# Patient Record
Sex: Male | Born: 1965 | Hispanic: Yes | Marital: Married | Smoking: Never smoker
Health system: Southern US, Community
[De-identification: ages and names within clinical notes are randomized; demographics above are authoritative.]

## PROBLEM LIST (undated history)

## (undated) HISTORY — PX: APPENDECTOMY: SHX54

---

## 2021-01-20 ENCOUNTER — Telehealth: Payer: Self-pay | Admitting: *Deleted

## 2021-01-20 NOTE — Telephone Encounter (Signed)
Per referral 01/16/21 - gave patient upcoming appts. - mailed calendar and welcome packet  -due to patient having surgery soon, moved appointment out a couple weeks.

## 2021-01-21 ENCOUNTER — Telehealth: Payer: Self-pay | Admitting: *Deleted

## 2021-01-21 ENCOUNTER — Other Ambulatory Visit: Payer: Self-pay | Admitting: Family

## 2021-01-21 DIAGNOSIS — D649 Anemia, unspecified: Secondary | ICD-10-CM

## 2021-01-21 DIAGNOSIS — D696 Thrombocytopenia, unspecified: Secondary | ICD-10-CM

## 2021-01-21 NOTE — Telephone Encounter (Signed)
Patient called per Thomas Frazier of upcoming appt. Patient confirmed

## 2021-01-22 ENCOUNTER — Other Ambulatory Visit: Payer: Self-pay | Admitting: Family

## 2021-01-22 ENCOUNTER — Telehealth: Payer: Self-pay | Admitting: Family

## 2021-01-22 ENCOUNTER — Inpatient Hospital Stay: Payer: Self-pay

## 2021-01-22 ENCOUNTER — Inpatient Hospital Stay: Payer: Self-pay | Attending: Family | Admitting: Family

## 2021-01-22 ENCOUNTER — Encounter: Payer: Self-pay | Admitting: Family

## 2021-01-22 ENCOUNTER — Other Ambulatory Visit: Payer: Self-pay

## 2021-01-22 VITALS — BP 128/62 | HR 80 | Temp 98.7°F | Resp 18 | Ht 69.0 in | Wt 167.0 lb

## 2021-01-22 DIAGNOSIS — D649 Anemia, unspecified: Secondary | ICD-10-CM

## 2021-01-22 DIAGNOSIS — D696 Thrombocytopenia, unspecified: Secondary | ICD-10-CM

## 2021-01-22 LAB — CMP (CANCER CENTER ONLY)
ALT: 25 U/L (ref 0–44)
AST: 26 U/L (ref 15–41)
Albumin: 4.5 g/dL (ref 3.5–5.0)
Alkaline Phosphatase: 86 U/L (ref 38–126)
Anion gap: 6 (ref 5–15)
BUN: 22 mg/dL — ABNORMAL HIGH (ref 6–20)
CO2: 29 mmol/L (ref 22–32)
Calcium: 10.3 mg/dL (ref 8.9–10.3)
Chloride: 102 mmol/L (ref 98–111)
Creatinine: 0.9 mg/dL (ref 0.61–1.24)
GFR, Estimated: >60 mL/min (ref >60)
Glucose, Bld: 100 mg/dL — ABNORMAL HIGH (ref 70–99)
Potassium: 4 mmol/L (ref 3.5–5.1)
Sodium: 137 mmol/L (ref 135–145)
Total Bilirubin: 0.7 mg/dL (ref 0.3–1.2)
Total Protein: 7.9 g/dL (ref 6.5–8.1)

## 2021-01-22 LAB — CBC WITH DIFFERENTIAL (CANCER CENTER ONLY)
Abs Immature Granulocytes: 0.04 10*3/uL (ref 0.00–0.07)
Basophils Absolute: 0 10*3/uL (ref 0.0–0.1)
Basophils Relative: 1 %
Eosinophils Absolute: 0.1 10*3/uL (ref 0.0–0.5)
Eosinophils Relative: 1 %
HCT: 27.5 % — ABNORMAL LOW (ref 39.0–52.0)
Hemoglobin: 8.9 g/dL — ABNORMAL LOW (ref 13.0–17.0)
Immature Granulocytes: 1 %
Lymphocytes Relative: 57 %
Lymphs Abs: 3.3 10*3/uL (ref 0.7–4.0)
MCH: 27.7 pg (ref 26.0–34.0)
MCHC: 32.4 g/dL (ref 30.0–36.0)
MCV: 85.7 fL (ref 80.0–100.0)
Monocytes Absolute: 0.8 10*3/uL (ref 0.1–1.0)
Monocytes Relative: 13 %
Neutro Abs: 1.6 10*3/uL — ABNORMAL LOW (ref 1.7–7.7)
Neutrophils Relative %: 27 %
Platelet Count: 54 10*3/uL — ABNORMAL LOW (ref 150–400)
RBC: 3.21 MIL/uL — ABNORMAL LOW (ref 4.22–5.81)
RDW: 28.6 % — ABNORMAL HIGH (ref 11.5–15.5)
WBC Count: 5.8 10*3/uL (ref 4.0–10.5)
nRBC: 42.3 % — ABNORMAL HIGH (ref 0.0–0.2)

## 2021-01-22 LAB — RETICULOCYTES
Immature Retic Fract: 12.4 % (ref 2.3–15.9)
RBC.: 3.1 MIL/uL — ABNORMAL LOW (ref 4.22–5.81)
Retic Count, Absolute: 118 10*3/uL (ref 19.0–186.0)
Retic Ct Pct: 3.8 % — ABNORMAL HIGH (ref 0.4–3.1)

## 2021-01-22 LAB — IRON AND TIBC
Iron: 117 ug/dL (ref 42–163)
Saturation Ratios: 44 % (ref 20–55)
TIBC: 268 ug/dL (ref 202–409)
UIBC: 151 ug/dL (ref 117–376)

## 2021-01-22 LAB — SAVE SMEAR(SSMR), FOR PROVIDER SLIDE REVIEW

## 2021-01-22 LAB — VITAMIN B12: Vitamin B-12: 607 pg/mL (ref 180–914)

## 2021-01-22 LAB — FERRITIN: Ferritin: 445 ng/mL — ABNORMAL HIGH (ref 24–336)

## 2021-01-22 LAB — FOLATE: Folate: 32 ng/mL (ref >5.9)

## 2021-01-22 LAB — LACTATE DEHYDROGENASE: LDH: 430 U/L — ABNORMAL HIGH (ref 98–192)

## 2021-01-22 LAB — PLATELET BY CITRATE

## 2021-01-22 NOTE — Telephone Encounter (Signed)
Called and LMVM for patient regarding appointments scheduled for 3/16 per 2/23 los

## 2021-01-22 NOTE — Progress Notes (Signed)
Hematology/Oncology Consultation   Name: Thomas Frazier      MRN: [id number]    Location: Room/bed info not found  Date: 01/22/2021 Time:9:56 AM   REFERRING PHYSICIAN: Brantley Stage, PA-C  REASON FOR CONSULT: Anemia unspecified   DIAGNOSIS: Anemia and thrombocytopenia   HISTORY OF PRESENT ILLNESS: Thomas Frazier is a very pleasant 55 yo Hispanic gentleman with recent anemia and thrombocytopenia on routine blood work prior to planned inguinal hernia repair.  Platelets were 60, Hgb 10.4, MCV 89 and WBC count 7.3.  Hgb today is 8.9, MCV 85, platelets 54 and WBC count 5.8.  He denies fatigue.  He has not noted any blood loss. No bruising or petechiae.  No known family history of blood abnormalities or cancer.  No known liver or spleen issues.  He states that he does not sleep well through the night.  No fever, chills, n/v, cough, rash, dizziness, SOB, chest pain, palpitations, abdominal pain/bloating or changes in bowel or bladder habits.  No adenopathy noted on exam.  No hot flashes or night sweats.  He has occasional pain in the back and in his sides which he states is exacerbated by the cold weather. He has occasional positional numbness and tingling in his right fingers that resolves with movement.  No swelling in his extremities.  No falls or syncope to report.  He does not smoke or use recreational drugs.  He states that he rarely has a small glass of wine.  He states that his appetite comes and goes. He does not eat pork. He mostly eats chicken or fish with vegetables. He is doing his best to stay well hydrated with orange juice but admits that he needs more water throughout the day. His weight is stable.  He is prediabetic and is addressing with the above dietary changes and some exercise.  No history of thyroid disease.  He is originally from [place] and has been living the states since the 1990's.  He has 3 sons and live at home with his wife and youngest son.  He works as an  Clinical biochemist.    ROS: All other 10 point review of systems is negative.   PAST MEDICAL HISTORY:   No past medical history on file.  ALLERGIES: Not on File    MEDICATIONS:  No current outpatient medications on file prior to visit.   No current facility-administered medications on file prior to visit.     PAST SURGICAL HISTORY DIscussed with patient.  FAMILY HISTORY: No family history on file.  SOCIAL HISTORY:  has no history on file for tobacco use, alcohol use, and drug use.  PERFORMANCE STATUS: The patient's performance status is 1 - Symptomatic but completely ambulatory  PHYSICAL EXAM: Most Recent Vital Signs: There were no vitals taken for this visit. There were no vitals taken for this visit.  General Appearance:    Alert, cooperative, no distress, appears stated age  Head:    Normocephalic, without obvious abnormality, atraumatic  Eyes:    PERRL, conjunctiva/corneas clear, EOM's intact, fundi    benign, both eyes        Throat:   Lips, mucosa, and tongue normal; teeth and gums normal  Neck:   Supple, symmetrical, trachea midline, no adenopathy;    thyroid:  no enlargement/tenderness/nodules; no carotid   bruit or JVD  Back:     Symmetric, no curvature, ROM normal, no CVA tenderness  Lungs:     Clear to auscultation bilaterally, respirations unlabored  Chest Wall:  No tenderness or deformity   Heart:    Regular rate and rhythm, S1 and S2 normal, no murmur, rub   or gallop     Abdomen:     Soft, non-tender, bowel sounds active all four quadrants,    no masses, no organomegaly        Extremities:   Extremities normal, atraumatic, no cyanosis or edema  Pulses:   2+ and symmetric all extremities  Skin:   Skin color, texture, turgor normal, no rashes or lesions  Lymph nodes:   Cervical, supraclavicular, and axillary nodes normal  Neurologic:   CNII-XII intact, normal strength, sensation and reflexes    throughout    LABORATORY DATA:  No results found for  this or any previous visit (from the past 48 hour(s)).    RADIOGRAPHY: No results found.     PATHOLOGY: None   ASSESSMENT/PLAN: Thomas Frazier is a very pleasant 55 yo Hispanic gentleman with recent anemia and thrombocytopenia on routine blood work prior to planned inguinal hernia repair.  Blood smear was reviewed by Dr. Marin Olp and many nucleated red blood cells were noted. At this time we will get him set up for bone marrow biopsy within the week as well as abdominal US to assess the liver and spleen. Surgery is on hold for now.  Patient understands and is in agreement with the plan.  Once results are available, we will determine plan of care.   All questions were answered. He was encouraged to contact our office with any questions or concerns. We can certainly see him sooner if needed.   He was discussed with and also seen by Dr. Marin Olp and he is in agreement with the aforementioned.   Laverna Peace, NP

## 2021-01-23 ENCOUNTER — Ambulatory Visit (HOSPITAL_BASED_OUTPATIENT_CLINIC_OR_DEPARTMENT_OTHER)
Admission: RE | Admit: 2021-01-23 | Discharge: 2021-01-23 | Disposition: A | Discharge status: Home / Self Care | Payer: Self-pay | Source: Ambulatory Visit | Attending: Family | Admitting: Family

## 2021-01-23 DIAGNOSIS — D649 Anemia, unspecified: Secondary | ICD-10-CM

## 2021-01-23 DIAGNOSIS — D696 Thrombocytopenia, unspecified: Secondary | ICD-10-CM

## 2021-01-23 LAB — ERYTHROPOIETIN: Erythropoietin: 20.9 m[IU]/mL — ABNORMAL HIGH (ref 2.6–18.5)

## 2021-01-23 NOTE — Progress Notes (Signed)
Received message to add for bone marrow with Wilber Bihari, NP for 2/25. Donita Brooks called patient and instructed to arrive at [facility] at [facility] for 0730 appt. Flow Cytometry notified.

## 2021-01-24 ENCOUNTER — Other Ambulatory Visit: Payer: Self-pay

## 2021-01-24 ENCOUNTER — Inpatient Hospital Stay: Payer: Self-pay

## 2021-01-24 ENCOUNTER — Inpatient Hospital Stay (HOSPITAL_BASED_OUTPATIENT_CLINIC_OR_DEPARTMENT_OTHER): Payer: Self-pay | Admitting: Adult Health

## 2021-01-24 VITALS — BP 108/52 | HR 79 | Temp 97.9°F | Resp 18

## 2021-01-24 DIAGNOSIS — D696 Thrombocytopenia, unspecified: Secondary | ICD-10-CM

## 2021-01-24 LAB — CBC WITH DIFFERENTIAL (CANCER CENTER ONLY)
Abs Immature Granulocytes: 0.04 10*3/uL (ref 0.00–0.07)
Basophils Absolute: 0 10*3/uL (ref 0.0–0.1)
Basophils Relative: 1 %
Eosinophils Absolute: 0.1 10*3/uL (ref 0.0–0.5)
Eosinophils Relative: 2 %
HCT: 25.6 % — ABNORMAL LOW (ref 39.0–52.0)
Hemoglobin: 8.2 g/dL — ABNORMAL LOW (ref 13.0–17.0)
Immature Granulocytes: 1 %
Lymphocytes Relative: 52 %
Lymphs Abs: 2.5 10*3/uL (ref 0.7–4.0)
MCH: 27.8 pg (ref 26.0–34.0)
MCHC: 32 g/dL (ref 30.0–36.0)
MCV: 86.8 fL (ref 80.0–100.0)
Monocytes Absolute: 0.7 10*3/uL (ref 0.1–1.0)
Monocytes Relative: 15 %
Neutro Abs: 1.3 10*3/uL — ABNORMAL LOW (ref 1.7–7.7)
Neutrophils Relative %: 29 %
Platelet Count: 47 10*3/uL — ABNORMAL LOW (ref 150–400)
RBC: 2.95 MIL/uL — ABNORMAL LOW (ref 4.22–5.81)
RDW: 29.6 % — ABNORMAL HIGH (ref 11.5–15.5)
WBC Count: 4.6 10*3/uL (ref 4.0–10.5)
nRBC: 39 % — ABNORMAL HIGH (ref 0.0–0.2)

## 2021-01-24 LAB — HGB FRACTIONATION CASCADE
Hgb A2: 2.3 % (ref 1.8–3.2)
Hgb A: 97.1 % (ref 96.4–98.8)
Hgb F: 0.6 % (ref 0.0–2.0)
Hgb S: 0 %

## 2021-01-24 MED ORDER — LIDOCAINE HCL 2 % IJ SOLN
INTRAMUSCULAR | Status: AC
  Filled 2021-01-24: qty 20

## 2021-01-24 NOTE — Progress Notes (Signed)
Upon departure of infusion clinic, vitals were stable and the dressing site was clean, dry and intact. Patient was ambulatory and in no distress.

## 2021-01-24 NOTE — Patient Instructions (Signed)
Aspiracin y biopsia de mdula sea en adultos, cuidados posteriores Bone Marrow Aspiration and Bone Marrow Biopsy, Adult, Care After Esta hoja le brinda informacin sobre cmo cuidarse despus del procedimiento. Su mdico tambin podr darle instrucciones ms especficas. Comunquese con el mdico si tiene problemas o preguntas. Qu puedo esperar despus del procedimiento? Despus del procedimiento, es comn [employer] siguientes sntomas:  Dolor leve y sensibilidad.  Hinchazn.  Moretones. Siga estas instrucciones en su casa: Cuidado del lugar de la puncin  Siga las instrucciones del mdico acerca del cuidado del lugar de la puncin. Asegrese de hacer lo siguiente: ? Lvese las manos con agua y [place] antes y despus de cambiar la venda (vendaje). Use desinfectante para manos si no dispone de [place] y [place]. ? Cambie el vendaje como se lo haya indicado el mdico.  Psychiatric nurse de la puncin todos los das para descartar signos de infeccin. Est atento a los siguientes signos: ? Aumento del enrojecimiento, la hinchazn o Conservation officer, historic buildings. ? Lquido o sangre. ? Calor. ? Pus o mal olor.   Actividad  Reanude sus actividades normales segn lo indicado por el mdico. Pregntele al mdico qu actividades son seguras para usted.  No levante ningn objeto que pese ms de 10libras (4,5kg) o que supere el lmite de peso que le hayan indicado, hasta que el mdico le diga que puede [place].  No conduzca durante 24horas si le administraron un sedante durante el procedimiento. Instrucciones generales  [employer] de venta libre y los recetados solamente como se lo haya indicado el mdico.  No tome baos de inmersin, no nade ni use el jacuzzi hasta que el mdico lo autorice. Pregntele al mdico si puede ducharse. Thurston Pounds solo le permitan darse baos de [place].  Si se lo indican, aplique hielo sobre la zona afectada. Para hacer esto: ? Ponga el hielo en una bolsa  plstica. ? Coloque una [employer] piel y Therapist, nutritional. ? Coloque el hielo durante 54mnutos, 2 a 3veces por da.  Concurra a todas las visitas de seguimiento como se lo haya indicado el mdico. Esto es importante.   Comunquese con un mdico si:  El dolor no se alivia con los m[employer]ynegy  Tiene fiebre.  Aumentan el enrojecimiento, la hinchazn o eConservation officer, historic buildingsalrededor del lEnvironmental consultantde la puncin.  Observa lquido o sangre que salen del lugar de la puncin.  El lugar de la puncin se siente caliente al tacto.  Tiene pus o percibe que sale mal olor del lugar de la puncin. Resumen  Despus del procedimiento, es normal tener dolor leve, sensibilidad, hinchazn y moretones.  Siga las instrucciones del mdico acerca de cmo cuidar el lugar de la puncin y qSander Nephewactividades son seguras para usted.  Tome los medicamentos de venta libre y los recetados solamente como se lo haya indicado el mdico.  Comunquese con un mdico si tiene algn signo de infeccin, como lquido o sangre que salen del lugar de la puncin. Esta informacin no tiene cMarine scientistel consejo del mdico. Asegrese de hacerle al mdico cualquier pregunta que tenga. Document Revised: 05/24/2019 Document Reviewed: 05/24/2019 Elsevier Patient Education  2[place]

## 2021-01-24 NOTE — Progress Notes (Signed)
INDICATION: thrombocytopenia  Brief examination was performed. ENT: adequate airway clearance Heart: regular rate and rhythm.No Murmurs Lungs: clear to auscultation, no wheezes, normal respiratory effort  Bone Marrow Biopsy and Aspiration Procedure Note   Informed consent was obtained and potential risks including bleeding, infection and pain were reviewed with the patient.  The patient's name, date of birth, identification, consent and allergies were verified prior to the start of procedure and time out was performed.  The left posterior iliac crest was chosen as the site of biopsy.  The skin was prepped with ChloraPrep.   8 cc of 2% lidocaine was used to provide local anaesthesia.   10 cc of bone marrow aspirate was obtained followed by 1cm biopsy.  Pressure was applied to the biopsy site and bandage was placed over the biopsy site. Patient was made to lie on the back for 30 mins prior to discharge.  The procedure was tolerated well. COMPLICATIONS: None BLOOD LOSS: none The patient was discharged home in stable condition with a 1 week follow up to review results.  Patient was provided with post bone marrow biopsy instructions and instructed to call if there was any bleeding or worsening pain.  Specimens sent for flow cytometry, cytogenetics and additional studies.  Signed Abbott Jasinski C Anna Beaird, NP  

## 2021-01-28 ENCOUNTER — Ambulatory Visit (HOSPITAL_COMMUNITY): Payer: Self-pay

## 2021-01-28 ENCOUNTER — Encounter: Payer: Self-pay | Admitting: *Deleted

## 2021-01-29 LAB — SURGICAL PATHOLOGY

## 2021-01-31 ENCOUNTER — Telehealth: Payer: Self-pay | Admitting: Family

## 2021-01-31 NOTE — Telephone Encounter (Signed)
I was able to speak with Thomas Frazier and go over recent bone marrow biopsy results and diagnosis of MDS. We discussed him starting Luspatercept and he is agreeable after going over mechanism of action and potential side effects. No intervention needed at this time for platelets counts per Dr. Marin Olp.  Patient has no insurance so I have reached out to our financial counseling team and they will help him with assistance.  We hope to start his injection at his follow-up on 3/16.  No other questions at this time. Patient appreciative of call.

## 2021-02-03 ENCOUNTER — Encounter: Payer: Self-pay | Admitting: Hematology & Oncology

## 2021-02-03 ENCOUNTER — Other Ambulatory Visit: Payer: Self-pay | Admitting: Family

## 2021-02-03 ENCOUNTER — Other Ambulatory Visit: Payer: Self-pay | Admitting: Hematology & Oncology

## 2021-02-03 DIAGNOSIS — D46Z Other myelodysplastic syndromes: Secondary | ICD-10-CM | POA: Insufficient documentation

## 2021-02-03 DIAGNOSIS — D461 Refractory anemia with ring sideroblasts: Secondary | ICD-10-CM

## 2021-02-03 HISTORY — DX: Refractory anemia with ring sideroblasts: D46.1

## 2021-02-03 NOTE — Progress Notes (Signed)
START ON PATHWAY REGIMEN - MDS     A cycle is every 21 days:     Luspatercept-aamt   **Always confirm dose/schedule in your pharmacy ordering system**  Patient Characteristics: Lower-Risk (IPSS-R Score ? 3.5), MDS-Ring Sideroblasts, First Line, Anemia Requiring Treatment, EPO Level > 200 WHO Disease Classification: MDS-U Bone Marrow Blasts (percent): ? 2% Cytogenetic Category: Very Good Platelets (x 10^9/L): 50 to < 100 Absolute Neutrophil Count (x 10^9/L): ? 0.8 Line of Therapy: First Line IPSS-R Risk Category: Very Low IPSS-R Risk Score: 0.5 Check here if patient's risk score was calculated prior to the International Prognostic Scoring System-Revised (IPSS-R): false Hemoglobin (g/dl): ? 10 Disease Characteristics: Anemia Requiring Treatment Patient Characteristics: EPO Level > 200 Intent of Therapy: Non-Curative / Palliative Intent, Discussed with Patient

## 2021-02-04 ENCOUNTER — Encounter (HOSPITAL_COMMUNITY): Payer: Self-pay | Admitting: Hematology & Oncology

## 2021-02-06 ENCOUNTER — Encounter (HOSPITAL_COMMUNITY): Payer: Self-pay | Admitting: Hematology & Oncology

## 2021-02-07 ENCOUNTER — Other Ambulatory Visit: Payer: Self-pay | Admitting: Family

## 2021-02-07 ENCOUNTER — Telehealth: Payer: Self-pay | Admitting: Family

## 2021-02-07 DIAGNOSIS — M898X9 Other specified disorders of bone, unspecified site: Secondary | ICD-10-CM

## 2021-02-07 DIAGNOSIS — D461 Refractory anemia with ring sideroblasts: Secondary | ICD-10-CM

## 2021-02-07 MED ORDER — TRAMADOL HCL 50 MG PO TABS: 50.0000 mg | ORAL_TABLET | Freq: Four times a day (QID) | ORAL | 0 refills | Status: DC | PRN

## 2021-02-07 NOTE — Telephone Encounter (Signed)
I was able to speak with Thomas Frazier about the pain in his legs. We will have him start taking Ultram 50-100 mg PO every 6 hours as needed for pain. He will see Dr. Marin Olp next week to discuss his new treatment regimen. He would like an interpretor to help him with the medical terminology. I reached out to our scheduler and we will make sure one is here for his visit. No other questions at this time. Patient appreciative of call.

## 2021-02-11 ENCOUNTER — Ambulatory Visit: Payer: Self-pay | Admitting: Family

## 2021-02-11 ENCOUNTER — Other Ambulatory Visit: Payer: Self-pay

## 2021-02-12 ENCOUNTER — Other Ambulatory Visit: Payer: Self-pay

## 2021-02-12 ENCOUNTER — Encounter: Payer: Self-pay | Admitting: Hematology & Oncology

## 2021-02-12 ENCOUNTER — Inpatient Hospital Stay: Payer: Self-pay | Attending: Family

## 2021-02-12 ENCOUNTER — Inpatient Hospital Stay (HOSPITAL_BASED_OUTPATIENT_CLINIC_OR_DEPARTMENT_OTHER): Payer: Self-pay | Admitting: Hematology & Oncology

## 2021-02-12 VITALS — BP 129/54 | HR 86 | Temp 98.3°F | Resp 18 | Ht 69.0 in | Wt 164.8 lb

## 2021-02-12 DIAGNOSIS — D696 Thrombocytopenia, unspecified: Secondary | ICD-10-CM

## 2021-02-12 DIAGNOSIS — D649 Anemia, unspecified: Secondary | ICD-10-CM

## 2021-02-12 DIAGNOSIS — D461 Refractory anemia with ring sideroblasts: Secondary | ICD-10-CM

## 2021-02-12 LAB — CMP (CANCER CENTER ONLY)
ALT: 29 U/L (ref 0–44)
AST: 31 U/L (ref 15–41)
Albumin: 4.4 g/dL (ref 3.5–5.0)
Alkaline Phosphatase: 72 U/L (ref 38–126)
Anion gap: 6 (ref 5–15)
BUN: 20 mg/dL (ref 6–20)
CO2: 30 mmol/L (ref 22–32)
Calcium: 10 mg/dL (ref 8.9–10.3)
Chloride: 102 mmol/L (ref 98–111)
Creatinine: 0.93 mg/dL (ref 0.61–1.24)
GFR, Estimated: >60 mL/min (ref >60)
Glucose, Bld: 107 mg/dL — ABNORMAL HIGH (ref 70–99)
Potassium: 4.4 mmol/L (ref 3.5–5.1)
Sodium: 138 mmol/L (ref 135–145)
Total Bilirubin: 0.6 mg/dL (ref 0.3–1.2)
Total Protein: 7.5 g/dL (ref 6.5–8.1)

## 2021-02-12 LAB — CBC WITH DIFFERENTIAL (CANCER CENTER ONLY)
Abs Immature Granulocytes: 0.1 10*3/uL — ABNORMAL HIGH (ref 0.00–0.07)
Basophils Absolute: 0.1 10*3/uL (ref 0.0–0.1)
Basophils Relative: 1 %
Eosinophils Absolute: 0 10*3/uL (ref 0.0–0.5)
Eosinophils Relative: 0 %
HCT: 24.4 % — ABNORMAL LOW (ref 39.0–52.0)
Hemoglobin: 7.4 g/dL — ABNORMAL LOW (ref 13.0–17.0)
Lymphocytes Relative: 51 %
Lymphs Abs: 2.7 10*3/uL (ref 0.7–4.0)
MCH: 25.7 pg — ABNORMAL LOW (ref 26.0–34.0)
MCHC: 30.3 g/dL (ref 30.0–36.0)
MCV: 84.7 fL (ref 80.0–100.0)
Metamyelocytes Relative: 1 %
Monocytes Absolute: 0.5 10*3/uL (ref 0.1–1.0)
Monocytes Relative: 10 %
Myelocytes: 1 %
Neutro Abs: 1.9 10*3/uL (ref 1.7–7.7)
Neutrophils Relative %: 36 %
Platelet Count: 44 10*3/uL — ABNORMAL LOW (ref 150–400)
RBC: 2.88 MIL/uL — ABNORMAL LOW (ref 4.22–5.81)
RDW: 33.2 % — ABNORMAL HIGH (ref 11.5–15.5)
WBC Count: 5.3 10*3/uL (ref 4.0–10.5)
nRBC: 54.8 % — ABNORMAL HIGH (ref 0.0–0.2)

## 2021-02-12 LAB — PREPARE RBC (CROSSMATCH)

## 2021-02-12 LAB — PLATELET BY CITRATE

## 2021-02-12 LAB — SAMPLE TO BLOOD BANK

## 2021-02-12 LAB — LACTATE DEHYDROGENASE: LDH: 450 U/L — ABNORMAL HIGH (ref 98–192)

## 2021-02-12 LAB — SAVE SMEAR(SSMR), FOR PROVIDER SLIDE REVIEW

## 2021-02-12 NOTE — Progress Notes (Signed)
Hematology and Oncology Follow Up Visit  Thomas Frazier [id number] 04-17-1966 55 y.o. 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  Start on 02/17/2021     Interim History:  Thomas Frazier is back for follow-up.  This is a second office visit.  We did go ahead and get a bone marrow biopsy on him.  This was done on 01/28/2021.  The pathology report ([id number]) showed that he had a hypercellular marrow with trilineage dysplasia.  He did have an increase in ringed sideroblasts.  There were less than 5% blasts.  The real problem was his cytogenetics.  He has an incredibly complex cytogenetic profile.  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  I really believe this will put him at high risk for leukemic transformation.  I believe that we are going to have to treat him with Vidaza.  I do think that he is a transplant candidate.  I think we can get a good response with Vidaza, we might be able to get him to a stem cell transplant.  He does speak [place] although he does have a Optometrist which has helped quite a bit.  He is anemic today.  He is more thrombocytopenic.  He does have some nosebleeds.  He did have a ultrasound of the abdomen on 01/23/2021.  He had a little bit of a fatty liver.  Otherwise, there is no other abnormalities.  There is no splenomegaly.  I do think he is going need to be transfused.  He needs 2 units of blood.  He has had no fever.  He has had no obvious change in bowel or bladder habits.  His appetite is good.  Currently, I would say his performance status is probably ECOG 1.  Medications:  Current Outpatient Medications:  .  traMADol (ULTRAM) 50 MG tablet, Take 1-2 tablets (50-100 mg total) by mouth every 6 (six)  hours as needed., Disp: 60 tablet, Rfl: 0 .  Multiple Vitamins-Iron (MULTIVITAMIN/IRON PO), Take 1 tablet by mouth daily. (Patient not taking: Reported on 02/12/2021), Disp: , Rfl:   Allergies: No Known Allergies  Past Medical History, Surgical history, Social history, and Family History were reviewed and updated.  Review of Systems: Review of Systems  HENT:   Positive for nosebleeds.   Eyes: Negative.   Respiratory: Positive for chest tightness.   Cardiovascular: Negative.   Endocrine: Negative.   Genitourinary: Negative.    Musculoskeletal: Positive for arthralgias, back pain and myalgias.  Skin: Negative.   Neurological: Negative.   Hematological: Negative.   Psychiatric/Behavioral: Negative.     Physical Exam:  height is _0  (1.753 m) and weight is 164 lb 12.8 oz (74.8 kg). His oral temperature is 98.3 F (36.8 C). His blood pressure is 129/54 (abnormal) and his pulse is 86. His respiration is 18 and oxygen saturation is 100%.   Wt Readings from Last 3 Encounters:  02/12/21 164 lb 12.8 oz (74.8 kg)  01/22/21 167 lb (75.8 kg)    Physical Exam Vitals reviewed.  HENT:     Head: Normocephalic and atraumatic.  Eyes:     Pupils: Pupils are equal, round, and reactive to light.  Cardiovascular:     Rate and Rhythm: Normal rate and regular rhythm.  Heart sounds: Normal heart sounds.  Pulmonary:     Effort: Pulmonary effort is normal.     Breath sounds: Normal breath sounds.  Abdominal:     General: Bowel sounds are normal.     Palpations: Abdomen is soft.  Musculoskeletal:        General: No tenderness or deformity. Normal range of motion.     Cervical back: Normal range of motion.  Lymphadenopathy:     Cervical: No cervical adenopathy.  Skin:    General: Skin is warm and dry.     Findings: No erythema or rash.  Neurological:     Mental Status: He is alert and oriented to person, place, and time.  Psychiatric:        Behavior: Behavior normal.        Thought  Content: Thought content normal.        Judgment: Judgment normal.      Lab Results  Component Value Date   WBC 5.3 02/12/2021   HGB 7.4 (L) 02/12/2021   HCT 24.4 (L) 02/12/2021   MCV 84.7 02/12/2021   PLT 44 (L) 02/12/2021     Chemistry      Component Value Date/Time   NA 138 02/12/2021 1103   K 4.4 02/12/2021 1103   CL 102 02/12/2021 1103   CO2 30 02/12/2021 1103   BUN 20 02/12/2021 1103   CREATININE 0.93 02/12/2021 1103      Component Value Date/Time   CALCIUM 10.0 02/12/2021 1103   ALKPHOS 72 02/12/2021 1103   AST 31 02/12/2021 1103   ALT 29 02/12/2021 1103   BILITOT 0.6 02/12/2021 1103      Impression and Plan: Thomas Frazier is a very nice 55 year old Hispanic male.  He has high-grade myelodysplasia.  This is evidenced by the complex cytogenetics that he has.  We really need to treat this aggressively.  Again, I think that Vidaza would be a reasonable for him.  We will try him on Vidaza.  I told him that Vidaza will be given under the skin.  I will give him 5 days of Vidaza.  Each treatment interval will be 28 days.  I went over the side effects.  I think that he will tolerate this okay.  I am not sure how well he will be able to work however.  If he has problems at work, we will be more than happy to write him a letter to get him out of work.  Clearly, his health is what is important.  He is young.  He is in good health.  We want him to be able to have a productive long life.  I do think that he will ultimately need a bone marrow transplant in order to attain remission.  Again, we will give him a transfusion tomorrow.  We will give him 2 units of packed red blood cells.  He does not need any platelets.  We will start the [place] on 02/17/2021.  Again, this is much more complicated than I had originally thought.  Again, the cytogenetic abnormalities really are the "driver" for our treatment recommendations.   [name], MD 3/16/20224:53 PM

## 2021-02-12 NOTE — Progress Notes (Signed)
DISCONTINUE ON PATHWAY REGIMEN - MDS     A cycle is every 21 days:     Luspatercept-aamt   **Always confirm dose/schedule in your pharmacy ordering system**  REASON: Other Reason PRIOR TREATMENT: [id number]: Luspatercept 1 mg/kg q21 Days TREATMENT RESPONSE: Unable to Evaluate  START ON PATHWAY REGIMEN - MDS     A cycle is every 28 days:     Azacitidine   **Always confirm dose/schedule in your pharmacy ordering system**  Patient Characteristics: Higher-Risk (IPSS-R Score > 3.5), First Line, Transplant Candidate WHO Disease Classification: MDS-U Bone Marrow Blasts (percent): > 2% to < 5% Cytogenetic Category: Poor Platelets (x 10^9/L): < 50 Absolute Neutrophil Count (x 10^9/L): ? 0.8 Line of Therapy: First Line IPSS-R Risk Category: Very High IPSS-R Risk Score: 6.5 Check here if patient's risk score was calculated prior to the International Prognostic Scoring System-Revised (IPSS-R): false Hemoglobin (g/dl): < 8 Patient Characteristics: Transplant Candidate Intent of Therapy: Curative Intent, Discussed with Patient

## 2021-02-13 ENCOUNTER — Inpatient Hospital Stay: Payer: Self-pay

## 2021-02-13 ENCOUNTER — Telehealth: Payer: Self-pay

## 2021-02-13 ENCOUNTER — Other Ambulatory Visit: Payer: Self-pay | Admitting: Hematology & Oncology

## 2021-02-13 DIAGNOSIS — D696 Thrombocytopenia, unspecified: Secondary | ICD-10-CM

## 2021-02-13 DIAGNOSIS — D649 Anemia, unspecified: Secondary | ICD-10-CM

## 2021-02-13 DIAGNOSIS — D461 Refractory anemia with ring sideroblasts: Secondary | ICD-10-CM

## 2021-02-13 LAB — ABO/RH: ABO/RH(D): O POS

## 2021-02-13 NOTE — Patient Instructions (Signed)
https://www.redcrossblood.org/donate-blood/blood-donation-process/what-happens-to-donated-blood/blood-transfusions/types-of-blood-transfusions.html"> https://www.hematology.org/education/patients/blood-basics/blood-safety-and-matching"> https://www.nhlbi.nih.gov/health-topics/blood-transfusion">  Transfusin de [employer] adultos Blood Transfusion, Adult Una transfusin de sangre es un procedimiento en el que se recibe sangre o un tipo de clulas sanguneas (componentes sanguneos) a travs de una va intravenosa. Es posible que necesite una transfusin de sangre cuando su nivel de sangre sea [place]. Esto puede deberse a un trastorno hemorrgico, una enfermedad, lesin o [place]. La sangre puede provenir de un donante. Tambin se puede donar sangre para uno mismo (donacin de [place]) antes de [place] programada. La sangre administrada en una transfusin est formada por diferentes componentes sanguneos. Puede recibir lo siguiente:  Glbulos rojos. Transportan el oxgeno a las clulas del organismo.  Plaquetas. Ayudan a la Musician.  Plasma. Esta es la parte lquida de la [place]. Transporta protenas y otras sustancias a todo el cuerpo.  Glbulos blancos. Ayudan a combatir las infecciones. Si tiene hemofilia u otro trastorno de [place], tambin puede recibir otro tipo de hemoderivados. Informe al mdico acerca de lo siguiente:  Cualquier trastorno de la sangre que tenga.  Cualquier reaccin que haya tenido durante una transfusin de [place].  Cualquier alergia que tenga.  Todos los [employer], incluidos vitaminas, hierbas, gotas oftlmicas, cremas y medicamentos de venta libre.  Cirugas a las que se haya sometido.  Cualquier afeccin que tenga, incluidos sntomas recientes de resfriado o fiebre.  Si est embarazada o podra estarlo. Cules son los riesgos? En general, se trata de un procedimiento seguro. Sin embargo, pueden ocurrir  complicaciones.  Los problemas ms frecuentes son: ? Una reaccin alrgica leve, como zonas de piel hinchadas y enrojecidas (urticaria) y picazn. ? Fiebre o escalofros. Esta puede ser la respuesta del cuerpo a las nuevas clulas sanguneas recibidas. Puede ocurrir [place] 4 horas despus de la transfusin.  Los problemas ms graves pueden incluir: ? Cabin crew circulatoria asociada a una transfusin (SCAT) o demasiado lquido en los pulmones. Esto puede causar problemas respiratorios. ? Una reaccin alrgica grave, como dificultad para respirar o hinchazn alrededor de la cara y los labios. ? Lesin pulmonar aguda relacionada con una transfusin (LPART), que causa dificultades respiratorias y baja concentracin de oxgeno en la sangre. Esta lesin puede ocurrir unas horas o varios das despus de la transfusin. ? Sobrecarga de hierro. Esto puede ocurrir despus de recibir muchas transfusiones de sangre durante un perodo de [place]. ? Transmisin de infecciones o virus. Esto es poco frecuente porque la sangre del donante se analiza cuidadosamente antes de administrarla. ? Reaccin transfusional hemoltica. Esto es poco frecuente. Ocurre cuando el sistema de defensa del cuerpo (sistema inmunitario)intenta atacar a las clulas sanguneas nuevas. Los sntomas pueden incluir fiebre, escalofros, nuseas, presin arterial baja y Social research officer, government en la parte baja de la espalda o dolor de pecho. ? Enfermedad de injerto contra husped asociada a una transfusin (EICH-AT). Esto es poco frecuente. Se produce cuando las clulas del donante atacan los tejidos sanos del cuerpo del receptor. Qu ocurre antes del procedimiento? Medicamentos Consulte al mdico sobre:  Quarry manager o suspender los medicamentos que [place] habitualmente. Esto es muy importante si toma medicamentos para la diabetes o anticoagulantes.  Tomar medicamentos como aspirina e ibuprofeno. Estos medicamentos pueden tener un efecto anticoagulante  en la [place]. No tome estos medicamentos a menos que el mdico se lo indique.  Tomar medicamentos de [employer], vitaminas, hierbas y suplementos. Instrucciones generales  Siga las instrucciones del mdico respecto de las restricciones de comidas y bebidas.  Se le realizar un anlisis  de sangre para determinar su grupo sanguneo. Esto es necesario para saber qu clase de sangre aceptar su cuerpo y para que coincida con la sangre del donante.  Si se someter a Horticulturist, commercial, es posible que pueda realizar una donacin de [place]. Esto se Production designer, theatre/television/film de que necesite una transfusin.  Le controlarn la temperatura, la presin arterial y el pulso antes de la transfusin.  Si tuvo una reaccin alrgica a una transfusin en el pasado, tal vez le administren un medicamento que ayude a evitarla. Este medicamento se puede administrar por la boca (por va oral) o a travs de una va intravenosa.  Reserve un tiempo para la transfusin de [place]. Este procedimiento generalmente demora de 1 a 4 horas en completarse. Qu ocurre durante el procedimiento?  Le colocarn una va intravenosa en una vena.  La bolsa de sangre donada se conectar a la va intravenosa. La sangre luego se Hotel manager en la vena.  Le controlarn la temperatura, la presin arterial y el pulso peridicamente durante la transfusin. Este control se realiza para detectar signos tempranos de una reaccin a la transfusin.  Informe de inmediato al enfermero si tiene alguno de estos sntomas durante la transfusin: ? Falta de aire o dificultad para respirar. ? Dolor de pecho o de espalda. ? Cristy Hilts o escalofros. ? Urticaria o picazn.  Si tiene signos o sntomas de Hospital doctor, se suspender la transfusin y tal vez le administren un medicamento.  Una vez finalizada la transfusin, le retirarn la va intravenosa.  Se puede aplicar presin en el lugar donde se coloc la va intravenosa.  Le  colocarn una venda (vendaje). El procedimiento puede variar segn el mdico y el hospital.   Sander Nephew ocurre despus del procedimiento?  Le controlarn la temperatura, la presin arterial, el pulso, la frecuencia respiratoria y Retail buyer de oxgeno en la sangre hasta que le den el alta del hospital o la clnica.  Le analizarn su sangre para saber cmo est respondiendo a la transfusin.  Es posible que lo calienten con lquidos o mantas para Theatre manager una Firefighter normal.  Si recibe la transfusin de sangre en un entorno ambulatorio, le indicarn con quin debe ponerse en contacto para informar cualquier reaccin. Dnde buscar ms informacin Para obtener ms informacin sobre transfusiones de [place], visite la [employer] [place]): [email or web address].org Resumen  Una transfusin de sangre es un procedimiento en el que se recibe sangre o un tipo de clulas sanguneas (componentes sanguneos) a travs de una va intravenosa.  La sangre que recibe puede provenir de un donante o de usted mismo (donacin de sangre Youth worker) antes de una ciruga programada.  La sangre administrada en una transfusin est formada por diferentes componentes sanguneos. Puede recibir glbulos rojos, plaquetas, plasma o glbulos blancos, segn la afeccin que se vaya a tratar.  Le controlarn la temperatura, la presin arterial y el pulso antes, durante y despus de la transfusin.  Despus de la transfusin, pueden analizarle la sangre para determinar cmo ha respondido su organismo. Esta informacin no tiene Marine scientist el consejo del mdico. Asegrese de hacerle al mdico cualquier pregunta que tenga. Document Revised: 07/05/2019 Document Reviewed: 07/05/2019 Elsevier Patient Education  2021 [employer].

## 2021-02-13 NOTE — Telephone Encounter (Signed)
No 02/12/21 LOS    Katrina Daddona 

## 2021-02-14 ENCOUNTER — Encounter (HOSPITAL_COMMUNITY): Payer: Self-pay | Admitting: Hematology & Oncology

## 2021-02-14 LAB — TYPE AND SCREEN
ABO/RH(D): O POS
Antibody Screen: NEGATIVE
Unit division: 0
Unit division: 0

## 2021-02-14 LAB — BPAM RBC
Blood Product Expiration Date: 202204122359
Blood Product Expiration Date: 202204142359
ISSUE DATE / TIME: 202203171035
ISSUE DATE / TIME: 202203171035
Unit Type and Rh: 5100
Unit Type and Rh: 5100

## 2021-02-17 ENCOUNTER — Other Ambulatory Visit: Payer: Self-pay | Admitting: *Deleted

## 2021-02-17 LAB — SURGICAL PATHOLOGY

## 2021-02-19 ENCOUNTER — Other Ambulatory Visit: Payer: Self-pay | Admitting: *Deleted

## 2021-02-19 MED ORDER — AZACITIDINE CHEMO INJECTION 100 MG: INTRAMUSCULAR | 0 refills | Status: DC

## 2021-02-20 ENCOUNTER — Encounter: Payer: Self-pay | Admitting: *Deleted

## 2021-02-24 ENCOUNTER — Other Ambulatory Visit: Payer: Self-pay

## 2021-02-24 ENCOUNTER — Ambulatory Visit: Payer: Self-pay | Admitting: Family

## 2021-02-25 ENCOUNTER — Telehealth: Payer: Self-pay

## 2021-02-25 ENCOUNTER — Telehealth: Payer: Self-pay | Admitting: *Deleted

## 2021-02-25 NOTE — Telephone Encounter (Signed)
Opened in error

## 2021-02-25 NOTE — Telephone Encounter (Signed)
Called pt per sch message and he is aware of his appts    Thomas Frazier

## 2021-02-27 ENCOUNTER — Other Ambulatory Visit: Payer: Self-pay | Admitting: *Deleted

## 2021-02-27 ENCOUNTER — Telehealth: Payer: Self-pay

## 2021-02-27 ENCOUNTER — Other Ambulatory Visit: Payer: Self-pay | Admitting: Hematology & Oncology

## 2021-02-27 DIAGNOSIS — D46Z Other myelodysplastic syndromes: Secondary | ICD-10-CM

## 2021-02-27 MED ORDER — ONDANSETRON HCL 8 MG PO TABS: 8.0000 mg | ORAL_TABLET | Freq: Two times a day (BID) | ORAL | 1 refills | Status: DC | PRN

## 2021-02-27 MED ORDER — PROCHLORPERAZINE MALEATE 10 MG PO TABS: 10.0000 mg | ORAL_TABLET | Freq: Four times a day (QID) | ORAL | 1 refills | Status: DC | PRN

## 2021-02-27 NOTE — Telephone Encounter (Signed)
03/21/21 appts made per 02/27/21 sch/staff message.  Pt to gain sch at 03/03/21 tx   Thomas Frazier

## 2021-02-27 NOTE — Addendum Note (Signed)
Addended by: Burney Gauze R on: 02/27/2021 11:30 AM   Modules accepted: Orders

## 2021-03-03 ENCOUNTER — Inpatient Hospital Stay: Payer: Self-pay

## 2021-03-03 ENCOUNTER — Other Ambulatory Visit: Payer: Self-pay

## 2021-03-03 ENCOUNTER — Inpatient Hospital Stay: Payer: Self-pay | Attending: Family

## 2021-03-03 VITALS — BP 117/62 | HR 86 | Temp 98.5°F | Resp 18

## 2021-03-03 DIAGNOSIS — R059 Cough, unspecified: Secondary | ICD-10-CM | POA: Insufficient documentation

## 2021-03-03 DIAGNOSIS — D46Z Other myelodysplastic syndromes: Secondary | ICD-10-CM

## 2021-03-03 DIAGNOSIS — Z883 Allergy status to other anti-infective agents status: Secondary | ICD-10-CM | POA: Insufficient documentation

## 2021-03-03 DIAGNOSIS — D461 Refractory anemia with ring sideroblasts: Secondary | ICD-10-CM | POA: Insufficient documentation

## 2021-03-03 DIAGNOSIS — M791 Myalgia, unspecified site: Secondary | ICD-10-CM | POA: Insufficient documentation

## 2021-03-03 DIAGNOSIS — Z881 Allergy status to other antibiotic agents status: Secondary | ICD-10-CM | POA: Insufficient documentation

## 2021-03-03 DIAGNOSIS — R531 Weakness: Secondary | ICD-10-CM | POA: Insufficient documentation

## 2021-03-03 DIAGNOSIS — R0602 Shortness of breath: Secondary | ICD-10-CM | POA: Insufficient documentation

## 2021-03-03 DIAGNOSIS — D696 Thrombocytopenia, unspecified: Secondary | ICD-10-CM | POA: Insufficient documentation

## 2021-03-03 DIAGNOSIS — R0789 Other chest pain: Secondary | ICD-10-CM | POA: Insufficient documentation

## 2021-03-03 DIAGNOSIS — R197 Diarrhea, unspecified: Secondary | ICD-10-CM | POA: Insufficient documentation

## 2021-03-03 DIAGNOSIS — Z5111 Encounter for antineoplastic chemotherapy: Secondary | ICD-10-CM | POA: Insufficient documentation

## 2021-03-03 DIAGNOSIS — M549 Dorsalgia, unspecified: Secondary | ICD-10-CM | POA: Insufficient documentation

## 2021-03-03 DIAGNOSIS — D849 Immunodeficiency, unspecified: Secondary | ICD-10-CM | POA: Insufficient documentation

## 2021-03-03 DIAGNOSIS — D469 Myelodysplastic syndrome, unspecified: Secondary | ICD-10-CM | POA: Insufficient documentation

## 2021-03-03 DIAGNOSIS — M2550 Pain in unspecified joint: Secondary | ICD-10-CM | POA: Insufficient documentation

## 2021-03-03 DIAGNOSIS — R21 Rash and other nonspecific skin eruption: Secondary | ICD-10-CM | POA: Insufficient documentation

## 2021-03-03 DIAGNOSIS — Z79899 Other long term (current) drug therapy: Secondary | ICD-10-CM | POA: Insufficient documentation

## 2021-03-03 LAB — CBC WITH DIFFERENTIAL (CANCER CENTER ONLY)
Abs Immature Granulocytes: 0.04 10*3/uL (ref 0.00–0.07)
Basophils Absolute: 0 10*3/uL (ref 0.0–0.1)
Basophils Relative: 1 %
Eosinophils Absolute: 0.1 10*3/uL (ref 0.0–0.5)
Eosinophils Relative: 1 %
HCT: 25 % — ABNORMAL LOW (ref 39.0–52.0)
Hemoglobin: 7.9 g/dL — ABNORMAL LOW (ref 13.0–17.0)
Immature Granulocytes: 1 %
Lymphocytes Relative: 59 %
Lymphs Abs: 3.1 10*3/uL (ref 0.7–4.0)
MCH: 26.1 pg (ref 26.0–34.0)
MCHC: 31.6 g/dL (ref 30.0–36.0)
MCV: 82.5 fL (ref 80.0–100.0)
Monocytes Absolute: 0.8 10*3/uL (ref 0.1–1.0)
Monocytes Relative: 15 %
Neutro Abs: 1.2 10*3/uL — ABNORMAL LOW (ref 1.7–7.7)
Neutrophils Relative %: 23 %
Platelet Count: 36 10*3/uL — ABNORMAL LOW (ref 150–400)
RBC: 3.03 MIL/uL — ABNORMAL LOW (ref 4.22–5.81)
RDW: 32.3 % — ABNORMAL HIGH (ref 11.5–15.5)
WBC Count: 5.3 10*3/uL (ref 4.0–10.5)
nRBC: 53 % — ABNORMAL HIGH (ref 0.0–0.2)

## 2021-03-03 LAB — BASIC METABOLIC PANEL - CANCER CENTER ONLY
Anion gap: 4 — ABNORMAL LOW (ref 5–15)
BUN: 18 mg/dL (ref 6–20)
CO2: 30 mmol/L (ref 22–32)
Calcium: 9.8 mg/dL (ref 8.9–10.3)
Chloride: 103 mmol/L (ref 98–111)
Creatinine: 1.05 mg/dL (ref 0.61–1.24)
GFR, Estimated: >60 mL/min (ref >60)
Glucose, Bld: 109 mg/dL — ABNORMAL HIGH (ref 70–99)
Potassium: 4.5 mmol/L (ref 3.5–5.1)
Sodium: 137 mmol/L (ref 135–145)

## 2021-03-03 MED ORDER — AZACITIDINE CHEMO SQ INJECTION
75.0000 mg/m2 | Freq: Once | INTRAMUSCULAR | Status: AC
  Administered 2021-03-03: 142.5 mg via SUBCUTANEOUS
  Filled 2021-03-03: qty 5.7

## 2021-03-03 MED ORDER — ONDANSETRON HCL 8 MG PO TABS
ORAL_TABLET | ORAL | Status: AC
  Filled 2021-03-03: qty 1

## 2021-03-03 MED ORDER — ONDANSETRON HCL 8 MG PO TABS
8.0000 mg | ORAL_TABLET | Freq: Once | ORAL | Status: AC
  Administered 2021-03-03: 8 mg via ORAL

## 2021-03-03 NOTE — Progress Notes (Signed)
Ok to treat despite counts per MD  

## 2021-03-03 NOTE — Progress Notes (Signed)
Dr. Marin Olp has reviewed today's labs. Okay to treat with ANC 1.2, Hb 7.9, and pltc 36.

## 2021-03-03 NOTE — Patient Instructions (Signed)
Azacitidine suspension for injection (subcutaneous use) What is this medicine? AZACITIDINE (ay za SITE i deen) is a chemotherapy drug. This medicine reduces the growth of cancer cells and can suppress the immune system. It is used for treating myelodysplastic syndrome or some types of leukemia. This medicine may be used for other purposes; ask your health care provider or pharmacist if you have questions. COMMON BRAND NAME(S): Vidaza What should I tell my health care provider before I take this medicine? They need to know if you have any of these conditions:  kidney disease  liver disease  liver tumors  an unusual or allergic reaction to azacitidine, mannitol, other medicines, foods, dyes, or preservatives  pregnant or trying to get pregnant  breast-feeding How should I use this medicine? This medicine is for injection under the skin. It is administered in a hospital or clinic by a specially trained health care professional. Talk to your pediatrician regarding the use of this medicine in children. While this drug may be prescribed for selected conditions, precautions do apply. Overdosage: If you think you have taken too much of this medicine contact a poison control center or emergency room at once. NOTE: This medicine is only for you. Do not share this medicine with others. What if I miss a dose? It is important not to miss your dose. Call your doctor or health care professional if you are unable to keep an appointment. What may interact with this medicine? Interactions have not been studied. Give your health care provider a list of all the medicines, herbs, non-prescription drugs, or dietary supplements you use. Also tell them if you smoke, drink alcohol, or use illegal drugs. Some items may interact with your medicine. This list may not describe all possible interactions. Give your health care provider a list of all the medicines, herbs, non-prescription drugs, or dietary supplements  you use. Also tell them if you smoke, drink alcohol, or use illegal drugs. Some items may interact with your medicine. What should I watch for while using this medicine? Visit your doctor for checks on your progress. This drug may make you feel generally unwell. This is not uncommon, as chemotherapy can affect healthy cells as well as cancer cells. Report any side effects. Continue your course of treatment even though you feel ill unless your doctor tells you to stop. In some cases, you may be given additional medicines to help with side effects. Follow all directions for their use. Call your doctor or health care professional for advice if you get a fever, chills or sore throat, or other symptoms of a cold or flu. Do not treat yourself. This drug decreases your body's ability to fight infections. Try to avoid being around people who are sick. This medicine may increase your risk to bruise or bleed. Call your doctor or health care professional if you notice any unusual bleeding. You may need blood work done while you are taking this medicine. Do not become pregnant while taking this medicine and for 6 months after the last dose. Women should inform their doctor if they wish to become pregnant or think they might be pregnant. Men should not father a child while taking this medicine and for 3 months after the last dose. There is a potential for serious side effects to an unborn child. Talk to your health care professional or pharmacist for more information. Do not breast-feed an infant while taking this medicine and for 1 week after the last dose. This medicine may interfere with   the ability to have a child. Talk with your doctor or health care professional if you are concerned about your fertility. What side effects may I notice from receiving this medicine? Side effects that you should report to your doctor or health care professional as soon as possible:  allergic reactions like skin rash, itching or  hives, swelling of the face, lips, or tongue  low blood counts - this medicine may decrease the number of white blood cells, red blood cells and platelets. You may be at increased risk for infections and bleeding.  signs of infection - fever or chills, cough, sore throat, pain passing urine  signs of decreased platelets or bleeding - bruising, pinpoint red spots on the skin, black, tarry stools, blood in the urine  signs of decreased red blood cells - unusually weak or tired, fainting spells, lightheadedness  signs and symptoms of kidney injury like trouble passing urine or change in the amount of urine  signs and symptoms of liver injury like dark yellow or brown urine; general ill feeling or flu-like symptoms; light-colored stools; loss of appetite; nausea; right upper belly pain; unusually weak or tired; yellowing of the eyes or skin Side effects that usually do not require medical attention (report to your doctor or health care professional if they continue or are bothersome):  constipation  diarrhea  nausea, vomiting  pain or redness at the injection site  unusually weak or tired This list may not describe all possible side effects. Call your doctor for medical advice about side effects. You may report side effects to FDA at 1-800-FDA-1088. Where should I keep my medicine? This drug is given in a hospital or clinic and will not be stored at home. NOTE: This sheet is a summary. It may not cover all possible information. If you have questions about this medicine, talk to your doctor, pharmacist, or health care provider.  2021 Elsevier/Gold Standard (2016-12-15 14:37:51)     Azacitidine suspension for injection (subcutaneous use) Qu es este medicamento? La AZACITIDINA es un agente quimioteraputico. Este medicamento reduce el crecimiento de clulas cancerosas y puede suprimir el sistema inmunolgico. Se utiliza tambin para el tratamiento de sndrome mielodisplsticos o  algunos tipos de leucemia. Este medicamento puede ser utilizado para otros usos; si tiene alguna pregunta consulte con su proveedor de atencin mdica o con su farmacutico. Este medicamento puede ser utilizado para otros usos; si tiene alguna pregunta consulte con su proveedor de atencin mdica o con su farmacutico. MARCAS COMUNES: Vidaza Qu le debo informar a mi profesional de la salud antes de tomar este medicamento? Necesitan saber si usted presenta alguno de los siguientes problemas o situaciones: -enfermedad renal  enfermedad heptica  tumores hepticos  una reaccin alrgica o inusual a la azacitidina, al manitol, a otros medicamentos, alimentos, colorantes o conservantes  si est embarazada o buscando quedar embarazada  si est amamantando a un beb Cmo debo utilizar este medicamento? Este medicamento se administra mediante inyeccin por va subcutnea. Lo administra un profesional de la salud calificado en un hospital o en un entorno clnico. Hable con su pediatra para informarse acerca del uso de este medicamento en nios. Puede requerir atencin especial. Sobredosis: Pngase en contacto inmediatamente con un centro toxicolgico o una sala de urgencia si usted cree que haya tomado demasiado medicamento. ATENCIN: [employer] es solo para usted. No comparta este medicamento con nadie. Sobredosis: Pngase en contacto inmediatamente con un centro toxicolgico o una sala de urgencia si usted cree que haya tomado demasiado  medicamento. ATENCIN: [employer] es solo para usted. No comparta este medicamento con nadie. Qu sucede si me olvido de una dosis? Es importante no olvidar ninguna dosis. Informe a su mdico o a su profesional de la salud si no puede asistir a Photographer. Qu puede interactuar con este medicamento? No se han estudiado las interacciones. Suministre a Conservation officer, historic buildings de atencin mdica una lista de todos los [place], hierbas, medicamentos de [place], o suplementos dietticos que [place]. Dgales tambin si fuma, bebe alcohol, o utiliza drogas ilegales. Algunos elementos pueden interactuar con su medicamento. Puede ser que esta lista no menciona todas las posibles interacciones. Informe a su profesional de [employer]	[place] de [employer] productos a base de hierbas, medicamentos de [place] o suplementos nutritivos que est tomando. Si usted fuma, consume bebidas alcohlicas o si utiliza drogas ilegales, indqueselo tambin a su profesional de KB Home	[place]. Algunas sustancias pueden interactuar con su medicamento. Puede ser que esta lista no menciona todas las posibles interacciones. Informe a su profesional de [employer] Home	[place] de [employer] productos a base de hierbas, medicamentos de [place] o suplementos nutritivos que est tomando. Si usted fuma, consume bebidas alcohlicas o si utiliza drogas ilegales, indqueselo tambin a su profesional de KB Home	[place]. Algunas sustancias pueden interactuar con su medicamento. A qu debo estar atento al usar Coca-Cola? Visite a su mdico para que revise su evolucin. Este medicamento podra hacerle sentir un Nurse, mental health. Esto es normal ya que la quimioterapia puede Print production planner tanto a las clulas sanas como a las clulas cancerosas. Si presenta algn efecto secundario, infrmelo. Contine con el tratamiento aun si se siente enfermo, a menos que su mdico le indique que lo suspenda. En algunos casos, podra recibir [employer] para ayudarlo con los efectos secundarios. Siga todas las instrucciones para usarlos. Consulte a su mdico o a su profesional de la salud si tiene fiebre, escalofros o dolor de garganta, o cualquier otro sntoma de resfro o gripe. No se trate usted mismo. Este medicamento reduce la capacidad del cuerpo para combatir infecciones. Trate de no acercarse a personas que estn enfermas. Este medicamento podra aumentar el riesgo de moretones o sangrado. Consulte a su mdico o a su  profesional de la salud si observa sangrados inusuales. Usted podra necesitar realizarse [employer] de sangre mientras est usando [employer]. No debe quedar embarazada mientras est tomando este medicamento y por 6 meses despus de la ltima dosis. Las mujeres deben informar a su mdico si estn buscando quedar embarazadas o si creen que podran estar embarazadas. Los hombres no deben tener hijos mientras estn recibiendo Coca-Cola y durante 3 meses despus de la ltima dosis. Existe la posibilidad de efectos secundarios graves en un beb sin nacer. Para obtener ms informacin, hable con su profesional de la salud o su farmacutico. No debe amamantar a un beb mientras est usando este medicamento y por 1 semana despus de la ltima dosis. Este medicamento puede interferir con la capacidad de tener hijos. Hable con su mdico o su profesional de la salud si est preocupado por su fertilidad. Qu efectos secundarios puedo tener al [employer] este medicamento? Efectos secundarios que debe informar a su mdico o a Barrister's clerk de la salud tan pronto como sea posible:  Chief of Staff, como erupcin cutnea, comezn/picazn o urticarias, e hinchazn de la cara, los labios o la lengua  recuentos sanguneos bajos: este medicamento podra reducir la cantidad de glbulos blancos, glbulos rojos y plaquetas. Su riesgo de infeccin y  sangrado podra ser mayor.  signos de infeccin: fiebre o escalofros, tos, dolor de garganta, dolor al orinar  signos de disminucin en la cantidad de plaquetas o sangrado: moretones, puntos rojos en la piel, heces de color negro y aspecto alquitranado, sangre en la orina  signos de disminucin en la cantidad de glbulos rojos: cansancio o debilidad inusual, Youth worker, aturdimiento  signos y sntomas de lesin al rin, tales como dificultad para orinar o cambios en la cantidad de orina  signos y sntomas de lesin al hgado, como orina amarilla oscura o  [place]; sensacin general de estar enfermo o sntomas gripales; heces claras; prdida de apetito; nuseas; dolor en la regin abdominal superior derecha; cansancio o debilidad inusual; color amarillento de los ojos o la piel Efectos secundarios que generalmente no requieren atencin mdica (infrmelos a su mdico o a su profesional de la salud si persisten o si son molestos):  estreimiento  diarrea  nuseas, vmito  dolor o enrojecimiento en TEFL teacher de la inyeccin  cansancio o debilidad inusual Puede ser que esta lista no menciona todos los posibles efectos secundarios. Comunquese a su mdico por asesoramiento mdico [employer]. Usted puede informar los efectos secundarios a la FDA por telfono al 1-800-FDA-1088. Puede ser que esta lista no menciona todos los posibles efectos secundarios. Comunquese a su mdico por asesoramiento mdico [employer]. Usted puede informar los efectos secundarios a la FDA por telfono al 1-800-FDA-1088. Dnde debo guardar mi medicina? Este medicamento se administra en hospitales o clnicas y no necesitar guardarlo en su domicilio.ATENCIN:Este folleto es un resumen. Puede ser que no cubra toda la posible informacin. Si usted tiene preguntas acerca de esta medicina, consulte con su mdico, su farmacutico o su profesional de Technical sales engineer. ATENCIN: Este folleto es un resumen. Puede ser que no cubra toda la posible informacin. Si usted tiene preguntas acerca de esta medicina, consulte con su mdico, su farmacutico o su profesional de Technical sales engineer.  2021 Elsevier/Gold Standard (2017-01-28 00:00:00)

## 2021-03-04 ENCOUNTER — Inpatient Hospital Stay: Payer: Self-pay | Attending: Hematology & Oncology

## 2021-03-04 VITALS — BP 120/61 | HR 78 | Temp 99.0°F | Resp 17

## 2021-03-04 DIAGNOSIS — M791 Myalgia, unspecified site: Secondary | ICD-10-CM | POA: Insufficient documentation

## 2021-03-04 DIAGNOSIS — M549 Dorsalgia, unspecified: Secondary | ICD-10-CM | POA: Insufficient documentation

## 2021-03-04 DIAGNOSIS — M2550 Pain in unspecified joint: Secondary | ICD-10-CM | POA: Insufficient documentation

## 2021-03-04 DIAGNOSIS — Z79899 Other long term (current) drug therapy: Secondary | ICD-10-CM | POA: Insufficient documentation

## 2021-03-04 DIAGNOSIS — D46Z Other myelodysplastic syndromes: Secondary | ICD-10-CM | POA: Insufficient documentation

## 2021-03-04 MED ORDER — ONDANSETRON HCL 8 MG PO TABS
8.0000 mg | ORAL_TABLET | Freq: Once | ORAL | Status: AC
  Administered 2021-03-04: 8 mg via ORAL

## 2021-03-04 MED ORDER — ONDANSETRON HCL 8 MG PO TABS
ORAL_TABLET | ORAL | Status: AC
  Filled 2021-03-04: qty 1

## 2021-03-04 MED ORDER — AZACITIDINE CHEMO SQ INJECTION
75.0000 mg/m2 | Freq: Once | INTRAMUSCULAR | Status: AC
  Administered 2021-03-04: 142.5 mg via SUBCUTANEOUS
  Filled 2021-03-04: qty 5.7

## 2021-03-04 NOTE — Patient Instructions (Signed)
Azacitidine suspension for injection (subcutaneous use) What is this medicine? AZACITIDINE (ay za SITE i deen) is a chemotherapy drug. This medicine reduces the growth of cancer cells and can suppress the immune system. It is used for treating myelodysplastic syndrome or some types of leukemia. This medicine may be used for other purposes; ask your health care provider or pharmacist if you have questions. COMMON BRAND NAME(S): Vidaza What should I tell my health care provider before I take this medicine? They need to know if you have any of these conditions:  kidney disease  liver disease  liver tumors  an unusual or allergic reaction to azacitidine, mannitol, other medicines, foods, dyes, or preservatives  pregnant or trying to get pregnant  breast-feeding How should I use this medicine? This medicine is for injection under the skin. It is administered in a hospital or clinic by a specially trained health care professional. Talk to your pediatrician regarding the use of this medicine in children. While this drug may be prescribed for selected conditions, precautions do apply. Overdosage: If you think you have taken too much of this medicine contact a poison control center or emergency room at once. NOTE: This medicine is only for you. Do not share this medicine with others. What if I miss a dose? It is important not to miss your dose. Call your doctor or health care professional if you are unable to keep an appointment. What may interact with this medicine? Interactions have not been studied. Give your health care provider a list of all the medicines, herbs, non-prescription drugs, or dietary supplements you use. Also tell them if you smoke, drink alcohol, or use illegal drugs. Some items may interact with your medicine. This list may not describe all possible interactions. Give your health care provider a list of all the medicines, herbs, non-prescription drugs, or dietary supplements  you use. Also tell them if you smoke, drink alcohol, or use illegal drugs. Some items may interact with your medicine. What should I watch for while using this medicine? Visit your doctor for checks on your progress. This drug may make you feel generally unwell. This is not uncommon, as chemotherapy can affect healthy cells as well as cancer cells. Report any side effects. Continue your course of treatment even though you feel ill unless your doctor tells you to stop. In some cases, you may be given additional medicines to help with side effects. Follow all directions for their use. Call your doctor or health care professional for advice if you get a fever, chills or sore throat, or other symptoms of a cold or flu. Do not treat yourself. This drug decreases your body's ability to fight infections. Try to avoid being around people who are sick. This medicine may increase your risk to bruise or bleed. Call your doctor or health care professional if you notice any unusual bleeding. You may need blood work done while you are taking this medicine. Do not become pregnant while taking this medicine and for 6 months after the last dose. Women should inform their doctor if they wish to become pregnant or think they might be pregnant. Men should not father a child while taking this medicine and for 3 months after the last dose. There is a potential for serious side effects to an unborn child. Talk to your health care professional or pharmacist for more information. Do not breast-feed an infant while taking this medicine and for 1 week after the last dose. This medicine may interfere with   the ability to have a child. Talk with your doctor or health care professional if you are concerned about your fertility. What side effects may I notice from receiving this medicine? Side effects that you should report to your doctor or health care professional as soon as possible:  allergic reactions like skin rash, itching or  hives, swelling of the face, lips, or tongue  low blood counts - this medicine may decrease the number of white blood cells, red blood cells and platelets. You may be at increased risk for infections and bleeding.  signs of infection - fever or chills, cough, sore throat, pain passing urine  signs of decreased platelets or bleeding - bruising, pinpoint red spots on the skin, black, tarry stools, blood in the urine  signs of decreased red blood cells - unusually weak or tired, fainting spells, lightheadedness  signs and symptoms of kidney injury like trouble passing urine or change in the amount of urine  signs and symptoms of liver injury like dark yellow or brown urine; general ill feeling or flu-like symptoms; light-colored stools; loss of appetite; nausea; right upper belly pain; unusually weak or tired; yellowing of the eyes or skin Side effects that usually do not require medical attention (report to your doctor or health care professional if they continue or are bothersome):  constipation  diarrhea  nausea, vomiting  pain or redness at the injection site  unusually weak or tired This list may not describe all possible side effects. Call your doctor for medical advice about side effects. You may report side effects to FDA at 1-800-FDA-1088. Where should I keep my medicine? This drug is given in a hospital or clinic and will not be stored at home. NOTE: This sheet is a summary. It may not cover all possible information. If you have questions about this medicine, talk to your doctor, pharmacist, or health care provider.  2021 Elsevier/Gold Standard (2016-12-15 14:37:51)  

## 2021-03-05 ENCOUNTER — Inpatient Hospital Stay: Payer: Self-pay

## 2021-03-05 ENCOUNTER — Other Ambulatory Visit: Payer: Self-pay

## 2021-03-05 VITALS — BP 126/66 | HR 90 | Temp 98.6°F | Resp 18

## 2021-03-05 DIAGNOSIS — D46Z Other myelodysplastic syndromes: Secondary | ICD-10-CM

## 2021-03-05 MED ORDER — ONDANSETRON HCL 8 MG PO TABS
ORAL_TABLET | ORAL | Status: AC
  Filled 2021-03-05: qty 1

## 2021-03-05 MED ORDER — AZACITIDINE CHEMO SQ INJECTION
75.0000 mg/m2 | Freq: Once | INTRAMUSCULAR | Status: AC
  Administered 2021-03-05: 142.5 mg via SUBCUTANEOUS
  Filled 2021-03-05: qty 5.7

## 2021-03-05 MED ORDER — ONDANSETRON HCL 8 MG PO TABS
8.0000 mg | ORAL_TABLET | Freq: Once | ORAL | Status: AC
  Administered 2021-03-05: 8 mg via ORAL

## 2021-03-06 ENCOUNTER — Inpatient Hospital Stay: Payer: Self-pay

## 2021-03-06 VITALS — BP 115/55 | HR 90 | Temp 98.8°F | Resp 18

## 2021-03-06 DIAGNOSIS — D46Z Other myelodysplastic syndromes: Secondary | ICD-10-CM

## 2021-03-06 MED ORDER — AZACITIDINE CHEMO SQ INJECTION
75.0000 mg/m2 | Freq: Once | INTRAMUSCULAR | Status: AC
  Administered 2021-03-06: 142.5 mg via SUBCUTANEOUS
  Filled 2021-03-06: qty 5.7

## 2021-03-06 MED ORDER — ONDANSETRON HCL 8 MG PO TABS
ORAL_TABLET | ORAL | Status: AC
  Filled 2021-03-06: qty 1

## 2021-03-06 MED ORDER — ONDANSETRON HCL 8 MG PO TABS
8.0000 mg | ORAL_TABLET | Freq: Once | ORAL | Status: AC
Start: 2021-03-06 — End: 2021-03-06
  Administered 2021-03-06: 8 mg via ORAL

## 2021-03-06 NOTE — Patient Instructions (Signed)
Azacitidine suspension for injection (subcutaneous use) What is this medicine? AZACITIDINE (ay za SITE i deen) is a chemotherapy drug. This medicine reduces the growth of cancer cells and can suppress the immune system. It is used for treating myelodysplastic syndrome or some types of leukemia. This medicine may be used for other purposes; ask your health care provider or pharmacist if you have questions. COMMON BRAND NAME(S): Vidaza What should I tell my health care provider before I take this medicine? They need to know if you have any of these conditions:  kidney disease  liver disease  liver tumors  an unusual or allergic reaction to azacitidine, mannitol, other medicines, foods, dyes, or preservatives  pregnant or trying to get pregnant  breast-feeding How should I use this medicine? This medicine is for injection under the skin. It is administered in a hospital or clinic by a specially trained health care professional. Talk to your pediatrician regarding the use of this medicine in children. While this drug may be prescribed for selected conditions, precautions do apply. Overdosage: If you think you have taken too much of this medicine contact a poison control center or emergency room at once. NOTE: This medicine is only for you. Do not share this medicine with others. What if I miss a dose? It is important not to miss your dose. Call your doctor or health care professional if you are unable to keep an appointment. What may interact with this medicine? Interactions have not been studied. Give your health care provider a list of all the medicines, herbs, non-prescription drugs, or dietary supplements you use. Also tell them if you smoke, drink alcohol, or use illegal drugs. Some items may interact with your medicine. This list may not describe all possible interactions. Give your health care provider a list of all the medicines, herbs, non-prescription drugs, or dietary supplements  you use. Also tell them if you smoke, drink alcohol, or use illegal drugs. Some items may interact with your medicine. What should I watch for while using this medicine? Visit your doctor for checks on your progress. This drug may make you feel generally unwell. This is not uncommon, as chemotherapy can affect healthy cells as well as cancer cells. Report any side effects. Continue your course of treatment even though you feel ill unless your doctor tells you to stop. In some cases, you may be given additional medicines to help with side effects. Follow all directions for their use. Call your doctor or health care professional for advice if you get a fever, chills or sore throat, or other symptoms of a cold or flu. Do not treat yourself. This drug decreases your body's ability to fight infections. Try to avoid being around people who are sick. This medicine may increase your risk to bruise or bleed. Call your doctor or health care professional if you notice any unusual bleeding. You may need blood work done while you are taking this medicine. Do not become pregnant while taking this medicine and for 6 months after the last dose. Women should inform their doctor if they wish to become pregnant or think they might be pregnant. Men should not father a child while taking this medicine and for 3 months after the last dose. There is a potential for serious side effects to an unborn child. Talk to your health care professional or pharmacist for more information. Do not breast-feed an infant while taking this medicine and for 1 week after the last dose. This medicine may interfere with   the ability to have a child. Talk with your doctor or health care professional if you are concerned about your fertility. What side effects may I notice from receiving this medicine? Side effects that you should report to your doctor or health care professional as soon as possible:  allergic reactions like skin rash, itching or  hives, swelling of the face, lips, or tongue  low blood counts - this medicine may decrease the number of white blood cells, red blood cells and platelets. You may be at increased risk for infections and bleeding.  signs of infection - fever or chills, cough, sore throat, pain passing urine  signs of decreased platelets or bleeding - bruising, pinpoint red spots on the skin, black, tarry stools, blood in the urine  signs of decreased red blood cells - unusually weak or tired, fainting spells, lightheadedness  signs and symptoms of kidney injury like trouble passing urine or change in the amount of urine  signs and symptoms of liver injury like dark yellow or brown urine; general ill feeling or flu-like symptoms; light-colored stools; loss of appetite; nausea; right upper belly pain; unusually weak or tired; yellowing of the eyes or skin Side effects that usually do not require medical attention (report to your doctor or health care professional if they continue or are bothersome):  constipation  diarrhea  nausea, vomiting  pain or redness at the injection site  unusually weak or tired This list may not describe all possible side effects. Call your doctor for medical advice about side effects. You may report side effects to FDA at 1-800-FDA-1088. Where should I keep my medicine? This drug is given in a hospital or clinic and will not be stored at home. NOTE: This sheet is a summary. It may not cover all possible information. If you have questions about this medicine, talk to your doctor, pharmacist, or health care provider.  2021 Elsevier/Gold Standard (2016-12-15 14:37:51)  

## 2021-03-07 ENCOUNTER — Inpatient Hospital Stay: Payer: Self-pay

## 2021-03-07 ENCOUNTER — Other Ambulatory Visit: Payer: Self-pay

## 2021-03-07 VITALS — BP 117/60 | HR 87 | Temp 98.6°F | Resp 18

## 2021-03-07 DIAGNOSIS — D46Z Other myelodysplastic syndromes: Secondary | ICD-10-CM

## 2021-03-07 MED ORDER — ONDANSETRON HCL 8 MG PO TABS
8.0000 mg | ORAL_TABLET | Freq: Once | ORAL | Status: AC
Start: 2021-03-07 — End: 2021-03-07
  Administered 2021-03-07: 8 mg via ORAL

## 2021-03-07 MED ORDER — ONDANSETRON HCL 8 MG PO TABS
ORAL_TABLET | ORAL | Status: AC
  Filled 2021-03-07: qty 1

## 2021-03-07 MED ORDER — PROCHLORPERAZINE MALEATE 10 MG PO TABS
ORAL_TABLET | ORAL | Status: AC
  Filled 2021-03-07: qty 1

## 2021-03-07 MED ORDER — AZACITIDINE CHEMO SQ INJECTION
75.0000 mg/m2 | Freq: Once | INTRAMUSCULAR | Status: AC
  Administered 2021-03-07: 142.5 mg via SUBCUTANEOUS
  Filled 2021-03-07: qty 5.7

## 2021-03-07 NOTE — Patient Instructions (Signed)
Azacitidine suspension for injection (subcutaneous use) What is this medicine? AZACITIDINE (ay za SITE i deen) is a chemotherapy drug. This medicine reduces the growth of cancer cells and can suppress the immune system. It is used for treating myelodysplastic syndrome or some types of leukemia. This medicine may be used for other purposes; ask your health care provider or pharmacist if you have questions. COMMON BRAND NAME(S): Vidaza What should I tell my health care provider before I take this medicine? They need to know if you have any of these conditions:  kidney disease  liver disease  liver tumors  an unusual or allergic reaction to azacitidine, mannitol, other medicines, foods, dyes, or preservatives  pregnant or trying to get pregnant  breast-feeding How should I use this medicine? This medicine is for injection under the skin. It is administered in a hospital or clinic by a specially trained health care professional. Talk to your pediatrician regarding the use of this medicine in children. While this drug may be prescribed for selected conditions, precautions do apply. Overdosage: If you think you have taken too much of this medicine contact a poison control center or emergency room at once. NOTE: This medicine is only for you. Do not share this medicine with others. What if I miss a dose? It is important not to miss your dose. Call your doctor or health care professional if you are unable to keep an appointment. What may interact with this medicine? Interactions have not been studied. Give your health care provider a list of all the medicines, herbs, non-prescription drugs, or dietary supplements you use. Also tell them if you smoke, drink alcohol, or use illegal drugs. Some items may interact with your medicine. This list may not describe all possible interactions. Give your health care provider a list of all the medicines, herbs, non-prescription drugs, or dietary supplements  you use. Also tell them if you smoke, drink alcohol, or use illegal drugs. Some items may interact with your medicine. What should I watch for while using this medicine? Visit your doctor for checks on your progress. This drug may make you feel generally unwell. This is not uncommon, as chemotherapy can affect healthy cells as well as cancer cells. Report any side effects. Continue your course of treatment even though you feel ill unless your doctor tells you to stop. In some cases, you may be given additional medicines to help with side effects. Follow all directions for their use. Call your doctor or health care professional for advice if you get a fever, chills or sore throat, or other symptoms of a cold or flu. Do not treat yourself. This drug decreases your body's ability to fight infections. Try to avoid being around people who are sick. This medicine may increase your risk to bruise or bleed. Call your doctor or health care professional if you notice any unusual bleeding. You may need blood work done while you are taking this medicine. Do not become pregnant while taking this medicine and for 6 months after the last dose. Women should inform their doctor if they wish to become pregnant or think they might be pregnant. Men should not father a child while taking this medicine and for 3 months after the last dose. There is a potential for serious side effects to an unborn child. Talk to your health care professional or pharmacist for more information. Do not breast-feed an infant while taking this medicine and for 1 week after the last dose. This medicine may interfere with   the ability to have a child. Talk with your doctor or health care professional if you are concerned about your fertility. What side effects may I notice from receiving this medicine? Side effects that you should report to your doctor or health care professional as soon as possible:  allergic reactions like skin rash, itching or  hives, swelling of the face, lips, or tongue  low blood counts - this medicine may decrease the number of white blood cells, red blood cells and platelets. You may be at increased risk for infections and bleeding.  signs of infection - fever or chills, cough, sore throat, pain passing urine  signs of decreased platelets or bleeding - bruising, pinpoint red spots on the skin, black, tarry stools, blood in the urine  signs of decreased red blood cells - unusually weak or tired, fainting spells, lightheadedness  signs and symptoms of kidney injury like trouble passing urine or change in the amount of urine  signs and symptoms of liver injury like dark yellow or brown urine; general ill feeling or flu-like symptoms; light-colored stools; loss of appetite; nausea; right upper belly pain; unusually weak or tired; yellowing of the eyes or skin Side effects that usually do not require medical attention (report to your doctor or health care professional if they continue or are bothersome):  constipation  diarrhea  nausea, vomiting  pain or redness at the injection site  unusually weak or tired This list may not describe all possible side effects. Call your doctor for medical advice about side effects. You may report side effects to FDA at 1-800-FDA-1088. Where should I keep my medicine? This drug is given in a hospital or clinic and will not be stored at home. NOTE: This sheet is a summary. It may not cover all possible information. If you have questions about this medicine, talk to your doctor, pharmacist, or health care provider.  2021 Elsevier/Gold Standard (2016-12-15 14:37:51)  

## 2021-03-10 ENCOUNTER — Other Ambulatory Visit: Payer: Self-pay | Admitting: *Deleted

## 2021-03-10 DIAGNOSIS — D461 Refractory anemia with ring sideroblasts: Secondary | ICD-10-CM

## 2021-03-10 DIAGNOSIS — M898X9 Other specified disorders of bone, unspecified site: Secondary | ICD-10-CM

## 2021-03-10 MED ORDER — TRAMADOL HCL 50 MG PO TABS: 50.0000 mg | ORAL_TABLET | Freq: Four times a day (QID) | ORAL | 0 refills | Status: DC | PRN

## 2021-03-21 ENCOUNTER — Encounter: Payer: Self-pay | Admitting: Hematology & Oncology

## 2021-03-21 ENCOUNTER — Other Ambulatory Visit: Payer: Self-pay

## 2021-03-21 ENCOUNTER — Inpatient Hospital Stay: Payer: Self-pay

## 2021-03-21 ENCOUNTER — Telehealth: Payer: Self-pay | Admitting: *Deleted

## 2021-03-21 ENCOUNTER — Ambulatory Visit (HOSPITAL_BASED_OUTPATIENT_CLINIC_OR_DEPARTMENT_OTHER)
Admission: RE | Admit: 2021-03-21 | Discharge: 2021-03-21 | Disposition: A | Discharge status: Home / Self Care | Payer: Self-pay | Source: Ambulatory Visit | Attending: Hematology & Oncology | Admitting: Hematology & Oncology

## 2021-03-21 ENCOUNTER — Inpatient Hospital Stay (HOSPITAL_BASED_OUTPATIENT_CLINIC_OR_DEPARTMENT_OTHER): Payer: Self-pay | Admitting: Hematology & Oncology

## 2021-03-21 VITALS — BP 130/76 | HR 112 | Temp 99.3°F | Resp 20 | Wt 160.0 lb

## 2021-03-21 DIAGNOSIS — D461 Refractory anemia with ring sideroblasts: Secondary | ICD-10-CM

## 2021-03-21 DIAGNOSIS — D46Z Other myelodysplastic syndromes: Secondary | ICD-10-CM | POA: Insufficient documentation

## 2021-03-21 DIAGNOSIS — D696 Thrombocytopenia, unspecified: Secondary | ICD-10-CM

## 2021-03-21 DIAGNOSIS — D649 Anemia, unspecified: Secondary | ICD-10-CM

## 2021-03-21 LAB — CBC WITH DIFFERENTIAL (CANCER CENTER ONLY)
Abs Immature Granulocytes: 0 10*3/uL (ref 0.00–0.07)
Band Neutrophils: 4 %
Basophils Absolute: 0 10*3/uL (ref 0.0–0.1)
Basophils Relative: 0 %
Eosinophils Absolute: 0 10*3/uL (ref 0.0–0.5)
Eosinophils Relative: 0 %
HCT: 18.5 % — ABNORMAL LOW (ref 39.0–52.0)
Hemoglobin: 5.9 g/dL — CL (ref 13.0–17.0)
Lymphocytes Relative: 52 %
Lymphs Abs: 1.1 10*3/uL (ref 0.7–4.0)
MCH: 26 pg (ref 26.0–34.0)
MCHC: 31.9 g/dL (ref 30.0–36.0)
MCV: 81.5 fL (ref 80.0–100.0)
Metamyelocytes Relative: 2 %
Monocytes Absolute: 0.3 10*3/uL (ref 0.1–1.0)
Monocytes Relative: 14 %
Neutro Abs: 0.7 10*3/uL — ABNORMAL LOW (ref 1.7–7.7)
Neutrophils Relative %: 28 %
Platelet Count: 15 10*3/uL — ABNORMAL LOW (ref 150–400)
RBC: 2.27 MIL/uL — ABNORMAL LOW (ref 4.22–5.81)
RDW: 34.1 % — ABNORMAL HIGH (ref 11.5–15.5)
WBC Count: 2.2 10*3/uL — ABNORMAL LOW (ref 4.0–10.5)
nRBC: 135 % — ABNORMAL HIGH (ref 0.0–0.2)

## 2021-03-21 LAB — CMP (CANCER CENTER ONLY)
ALT: 31 U/L (ref 0–44)
AST: 26 U/L (ref 15–41)
Albumin: 3.8 g/dL (ref 3.5–5.0)
Alkaline Phosphatase: 79 U/L (ref 38–126)
Anion gap: 6 (ref 5–15)
BUN: 24 mg/dL — ABNORMAL HIGH (ref 6–20)
CO2: 26 mmol/L (ref 22–32)
Calcium: 9.4 mg/dL (ref 8.9–10.3)
Chloride: 98 mmol/L (ref 98–111)
Creatinine: 1.03 mg/dL (ref 0.61–1.24)
GFR, Estimated: >60 mL/min (ref >60)
Glucose, Bld: 136 mg/dL — ABNORMAL HIGH (ref 70–99)
Potassium: 4.2 mmol/L (ref 3.5–5.1)
Sodium: 130 mmol/L — ABNORMAL LOW (ref 135–145)
Total Bilirubin: 1 mg/dL (ref 0.3–1.2)
Total Protein: 7.5 g/dL (ref 6.5–8.1)

## 2021-03-21 LAB — SAVE SMEAR(SSMR), FOR PROVIDER SLIDE REVIEW

## 2021-03-21 LAB — LACTATE DEHYDROGENASE: LDH: 385 U/L — ABNORMAL HIGH (ref 98–192)

## 2021-03-21 LAB — PREPARE RBC (CROSSMATCH)

## 2021-03-21 LAB — SAMPLE TO BLOOD BANK

## 2021-03-21 MED ORDER — MEPERIDINE HCL 25 MG/ML IJ SOLN
12.5000 mg | Freq: Once | INTRAMUSCULAR | Status: AC
  Administered 2021-03-21: 12.5 mg via INTRAVENOUS

## 2021-03-21 MED ORDER — ACETAMINOPHEN 325 MG PO TABS
650.0000 mg | ORAL_TABLET | Freq: Once | ORAL | Status: AC
  Administered 2021-03-21: 650 mg via ORAL

## 2021-03-21 MED ORDER — DIPHENHYDRAMINE HCL 25 MG PO CAPS
ORAL_CAPSULE | ORAL | Status: AC
  Filled 2021-03-21: qty 1

## 2021-03-21 MED ORDER — SODIUM CHLORIDE 0.9% IV SOLUTION
250.0000 mL | Freq: Once | INTRAVENOUS | Status: AC
  Administered 2021-03-21: 250 mL via INTRAVENOUS
  Filled 2021-03-21: qty 250

## 2021-03-21 MED ORDER — FAMCICLOVIR 500 MG PO TABS: 500.0000 mg | ORAL_TABLET | Freq: Every day | ORAL | 6 refills | Status: DC

## 2021-03-21 MED ORDER — CIPROFLOXACIN HCL 500 MG PO TABS: 500.0000 mg | ORAL_TABLET | Freq: Every day | ORAL | 6 refills | Status: DC

## 2021-03-21 MED ORDER — MEPERIDINE HCL 25 MG/ML IJ SOLN: 12.5000 mg | Freq: Once | INTRAMUSCULAR | Status: DC

## 2021-03-21 MED ORDER — FLUCONAZOLE 100 MG PO TABS: 100.0000 mg | ORAL_TABLET | Freq: Every day | ORAL | 6 refills | Status: DC

## 2021-03-21 MED ORDER — MEPERIDINE HCL 25 MG/ML IJ SOLN
INTRAMUSCULAR | Status: AC
  Filled 2021-03-21: qty 1

## 2021-03-21 MED ORDER — DIPHENHYDRAMINE HCL 25 MG PO CAPS
25.0000 mg | ORAL_CAPSULE | Freq: Once | ORAL | Status: AC
Start: 2021-03-21 — End: 2021-03-21
  Administered 2021-03-21: 25 mg via ORAL

## 2021-03-21 NOTE — Progress Notes (Signed)
Pt. Accompanied by interpreter Verdis Frederickson from [facility].

## 2021-03-21 NOTE — Addendum Note (Signed)
Addended by: [name] on: 03/21/2021 04:53 PM   Modules accepted: Orders

## 2021-03-21 NOTE — Progress Notes (Signed)
Patient completed blood transfusion.  Started chilling and shaking.  Temp 98.6.  Dr Marin Olp here.  Ordered Demerol.  Chills quickly subsided.  Patient feels well without chills.  DCd via wheelchair

## 2021-03-21 NOTE — Telephone Encounter (Signed)
Dr. Marin Olp notified of HGB-5.9 and platelet count-15. Order received for pt to one unit of platelets and 2 units of PRBC's per Dr. Marin Olp.

## 2021-03-21 NOTE — Patient Instructions (Signed)

## 2021-03-21 NOTE — Progress Notes (Signed)
Hematology and Oncology Follow Up Visit  Thomas Frazier [id number] 02-10-1966 55 y.o. 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  S/p cycle #1 -- start on 02/17/2021     Interim History:  Thomas Frazier is back for follow-up.  He comes in after having his first cycle of Vidaza.  He is not doing all that well.  He called yesterday.  He said he was having a temperature.  I talked him about this.  He had the interpreter with him.  His temperature was about 99-100.  He says he is short of breath.  He has had a cough.  We went ahead and got a chest x-ray on him today.  The chest x-ray did not show any obvious pneumonia.  He is quite anemic.  He is having some nosebleeds.  He is thrombocytopenic.  He feels weak.  He says he cannot walk all that far before he has to stop.  Again, I suspect a lot of his issues are from the marked anemia.  His hemoglobin is only 5.9.  His platelet count is 15,000.  The white cell count is 2.2.  We may have to get him on some antibiotics as a precautionary measure.   He has had no nausea or vomiting.  There is been no issues with bowels or bladder.  I think he might have some constipation.  Overall, I have to say that his performance status is probably ECOG 1.   Medications:  Current Outpatient Medications:  .  azaCITIDine (VIDAZA) 100 MG SUSR, Vidaza 142.5 mg (rounded from 143.25 mg)=75 mg/m2 x 1.91 m2 daily times five days every twenty eight days, Disp: 1 each, Rfl: 0 .  Multiple Vitamins-Iron (MULTIVITAMIN/IRON PO), Take 1 tablet by mouth daily. (Patient not taking: Reported on 03/05/2021), Disp: , Rfl:  .  ondansetron (ZOFRAN) 8 MG tablet, Take 1 tablet (8 mg total) by mouth 2 (two) times daily as needed (Nausea or vomiting)., Disp: 30 tablet, Rfl: 1 .  prochlorperazine (COMPAZINE) 10 MG tablet,  Take 1 tablet (10 mg total) by mouth every 6 (six) hours as needed (Nausea or vomiting)., Disp: 30 tablet, Rfl: 1 .  traMADol (ULTRAM) 50 MG tablet, Take 1-2 tablets (50-100 mg total) by mouth every 6 (six) hours as needed., Disp: 60 tablet, Rfl: 0  Allergies: No Known Allergies  Past Medical History, Surgical history, Social history, and Family History were reviewed and updated.  Review of Systems: Review of Systems  HENT:   Positive for nosebleeds.   Eyes: Negative.   Respiratory: Positive for chest tightness.   Cardiovascular: Negative.   Endocrine: Negative.   Genitourinary: Negative.    Musculoskeletal: Positive for arthralgias, back pain and myalgias.  Skin: Negative.   Neurological: Negative.   Hematological: Negative.   Psychiatric/Behavioral: Negative.     Physical Exam:  weight is 160 lb (72.6 kg). His oral temperature is 99.3 F (37.4 C). His blood pressure is 130/76 and his pulse is 112 (abnormal). His respiration is 20 and oxygen saturation is 100%.   Wt Readings from Last 3 Encounters:  03/21/21 160 lb (72.6 kg)  02/12/21 164 lb 12.8 oz (74.8 kg)  01/22/21 167 lb (75.8 kg)    Physical Exam Vitals reviewed.  HENT:     Head: Normocephalic and atraumatic.  Eyes:     Pupils:  Pupils are equal, round, and reactive to light.  Cardiovascular:     Rate and Rhythm: Normal rate and regular rhythm.     Heart sounds: Normal heart sounds.  Pulmonary:     Effort: Pulmonary effort is normal.     Breath sounds: Normal breath sounds.  Abdominal:     General: Bowel sounds are normal.     Palpations: Abdomen is soft.  Musculoskeletal:        General: No tenderness or deformity. Normal range of motion.     Cervical back: Normal range of motion.  Lymphadenopathy:     Cervical: No cervical adenopathy.  Skin:    General: Skin is warm and dry.     Findings: No erythema or rash.  Neurological:     Mental Status: He is alert and oriented to person, place, and time.   Psychiatric:        Behavior: Behavior normal.        Thought Content: Thought content normal.        Judgment: Judgment normal.      Lab Results  Component Value Date   WBC 2.2 (L) 03/21/2021   HGB 5.9 (LL) 03/21/2021   HCT 18.5 (L) 03/21/2021   MCV 81.5 03/21/2021   PLT 15 (L) 03/21/2021     Chemistry      Component Value Date/Time   NA 130 (L) 03/21/2021 0748   K 4.2 03/21/2021 0748   CL 98 03/21/2021 0748   CO2 26 03/21/2021 0748   BUN 24 (H) 03/21/2021 0748   CREATININE 1.03 03/21/2021 0748      Component Value Date/Time   CALCIUM 9.4 03/21/2021 0748   ALKPHOS 79 03/21/2021 0748   AST 26 03/21/2021 0748   ALT 31 03/21/2021 0748   BILITOT 1.0 03/21/2021 0748      Impression and Plan: Thomas Frazier is a very nice 55 year old Hispanic male.  He has high-grade myelodysplasia.  This is evidenced by the complex cytogenetics that he has.  We are going to transfuse him today.  He will get 2 units of blood and 1 unit of platelets.  I would like to think that this will help make him feel better.  It should also help with the nosebleed.  I think I will get him on some antibiotics.  I just think he would benefit from this given that he is leukopenic.  I know this is a very tough problem.  Hopefully we can start making some headway with the [place].  I told him that it can take 3 months or so before we start to see the counts improve.  We will plan to get him back in a couple weeks when he gets his second cycle of treatment.  I just want to help with his quality of life.  I just feel bad that he is having a tough time right now.   [name], MD 4/22/20228:47 AM

## 2021-03-22 LAB — BPAM RBC
Blood Product Expiration Date: 202205172359
Blood Product Expiration Date: 202205172359
ISSUE DATE / TIME: 202204221152
ISSUE DATE / TIME: 202204221152
Unit Type and Rh: 5100
Unit Type and Rh: 5100

## 2021-03-22 LAB — BPAM PLATELET PHERESIS
Blood Product Expiration Date: 202204222359
ISSUE DATE / TIME: 202204220855
Unit Type and Rh: 6200

## 2021-03-22 LAB — TYPE AND SCREEN
ABO/RH(D): O POS
Antibody Screen: NEGATIVE
Unit division: 0
Unit division: 0

## 2021-03-22 LAB — PREPARE PLATELET PHERESIS: Unit division: 0

## 2021-03-24 ENCOUNTER — Telehealth: Payer: Self-pay

## 2021-03-24 ENCOUNTER — Telehealth: Payer: Self-pay | Admitting: *Deleted

## 2021-03-24 NOTE — Telephone Encounter (Signed)
After review with Dr Marin Olp instructed pt to stop Famvir, Diflucan, Cipro due to rash. Pt to take Claritin until rash is gone. Message to scheduling for pt to come in for lab apt 03/26/21. Pt verbalized understanding. No further concerns.

## 2021-03-24 NOTE — Telephone Encounter (Signed)
Red dots to arms hands, feet and neck, pt states"im freezing, then hot. Since Friday. Tempeture's are stable, I wake up every hour its hard to sleep. When I walk i'm so tired. I walk 10 steps and i'm out of breath. I don't want to quit my job. Im working 5hrs a day M-F. Wil

## 2021-03-24 NOTE — Telephone Encounter (Signed)
Called pt with appt for 4/27 per sch message and also made appts per 03/21/21 los.  Pt will gain the appts for 5/2 week at 4/27 appt   Abu [place]avin

## 2021-03-26 ENCOUNTER — Encounter (HOSPITAL_BASED_OUTPATIENT_CLINIC_OR_DEPARTMENT_OTHER): Payer: Self-pay | Admitting: Student

## 2021-03-26 ENCOUNTER — Inpatient Hospital Stay (HOSPITAL_BASED_OUTPATIENT_CLINIC_OR_DEPARTMENT_OTHER)
Admission: EM | Admit: 2021-03-26 | Discharge: 2021-03-31 | DRG: 808 | Disposition: A | Discharge status: Home / Self Care | Payer: Self-pay | Attending: Family Medicine | Admitting: Family Medicine

## 2021-03-26 ENCOUNTER — Emergency Department (HOSPITAL_BASED_OUTPATIENT_CLINIC_OR_DEPARTMENT_OTHER): Payer: Self-pay

## 2021-03-26 ENCOUNTER — Inpatient Hospital Stay (HOSPITAL_BASED_OUTPATIENT_CLINIC_OR_DEPARTMENT_OTHER): Payer: Self-pay | Admitting: Hematology & Oncology

## 2021-03-26 ENCOUNTER — Inpatient Hospital Stay: Payer: Self-pay

## 2021-03-26 ENCOUNTER — Other Ambulatory Visit: Payer: Self-pay

## 2021-03-26 ENCOUNTER — Telehealth: Payer: Self-pay | Admitting: *Deleted

## 2021-03-26 ENCOUNTER — Encounter: Payer: Self-pay | Admitting: Hematology & Oncology

## 2021-03-26 DIAGNOSIS — I5041 Acute combined systolic (congestive) and diastolic (congestive) heart failure: Secondary | ICD-10-CM | POA: Insufficient documentation

## 2021-03-26 DIAGNOSIS — I248 Other forms of acute ischemic heart disease: Secondary | ICD-10-CM

## 2021-03-26 DIAGNOSIS — D61818 Other pancytopenia: Principal | ICD-10-CM

## 2021-03-26 DIAGNOSIS — Q899 Congenital malformation, unspecified: Secondary | ICD-10-CM

## 2021-03-26 DIAGNOSIS — D464 Refractory anemia, unspecified: Secondary | ICD-10-CM | POA: Diagnosis present

## 2021-03-26 DIAGNOSIS — D849 Immunodeficiency, unspecified: Secondary | ICD-10-CM

## 2021-03-26 DIAGNOSIS — I700 Atherosclerosis of aorta: Secondary | ICD-10-CM

## 2021-03-26 DIAGNOSIS — R7401 Elevation of levels of liver transaminase levels: Secondary | ICD-10-CM | POA: Diagnosis present

## 2021-03-26 DIAGNOSIS — I21A1 Myocardial infarction type 2: Secondary | ICD-10-CM | POA: Diagnosis present

## 2021-03-26 DIAGNOSIS — R21 Rash and other nonspecific skin eruption: Secondary | ICD-10-CM

## 2021-03-26 DIAGNOSIS — R0981 Nasal congestion: Secondary | ICD-10-CM | POA: Diagnosis present

## 2021-03-26 DIAGNOSIS — I5043 Acute on chronic combined systolic (congestive) and diastolic (congestive) heart failure: Secondary | ICD-10-CM | POA: Diagnosis present

## 2021-03-26 DIAGNOSIS — R7989 Other specified abnormal findings of blood chemistry: Secondary | ICD-10-CM

## 2021-03-26 DIAGNOSIS — D46Z Other myelodysplastic syndromes: Secondary | ICD-10-CM

## 2021-03-26 DIAGNOSIS — I2510 Atherosclerotic heart disease of native coronary artery without angina pectoris: Secondary | ICD-10-CM | POA: Diagnosis present

## 2021-03-26 DIAGNOSIS — I509 Heart failure, unspecified: Secondary | ICD-10-CM

## 2021-03-26 DIAGNOSIS — Z888 Allergy status to other drugs, medicaments and biological substances status: Secondary | ICD-10-CM

## 2021-03-26 DIAGNOSIS — I350 Nonrheumatic aortic (valve) stenosis: Secondary | ICD-10-CM

## 2021-03-26 DIAGNOSIS — D638 Anemia in other chronic diseases classified elsewhere: Secondary | ICD-10-CM | POA: Diagnosis present

## 2021-03-26 DIAGNOSIS — Z20822 Contact with and (suspected) exposure to covid-19: Secondary | ICD-10-CM | POA: Diagnosis present

## 2021-03-26 DIAGNOSIS — J189 Pneumonia, unspecified organism: Secondary | ICD-10-CM | POA: Diagnosis present

## 2021-03-26 DIAGNOSIS — M310 Hypersensitivity angiitis: Secondary | ICD-10-CM

## 2021-03-26 DIAGNOSIS — I2489 Other forms of acute ischemic heart disease: Secondary | ICD-10-CM

## 2021-03-26 DIAGNOSIS — Z79899 Other long term (current) drug therapy: Secondary | ICD-10-CM

## 2021-03-26 DIAGNOSIS — R040 Epistaxis: Secondary | ICD-10-CM | POA: Diagnosis not present

## 2021-03-26 DIAGNOSIS — E440 Moderate protein-calorie malnutrition: Secondary | ICD-10-CM | POA: Insufficient documentation

## 2021-03-26 DIAGNOSIS — J188 Other pneumonia, unspecified organism: Secondary | ICD-10-CM

## 2021-03-26 DIAGNOSIS — L270 Generalized skin eruption due to drugs and medicaments taken internally: Secondary | ICD-10-CM

## 2021-03-26 DIAGNOSIS — R0600 Dyspnea, unspecified: Secondary | ICD-10-CM

## 2021-03-26 DIAGNOSIS — I5031 Acute diastolic (congestive) heart failure: Secondary | ICD-10-CM

## 2021-03-26 DIAGNOSIS — L080 Pyoderma: Secondary | ICD-10-CM | POA: Diagnosis present

## 2021-03-26 DIAGNOSIS — I319 Disease of pericardium, unspecified: Secondary | ICD-10-CM | POA: Diagnosis present

## 2021-03-26 LAB — RESP PANEL BY RT-PCR (FLU A&B, COVID) ARPGX2
Influenza A by PCR: NEGATIVE
Influenza B by PCR: NEGATIVE
SARS Coronavirus 2 by RT PCR: NEGATIVE

## 2021-03-26 LAB — CMP (CANCER CENTER ONLY)
ALT: 32 U/L (ref 0–44)
AST: 21 U/L (ref 15–41)
Albumin: 3.6 g/dL (ref 3.5–5.0)
Alkaline Phosphatase: 73 U/L (ref 38–126)
Anion gap: 7 (ref 5–15)
BUN: 23 mg/dL — ABNORMAL HIGH (ref 6–20)
CO2: 28 mmol/L (ref 22–32)
Calcium: 9.6 mg/dL (ref 8.9–10.3)
Chloride: 97 mmol/L — ABNORMAL LOW (ref 98–111)
Creatinine: 1 mg/dL (ref 0.61–1.24)
GFR, Estimated: >60 mL/min (ref >60)
Glucose, Bld: 155 mg/dL — ABNORMAL HIGH (ref 70–99)
Potassium: 4.6 mmol/L (ref 3.5–5.1)
Sodium: 132 mmol/L — ABNORMAL LOW (ref 135–145)
Total Bilirubin: 1.1 mg/dL (ref 0.3–1.2)
Total Protein: 7.6 g/dL (ref 6.5–8.1)

## 2021-03-26 LAB — CBC WITH DIFFERENTIAL (CANCER CENTER ONLY)
Abs Immature Granulocytes: 0.1 10*3/uL — ABNORMAL HIGH (ref 0.00–0.07)
Band Neutrophils: 6 %
Basophils Absolute: 0 10*3/uL (ref 0.0–0.1)
Basophils Relative: 1 %
Blasts: 2 %
Eosinophils Absolute: 0 10*3/uL (ref 0.0–0.5)
Eosinophils Relative: 0 %
HCT: 23.1 % — ABNORMAL LOW (ref 39.0–52.0)
Hemoglobin: 7.6 g/dL — ABNORMAL LOW (ref 13.0–17.0)
Lymphocytes Relative: 74 %
Lymphs Abs: 1.3 10*3/uL (ref 0.7–4.0)
MCH: 27.5 pg (ref 26.0–34.0)
MCHC: 32.9 g/dL (ref 30.0–36.0)
MCV: 83.7 fL (ref 80.0–100.0)
Metamyelocytes Relative: 5 %
Monocytes Absolute: 0 10*3/uL — ABNORMAL LOW (ref 0.1–1.0)
Monocytes Relative: 2 %
Myelocytes: 3 %
Neutro Abs: 0.2 10*3/uL — CL (ref 1.7–7.7)
Neutrophils Relative %: 7 %
Platelet Count: 14 10*3/uL — ABNORMAL LOW (ref 150–400)
RBC: 2.76 MIL/uL — ABNORMAL LOW (ref 4.22–5.81)
RDW: 28.3 % — ABNORMAL HIGH (ref 11.5–15.5)
WBC Count: 1.7 10*3/uL — ABNORMAL LOW (ref 4.0–10.5)
nRBC: 88.6 % — ABNORMAL HIGH (ref 0.0–0.2)

## 2021-03-26 LAB — SAMPLE TO BLOOD BANK

## 2021-03-26 LAB — RETICULOCYTES
Immature Retic Fract: 12.8 % (ref 2.3–15.9)
RBC.: 2.7 MIL/uL — ABNORMAL LOW (ref 4.22–5.81)
Retic Count, Absolute: 48 10*3/uL (ref 19.0–186.0)
Retic Ct Pct: 1.8 % (ref 0.4–3.1)

## 2021-03-26 LAB — BRAIN NATRIURETIC PEPTIDE: B Natriuretic Peptide: 1238.3 pg/mL — ABNORMAL HIGH (ref 0.0–100.0)

## 2021-03-26 LAB — LACTIC ACID, PLASMA: Lactic Acid, Venous: 1.2 mmol/L (ref 0.5–1.9)

## 2021-03-26 LAB — SAVE SMEAR(SSMR), FOR PROVIDER SLIDE REVIEW

## 2021-03-26 LAB — MYELODYSPLASTIC SYNDROME (MDS) PANEL

## 2021-03-26 MED ORDER — VANCOMYCIN HCL 1500 MG/300ML IV SOLN
1500.0000 mg | Freq: Once | INTRAVENOUS | Status: AC
  Administered 2021-03-27: 1500 mg via INTRAVENOUS
  Filled 2021-03-26 (×2): qty 300

## 2021-03-26 MED ORDER — VANCOMYCIN HCL 1000 MG/200ML IV SOLN
1000.0000 mg | Freq: Two times a day (BID) | INTRAVENOUS | Status: DC
  Administered 2021-03-27 – 2021-03-29 (×4): 1000 mg via INTRAVENOUS
  Filled 2021-03-26 (×4): qty 200

## 2021-03-26 MED ORDER — ACETAMINOPHEN 325 MG PO TABS
650.0000 mg | ORAL_TABLET | Freq: Four times a day (QID) | ORAL | Status: DC | PRN
  Administered 2021-03-27 – 2021-03-29 (×4): 650 mg via ORAL
  Filled 2021-03-26 (×4): qty 2

## 2021-03-26 MED ORDER — IOHEXOL 350 MG/ML SOLN
100.0000 mL | Freq: Once | INTRAVENOUS | Status: AC | PRN
  Administered 2021-03-26: 100 mL via INTRAVENOUS

## 2021-03-26 MED ORDER — SODIUM CHLORIDE 0.9 % IV SOLN
2.0000 g | Freq: Three times a day (TID) | INTRAVENOUS | Status: DC
  Administered 2021-03-26 – 2021-03-29 (×8): 2 g via INTRAVENOUS
  Filled 2021-03-26 (×9): qty 2

## 2021-03-26 MED ORDER — TRAMADOL HCL 50 MG PO TABS
50.0000 mg | ORAL_TABLET | Freq: Four times a day (QID) | ORAL | Status: DC | PRN
Start: 2021-03-26 — End: 2021-03-31

## 2021-03-26 MED ORDER — ACETAMINOPHEN 650 MG RE SUPP: 650.0000 mg | Freq: Four times a day (QID) | RECTAL | Status: DC | PRN

## 2021-03-26 NOTE — Telephone Encounter (Signed)
Dr. Marin Olp notified of ANC-0.2.  No orders received at this time.

## 2021-03-26 NOTE — ED Triage Notes (Addendum)
Pt c/o scattered rash x 2 days-started after new meds from oncology-NAD-to triage in w/c-c/o pain "all over my body"-also c/o SOB x 3 days-dyspnea noted when answering triage ?s

## 2021-03-26 NOTE — Progress Notes (Signed)
Hematology and Oncology Follow Up Visit  Thomas Frazier [id number] 10-26-1966 55 y.o. 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  S/p cycle #1 -- start on 02/17/2021     Interim History:  Thomas Frazier is back for follow-up.  He is not doing well.  We just saw him about 4 5 days ago.  He was quite pancytopenic.  He got blood.  I think got some platelets.  At that time, he had some slight nasal bleeding.  We put him on some antibiotics afterwards is because of his immunocompromised state.  He comes back in today.  He is a little bit worse.  He now has this unusual rash.  It is maculopapular.  It has central eschar.  These are on his face, upper back, arms and leg.  He is short of breath.  His oxygen saturation is 100%.  We did do a chest x-ray when we saw him last week which did not show any obvious infiltrate or pneumonia.  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  This would be the worst thing that we can miss.  He is immunocompromised significantly because of his underlying myelodysplasia.  He has high-grade myelodysplasia.  He has had chemotherapy.  Again he is immunocompromised.  He is little bit of a cough.  He is not coughing up any mucus or blood.  He is not eating all that much.  He has a little bit of diarrhea.  Again, I just worry about him becoming septic.  This would certainly be quite significant and would be rapid I think given his immunocompromised state.  I believe that he is going to have to be admitted to the hospital for IV antibiotics.  Currently, I would say his performance status is ECOG 1.   Medications:  Current Outpatient Medications:  .  azaCITIDine (VIDAZA) 100 MG SUSR, Vidaza 142.5 mg (rounded from 143.25 mg)=75 mg/m2 x 1.91 m2 daily times five days every twenty eight days, Disp: 1 each,  Rfl: 0 .  ciprofloxacin (CIPRO) 500 MG tablet, Take 1 tablet (500 mg total) by mouth daily with breakfast. (Patient not taking: Reported on 03/24/2021), Disp: 14 tablet, Rfl: 6 .  famciclovir (FAMVIR) 500 MG tablet, Take 1 tablet (500 mg total) by mouth daily. (Patient not taking: Reported on 03/24/2021), Disp: 30 tablet, Rfl: 6 .  fluconazole (DIFLUCAN) 100 MG tablet, Take 1 tablet (100 mg total) by mouth daily. (Patient not taking: Reported on 03/24/2021), Disp: 30 tablet, Rfl: 6 .  Multiple Vitamins-Iron (MULTIVITAMIN/IRON PO), Take 1 tablet by mouth daily. (Patient not taking: Reported on 03/05/2021), Disp: , Rfl:  .  ondansetron (ZOFRAN) 8 MG tablet, Take 1 tablet (8 mg total) by mouth 2 (two) times daily as needed (Nausea or vomiting)., Disp: 30 tablet, Rfl: 1 .  prochlorperazine (COMPAZINE) 10 MG tablet, Take 1 tablet (10 mg total) by mouth every 6 (six) hours as needed (Nausea or vomiting)., Disp: 30 tablet, Rfl: 1 .  traMADol (ULTRAM) 50 MG tablet, Take 1-2 tablets (50-100 mg total) by mouth every 6 (six) hours as needed., Disp: 60 tablet, Rfl: 0  Allergies:  Allergies  Allergen Reactions  . Ciprofloxacin Rash  . Diflucan [Fluconazole] Rash  . Famvir [Famciclovir] Rash    Past Medical History, Surgical history, Social history, and Family History were reviewed and  updated.  Review of Systems: Review of Systems  HENT:   Positive for nosebleeds.   Eyes: Negative.   Respiratory: Positive for chest tightness.   Cardiovascular: Negative.   Endocrine: Negative.   Genitourinary: Negative.    Musculoskeletal: Positive for arthralgias, back pain and myalgias.  Skin: Negative.   Neurological: Negative.   Hematological: Negative.   Psychiatric/Behavioral: Negative.     Physical Exam:  vitals were not taken for this visit.   Wt Readings from Last 3 Encounters:  03/21/21 160 lb (72.6 kg)  02/12/21 164 lb 12.8 oz (74.8 kg)  01/22/21 167 lb (75.8 kg)    Physical Exam Vitals reviewed.   HENT:     Head: Normocephalic and atraumatic.  Eyes:     Pupils: Pupils are equal, round, and reactive to light.  Cardiovascular:     Rate and Rhythm: Normal rate and regular rhythm.     Heart sounds: Normal heart sounds.  Pulmonary:     Effort: Pulmonary effort is normal.     Breath sounds: Normal breath sounds.  Abdominal:     General: Bowel sounds are normal.     Palpations: Abdomen is soft.  Musculoskeletal:        General: No tenderness or deformity. Normal range of motion.     Cervical back: Normal range of motion.  Lymphadenopathy:     Cervical: No cervical adenopathy.  Skin:    Findings: No erythema or rash.     Comments: On his skin, he has  maculopapule lesions.  These have a central eschar.  They are somewhat tender.  There is no exudate from these.  They are on his face, arms, neck, upper chest and legs.  Neurological:     Mental Status: He is alert and oriented to person, place, and time.  Psychiatric:        Behavior: Behavior normal.        Thought Content: Thought content normal.        Judgment: Judgment normal.      Lab Results  Component Value Date   WBC 1.7 (L) 03/26/2021   HGB 7.6 (L) 03/26/2021   HCT 23.1 (L) 03/26/2021   MCV 83.7 03/26/2021   PLT 14 (L) 03/26/2021     Chemistry      Component Value Date/Time   NA 132 (L) 03/26/2021 1051   K 4.6 03/26/2021 1051   CL 97 (L) 03/26/2021 1051   CO2 28 03/26/2021 1051   BUN 23 (H) 03/26/2021 1051   CREATININE 1.00 03/26/2021 1051      Component Value Date/Time   CALCIUM 9.6 03/26/2021 1051   ALKPHOS 73 03/26/2021 1051   AST 21 03/26/2021 1051   ALT 32 03/26/2021 1051   BILITOT 1.1 03/26/2021 1051      Impression and Plan: Thomas Frazier is a very nice 55 year old Hispanic male.  He has high-grade myelodysplasia.  This is evidenced by the complex cytogenetics that he has.  Again, I really worry about him being septic.  I know he did not look that bad right now.  However, the skin lesions  really troubled me.  Again they certainly look somewhat like ecthyma gangrenosum.  I really do think that is going to have to be in the hospital.  I do think that he needs IV antibiotics for right now until we know exactly what is going on.  I think that things could go "[place]" quickly.  We will see about getting him into the hospital.  We called the ER.  I do the hospital does not have beds right now.  Hopefully he will be able to get in later on today.  We will see him in the hospital.   [name], MD 4/27/20221:53 PM

## 2021-03-26 NOTE — ED Notes (Signed)
Report given to [place] with [facility]  ETA ~ 30 minutes

## 2021-03-26 NOTE — Progress Notes (Signed)
Pharmacy Antibiotic Note  Thomas Frazier is a 55 y.o. 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  Pharmacy has been consulted for Cefepime dosing. Of note, patient has h/o high-grade myelodysplasia.  Patient transferred from [facility].  Upon admission to [facility], pharmacy consulted to dose Vancomycin for pneumonia.  WBC low at 1.7. LA 1.2. SCr wnl. AF   Plan: - Vancomycin 1500mg  IV x 1 loading dose followed by 1000 mg IV Q 12 hrs. Goal AUC 400-550.  Expected AUC: 525.1  SCr used: 1 - Cefepime 2 gm IV Q 8 hours -Monitor CBC, renal fx, cultures and clinical progress   Height: 5\' 9"  (175.3 cm) Weight: 72.6 kg (160 lb 0.9 oz) IBW/kg (Calculated) : 70.7  Temp (24hrs), Avg:99.8 F (37.7 C), Min:99 F (37.2 C), Max:100.6 F (38.1 C)  Recent Labs  Lab 03/21/21 0748 03/26/21 1051 03/26/21 1520  WBC 2.2* 1.7*  --   CREATININE 1.03 1.00  --   LATICACIDVEN  --   --  1.2    Estimated Creatinine Clearance: 82.5 mL/min (by C-G formula based on SCr of 1 mg/dL).    Allergies  Allergen Reactions  . Ciprofloxacin Rash  . Diflucan [Fluconazole] Rash  . Famvir [Famciclovir] Rash    Antimicrobials this admission: Cefepime 4/27>>  Vancomycin 4/27 >>   Dose adjustments this admission:  Microbiology results: 4/27 BCx:    Thank you for allowing pharmacy to be a part of this patient's care.  Leone Haven, PharmD 03/26/2021 @ 22:38

## 2021-03-26 NOTE — ED Notes (Signed)
Report given to [place] @ [facility]

## 2021-03-26 NOTE — Progress Notes (Signed)
Pharmacy Antibiotic Note  Thomas Frazier is a 56 y.o. 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  Pharmacy has been consulted for Cefepime dosing. Of note, patient has h/o high-grade myelodysplasia  WBC low at 1.7. LA 1.2. SCr wnl. AF   Plan: -Start Cefepime 2 gm IV Q 8 hours -Monitor CBC, renal fx, cultures and clinical progress   Height: 5\' 9"  (175.3 cm) Weight: 72.5 kg (159 lb 13.3 oz) IBW/kg (Calculated) : 70.7  Temp (24hrs), Avg:99 F (37.2 C), Min:99 F (37.2 C), Max:99 F (37.2 C)  Recent Labs  Lab 03/21/21 0748 03/26/21 1051 03/26/21 1520  WBC 2.2* 1.7*  --   CREATININE 1.03 1.00  --   LATICACIDVEN  --   --  1.2    Estimated Creatinine Clearance: 82.5 mL/min (by C-G formula based on SCr of 1 mg/dL).    Allergies  Allergen Reactions  . Ciprofloxacin Rash  . Diflucan [Fluconazole] Rash  . Famvir [Famciclovir] Rash    Antimicrobials this admission: Cefepime 4/27>>    Dose adjustments this admission:  Microbiology results: 4/27 BCx:    Thank you for allowing pharmacy to be a part of this patient's care.  Albertina Parr, PharmD., BCPS, BCCCP Clinical Pharmacist Please refer to [facility] for unit-specific pharmacist

## 2021-03-26 NOTE — H&P (Signed)
History and Physical    Thomas Frazier [id number] DOB: September 13, 1966 DOA: 03/26/2021  PCP: Cathleen Corti, PA-C  Patient coming from: Home.  Chief Complaint: Skin rash.  HPI: Thomas Frazier is a 55 y.o. male with history of myelodysplasia on chemotherapy started noticing rash on his body over the last 2 days.  The rash is present on the extremities and around the neck area.  It has a central eschar like area.  Also has been having shortness of breath.  Patient states over the last few days patient has been having nasal congestion and difficulty taking deep breaths.  Patient had followed up with his oncologist Dr. 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  ED Course: In the ER patient had a CT angiogram of the chest which shows bilateral groundglass opacities of the lung fields.  In addition patient blood work shows pancytopenia.  COVID test was negative.  Blood cultures were obtained and patient was started on empiric antibiotics and admitted.  Patient's BNP is around 1200.  High sensitive troponin was 42 and 37.  Lactic acid was normal.  Review of Systems: As per HPI, rest all negative.   Past Medical History:  Diagnosis Date  . 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 APPENDECTOMY       reports that he has never smoked. He has never used smokeless tobacco. He reports previous alcohol use. He reports that he does not use drugs.  Allergies  Allergen Reactions  . Ciprofloxacin Rash  . Diflucan [Fluconazole] Rash  . Famvir [Famciclovir] Rash    Family History  Family history unknown: Yes    Prior to Admission medications   Medication Sig Start Date End Date Taking? Authorizing Provider  azaCITIDine (VIDAZA) 100 MG SUSR Vidaza 142.5 mg (rounded from 143.25 mg)=75 mg/m2 x 1.91 m2 daily times five days every twenty eight days Patient taking differently: Inject 100 mg into  the vein See admin instructions. Vidaza 142.5 mg (rounded from 143.25 mg)=75 mg/m2 x 1.91 m2 daily times five days every twenty eight days 02/19/21  Yes Ennever, Rudell Cobb, MD  Multiple Vitamins-Iron (MULTIVITAMIN/IRON PO) Take 1 tablet by mouth daily. 01/22/21  Yes [provider]  prochlorperazine (COMPAZINE) 10 MG tablet Take 1 tablet (10 mg total) by mouth every 6 (six) hours as needed (Nausea or vomiting). Patient taking differently: Take 10 mg by mouth every 6 (six) hours as needed for nausea or vomiting (Nausea or vomiting). 02/27/21  Yes Ennever, Rudell Cobb, MD  traMADol (ULTRAM) 50 MG tablet Take 1-2 tablets (50-100 mg total) by mouth every 6 (six) hours as needed. Patient taking differently: Take 50-100 mg by mouth every 6 (six) hours as needed for severe pain. 03/10/21  Yes [name], MD  ciprofloxacin (CIPRO) 500 MG tablet Take 1 tablet (500 mg total) by mouth daily with breakfast. Patient not taking: No sig reported 03/21/21   [name], MD  famciclovir (FAMVIR) 500 MG tablet Take 1 tablet (500 mg total) by mouth daily. Patient not taking: No sig reported 03/21/21   [name], MD  fluconazole (DIFLUCAN) 100 MG tablet Take 1 tablet (100 mg total) by mouth daily. Patient not taking: No sig reported 03/21/21   [name], MD  ondansetron (ZOFRAN) 8 MG tablet Take 1 tablet (8 mg total) by mouth 2 (two) times daily as needed (Nausea or vomiting). Patient not taking: No  sig reported 02/27/21   [name], MD    Physical Exam: Constitutional: Moderately built and nourished. Vitals:   03/26/21 1800 03/26/21 1815 03/26/21 1845 03/26/21 1950  BP: 116/60 125/67 131/68 (!) 116/59  Pulse: (!) 110 (!) 110 92 100  Resp: 16  18 20   Temp:    (!) 100.6 F (38.1 C)  TempSrc:    Oral  SpO2: 96% 96% 96% 95%  Weight:    72.6 kg  Height:    5\' 9"  (1.753 m)   Eyes: Anicteric no pallor. ENMT: No discharge from the ears eyes nose and mouth. Neck: No mass felt.  No neck  rigidity. Respiratory: No rhonchi or crepitations. Cardiovascular: S1-S2 heard. Abdomen: Soft nontender bowel sounds present. Musculoskeletal: No edema. Skin: Multiple rash on neck area and extremities which are measuring around 1 cm in diameter with central eschar. Neurologic: Alert awake oriented to time place and person.  Moves all extremities. Psychiatric: Appears normal.  Normal affect.   Labs on Admission: I have personally reviewed following labs and imaging studies  CBC: Recent Labs  Lab 03/21/21 0748 03/26/21 1051  WBC 2.2* 1.7*  NEUTROABS 0.7* 0.2*  HGB 5.9* 7.6*  HCT 18.5* 23.1*  MCV 81.5 83.7  PLT 15* 14*   Basic Metabolic Panel: Recent Labs  Lab 03/21/21 0748 03/26/21 1051  NA 130* 132*  K 4.2 4.6  CL 98 97*  CO2 26 28  GLUCOSE 136* 155*  BUN 24* 23*  CREATININE 1.03 1.00  CALCIUM 9.4 9.6   GFR: Estimated Creatinine Clearance: 82.5 mL/min (by C-G formula based on SCr of 1 mg/dL). Liver Function Tests: Recent Labs  Lab 03/21/21 0748 03/26/21 1051  AST 26 21  ALT 31 32  ALKPHOS 79 73  BILITOT 1.0 1.1  PROT 7.5 7.6  ALBUMIN 3.8 3.6   No results for input(s): LIPASE, AMYLASE in the last 168 hours. No results for input(s): AMMONIA in the last 168 hours. Coagulation Profile: No results for input(s): INR, PROTIME in the last 168 hours. Cardiac Enzymes: No results for input(s): CKTOTAL, CKMB, CKMBINDEX, TROPONINI in the last 168 hours. BNP (last 3 results) No results for input(s): PROBNP in the last 8760 hours. HbA1C: No results for input(s): HGBA1C in the last 72 hours. CBG: No results for input(s): GLUCAP in the last 168 hours. Lipid Profile: No results for input(s): CHOL, HDL, LDLCALC, TRIG, CHOLHDL, LDLDIRECT in the last 72 hours. Thyroid Function Tests: No results for input(s): TSH, T4TOTAL, FREET4, T3FREE, THYROIDAB in the last 72 hours. Anemia Panel: Recent Labs    03/26/21 1051  RETICCTPCT 1.8   Urine analysis: No results found  for: COLORURINE, APPEARANCEUR, LABSPEC, PHURINE, GLUCOSEU, HGBUR, BILIRUBINUR, KETONESUR, PROTEINUR, UROBILINOGEN, NITRITE, LEUKOCYTESUR Sepsis Labs: @LABRCNTIP (procalcitonin:4,lacticidven:4) ) Recent Results (from the past 240 hour(s))  Resp Panel by RT-PCR (Flu A&B, Covid) Peripheral     Status: None   Collection Time: 03/26/21  3:20 PM   Specimen: Peripheral; Nasopharyngeal(NP) swabs in vial transport medium  Result Value Ref Range Status   SARS Coronavirus 2 by RT PCR NEGATIVE NEGATIVE Final    Comment: (NOTE) SARS-CoV-2 target nucleic acids are NOT DETECTED.  The SARS-CoV-2 RNA is generally detectable in upper respiratory specimens during the acute phase of infection. The lowest concentration of SARS-CoV-2 viral copies this assay can detect is 138 copies/mL. A negative result does not preclude SARS-Cov-2 infection and should not be used as the sole basis for treatment or other patient management decisions. A negative result may  occur with  improper specimen collection/handling, submission of specimen other than nasopharyngeal swab, presence of viral mutation(s) within the areas targeted by this assay, and inadequate number of viral copies(<138 copies/mL). A negative result must be combined with clinical observations, patient history, and epidemiological information. The expected result is Negative.  Fact Sheet for Patients:  EntrepreneurPulse.com.au  Fact Sheet for Healthcare Providers:  IncredibleEmployment.be  This test is no t yet approved or cleared by the [place] FDA and  has been authorized for detection and/or diagnosis of SARS-CoV-2 by FDA under an Emergency Use Authorization (EUA). This EUA will remain  in effect (meaning this test can be used) for the duration of the COVID-19 declaration under Section 564(b)(1) of the Act, 21 U.S.C.section 360bbb-3(b)(1), unless the authorization is terminated  or revoked sooner.        Influenza A by PCR NEGATIVE NEGATIVE Final   Influenza B by PCR NEGATIVE NEGATIVE Final    Comment: (NOTE) The Xpert Xpress SARS-CoV-2/FLU/RSV plus assay is intended as an aid in the diagnosis of influenza from Nasopharyngeal swab specimens and should not be used as a sole basis for treatment. Nasal washings and aspirates are unacceptable for Xpert Xpress SARS-CoV-2/FLU/RSV testing.  Fact Sheet for Patients: EntrepreneurPulse.com.au  Fact Sheet for Healthcare Providers: IncredibleEmployment.be  This test is not yet approved or cleared by the [place] FDA and has been authorized for detection and/or diagnosis of SARS-CoV-2 by FDA under an Emergency Use Authorization (EUA). This EUA will remain in effect (meaning this test can be used) for the duration of the COVID-19 declaration under Section 564(b)(1) of the Act, 21 U.S.C. section 360bbb-3(b)(1), unless the authorization is terminated or revoked.  Performed at [facility], [address] [place], [place] [address]      Radiological Exams on Admission: DG Chest 2 View  Result Date: 03/26/2021 CLINICAL DATA:  Shortness of breath. EXAM: CHEST - 2 VIEW COMPARISON:  March 21, 2021. FINDINGS: The heart size and mediastinal contours are within normal limits. Mild bibasilar subsegmental atelectasis is noted. No pneumothorax or pleural effusion is noted. The visualized skeletal structures are unremarkable. IMPRESSION: Mild bibasilar subsegmental atelectasis. Electronically Signed   By: Marijo Conception M.D.   On: 03/26/2021 15:53   CT Angio Chest PE W/Cm &/Or Wo Cm  Result Date: 03/26/2021 CLINICAL DATA:  PE suspected, high prob Myelodysplasia.  Chest pain, shortness of breath. EXAM: CT ANGIOGRAPHY CHEST WITH CONTRAST TECHNIQUE: Multidetector CT imaging of the chest was performed using the standard protocol during bolus administration of intravenous contrast. Multiplanar CT image  reconstructions and MIPs were obtained to evaluate the vascular anatomy. CONTRAST:  146mL OMNIPAQUE IOHEXOL 350 MG/ML SOLN COMPARISON:  Radiograph earlier today, as well as 03/21/2021 FINDINGS: Cardiovascular: There are no filling defects within the pulmonary arteries to suggest pulmonary embolus. Thoracic aorta is normal in caliber. Coronary artery calcifications or stents. Aortic valvular calcifications. Borderline cardiomegaly. Trace pericardial effusion anteriorly. Mediastinum/Nodes: Enlarged right hilar lymph nodes measuring up to 16 mm. Prominent left hilar nodes measuring up to 7 mm. Scattered prominent mediastinal nodes, largest subcarinal measuring 14 mm. No esophageal wall thickening. No thyroid nodule. Lungs/Pleura: Subpleural consolidation in the anterior left upper lobe has mild surrounding ground-glass, series 7, image 41. Smaller subpleural consolidation in the anterior right upper lobe, series 7, image 44. Patchy, ground-glass, and confluent opacities in the right greater than left lower lobe. No significant pleural effusion. Trachea and central bronchi are patent. Upper Abdomen: No acute or unexpected  findings. Musculoskeletal: There are no acute or suspicious osseous abnormalities. Review of the MIP images confirms the above findings. IMPRESSION: 1. No pulmonary embolus. 2. Patchy, ground-glass, and confluent opacities in the right greater than left lower lobe. There also subpleural opacities involving the anterior left greater than right upper lobes. Findings are suspicious for multifocal pneumonia. Recommend radiographic follow-up to resolution. 3. Prominent right greater than left hilar nodes and prominent subcarinal node, likely reactive. 4. Borderline cardiomegaly.  Coronary artery calcifications. Aortic Atherosclerosis (ICD10-I70.0). Electronically Signed   By: Keith Rake M.D.   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 Possible developing sepsis with ecthyma gangrenosum in patient with known history of myelodysplasia on chemotherapy with pancytopenia started on empiric antibiotics.  Follow blood cultures.  Closely monitor. 2. Shortness of breath with CT scan showing bilateral infiltrates concerning for multifocal pneumonia.  Patient has been started on empiric antibiotics follow cultures.  In addition patient's BNP is also elevated for which we will check 2D echo.  COVID test was negative. 3. Severe pancytopenia with history of myelodysplasia closely monitor CBC.  Given the septic picture like presentation will need close monitoring for any further worsening.  Inpatient status.   DVT prophylaxis: SCDs.  Avoiding anticoagulation with severe thrombocytopenia. Code Status: Full code. Family Communication: Wife at the bedside. Disposition Plan: Home. Consults called: None. Admission status: Inpatient.   Rise Patience MD [facility] Pager [phone].  If 7PM-7AM, please contact night-coverage [email or web address] Password [facility]  03/26/2021, 10:28 PM

## 2021-03-26 NOTE — ED Notes (Signed)
Called lab for BNP - was advised that they cannot find lavender tube  Rainbow was sent ~15:43

## 2021-03-26 NOTE — ED Notes (Signed)
Started taking a new medication - on Sunday 1st pill SOB since Sat.  Sent for allergic reaction - spots red back of neck, bilat arms   Pt wants to put his keys in his car. Advised pt that we will give to security or his son which ever comes first.

## 2021-03-26 NOTE — ED Provider Notes (Signed)
[facility] EMERGENCY DEPARTMENT Provider Note   CSN: [id number] Arrival date & time: 03/26/21  1356     History Chief Complaint  Patient presents with  . Rash  . Shortness of Breath    Thomas Frazier is a 55 y.o. male past medical history of high-grade myelodysplasia, followed by Dr. Marin Olp.  Patient current therapy includes for vidaza.  He sent to the ED today from upstairs oncology clinic for admission.  Dr. Marin Olp expresses concern for ecthyma gangrenosum in the setting of significant immunocompromise.  Patient is neutropenic her CBC today, with absolute neutrophil count of 0.2, white count of 1.7.  Patient also reports having refractory anemia, requiring transfusion last week.   Patient states rash developed over the weekend, he wonders if it is related to his new medication.  He states the rash is not necessarily painful except when he is in the shower.  Initially noted it to the back of his neck though does have a rash to his face, some of his arms and some to his legs he also reports shortness of breath that has been gradually worsening since last week.  He states he mostly notices it when he is trying to talk, he feels quite short of breath.  He denies any chest pain or cough.  No history of blood clot.  The history is provided by the patient and medical records.       Past Medical History:  Diagnosis Date  . 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 Ecthyma gangrenosum 03/26/2021  . 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 Thrombocytopenia ([place]) 01/24/2021    Past Surgical History:  Procedure Laterality Date  . APPENDECTOMY         History reviewed. No pertinent family history.  Social History   Tobacco Use  . Smoking status: Never Smoker  . Smokeless tobacco: Never Used  Vaping Use  . Vaping Use: Never used  Substance Use Topics  . Alcohol use: Not Currently  .  Drug use: Never    Home Medications Prior to Admission medications   Medication Sig Start Date End Date Taking? Authorizing Provider  azaCITIDine (VIDAZA) 100 MG SUSR Vidaza 142.5 mg (rounded from 143.25 mg)=75 mg/m2 x 1.91 m2 daily times five days every twenty eight days 02/19/21   [name], MD  ciprofloxacin (CIPRO) 500 MG tablet Take 1 tablet (500 mg total) by mouth daily with breakfast. Patient not taking: Reported on 03/24/2021 03/21/21   [name], MD  famciclovir (FAMVIR) 500 MG tablet Take 1 tablet (500 mg total) by mouth daily. Patient not taking: Reported on 03/24/2021 03/21/21   [name], MD  fluconazole (DIFLUCAN) 100 MG tablet Take 1 tablet (100 mg total) by mouth daily. Patient not taking: Reported on 03/24/2021 03/21/21   [name], MD  Multiple Vitamins-Iron (MULTIVITAMIN/IRON PO) Take 1 tablet by mouth daily. Patient not taking: Reported on 03/05/2021 01/22/21   [provider]  ondansetron (ZOFRAN) 8 MG tablet Take 1 tablet (8 mg total) by mouth 2 (two) times daily as needed (Nausea or vomiting). 02/27/21   [name], MD  prochlorperazine (COMPAZINE) 10 MG tablet Take 1 tablet (10 mg total) by mouth every 6 (six) hours as needed (Nausea or vomiting). 02/27/21   [name], MD  traMADol (ULTRAM) 50 MG tablet Take 1-2 tablets (50-100 mg total) by mouth every 6 (six) hours as  needed. 03/10/21   [name], MD    Allergies    Ciprofloxacin, Diflucan [fluconazole], and Famvir [famciclovir]  Review of Systems   Review of Systems  Constitutional: Positive for fatigue.  Respiratory: Positive for shortness of breath.   Gastrointestinal: Negative for nausea and vomiting.  Skin: Positive for rash.  Allergic/Immunologic: Positive for immunocompromised state.  Neurological: Positive for weakness.  All other systems reviewed and are negative.   Physical Exam Updated Vital Signs BP (!) 116/59 (BP Location: Right Arm)   Pulse 100    Temp (!) 100.6 F (38.1 C) (Oral)   Resp 20   Ht 5\' 9"  (1.753 m)   Wt 72.6 kg   SpO2 95%   BMI 23.64 kg/m   Physical Exam Vitals and nursing note reviewed.  Constitutional:      Appearance: He is well-developed. He is ill-appearing.  HENT:     Head: Normocephalic and atraumatic.  Eyes:     Conjunctiva/sclera: Conjunctivae normal.  Cardiovascular:     Rate and Rhythm: Regular rhythm. Tachycardia present.  Pulmonary:     Effort: No respiratory distress.     Breath sounds: Normal breath sounds.     Comments: Patient seems winded with speaking. Abdominal:     General: Bowel sounds are normal.     Palpations: Abdomen is soft.     Tenderness: There is no abdominal tenderness.  Musculoskeletal:     Comments: Trace pretibial edema bilaterally  Skin:    General: Skin is warm.     Coloration: Skin is pale.     Comments: Numerous scattered lesions, mostly localized to the back of the neck and head, though also located to the arms and distal legs.  Rash varies from the irregular border to rounded border, very dark purplish in color.  Not blanching. Raised.  Somewhat tender.  There is a lesion present to the left palm.  Neurological:     Mental Status: He is alert.  Psychiatric:        Behavior: Behavior normal.    Left forearm      Lateral neck   ED Results / Procedures / Treatments   Labs (all labs ordered are listed, but only abnormal results are displayed) Labs Reviewed  BRAIN NATRIURETIC PEPTIDE - Abnormal; Notable for the following components:      Result Value   B Natriuretic Peptide 1,238.3 (*)    All other components within normal limits  RESP PANEL BY RT-PCR (FLU A&B, COVID) [id number]  CULTURE, BLOOD (ROUTINE X 2)  CULTURE, BLOOD (ROUTINE X 2)  LACTIC ACID, PLASMA    EKG None  Radiology DG Chest 2 View  Result Date: 03/26/2021 CLINICAL DATA:  Shortness of breath. EXAM: CHEST - 2 VIEW COMPARISON:  March 21, 2021. FINDINGS: The heart size and mediastinal  contours are within normal limits. Mild bibasilar subsegmental atelectasis is noted. No pneumothorax or pleural effusion is noted. The visualized skeletal structures are unremarkable. IMPRESSION: Mild bibasilar subsegmental atelectasis. Electronically Signed   By: Marijo Conception M.D.   On: 03/26/2021 15:53   CT Angio Chest PE W/Cm &/Or Wo Cm  Result Date: 03/26/2021 CLINICAL DATA:  PE suspected, high prob Myelodysplasia.  Chest pain, shortness of breath. EXAM: CT ANGIOGRAPHY CHEST WITH CONTRAST TECHNIQUE: Multidetector CT imaging of the chest was performed using the standard protocol during bolus administration of intravenous contrast. Multiplanar CT image reconstructions and MIPs were obtained to evaluate the vascular anatomy. CONTRAST:  141mL OMNIPAQUE IOHEXOL 350 MG/ML SOLN  COMPARISON:  Radiograph earlier today, as well as 03/21/2021 FINDINGS: Cardiovascular: There are no filling defects within the pulmonary arteries to suggest pulmonary embolus. Thoracic aorta is normal in caliber. Coronary artery calcifications or stents. Aortic valvular calcifications. Borderline cardiomegaly. Trace pericardial effusion anteriorly. Mediastinum/Nodes: Enlarged right hilar lymph nodes measuring up to 16 mm. Prominent left hilar nodes measuring up to 7 mm. Scattered prominent mediastinal nodes, largest subcarinal measuring 14 mm. No esophageal wall thickening. No thyroid nodule. Lungs/Pleura: Subpleural consolidation in the anterior left upper lobe has mild surrounding ground-glass, series 7, image 41. Smaller subpleural consolidation in the anterior right upper lobe, series 7, image 44. Patchy, ground-glass, and confluent opacities in the right greater than left lower lobe. No significant pleural effusion. Trachea and central bronchi are patent. Upper Abdomen: No acute or unexpected findings. Musculoskeletal: There are no acute or suspicious osseous abnormalities. Review of the MIP images confirms the above findings.  IMPRESSION: 1. No pulmonary embolus. 2. Patchy, ground-glass, and confluent opacities in the right greater than left lower lobe. There also subpleural opacities involving the anterior left greater than right upper lobes. Findings are suspicious for multifocal pneumonia. Recommend radiographic follow-up to resolution. 3. Prominent right greater than left hilar nodes and prominent subcarinal node, likely reactive. 4. Borderline cardiomegaly.  Coronary artery calcifications. Aortic Atherosclerosis (ICD10-I70.0). Electronically Signed   By: Keith Rake M.D.   On: 03/26/2021 19:11    Procedures Procedures   Medications Ordered in ED Medications  ceFEPIme (MAXIPIME) 2 g in sodium chloride 0.9 % 100 mL IVPB (0 g Intravenous Stopped 03/26/21 1655)  iohexol (OMNIPAQUE) 350 MG/ML injection 100 mL (100 mLs Intravenous Contrast Given 03/26/21 1822)    ED Course  I have reviewed the triage vital signs and the nursing notes.  Pertinent labs & imaging results that were available during my care of the patient were reviewed by me and considered in my medical decision making (see chart for details).  Clinical Course as of 03/26/21 2014  Wed Mar 26, 2021  1550 Infectious disease pharmacist recommends cefepime is appropriate, do not need to double cover at this time for Pseudomonas. [JR]    Clinical Course User Index [JR] [place] Want, [place] N, PA-C   MDM Rules/Calculators/A&P                          Patient with history of mild dysplasia, sent to the ED by Dr. Marin Olp, oncologist, for medical admission.  Concern for neutropenia with rash that is consistent/concerning for ecthyma gangrenosum.  He is severely neutropenic, white blood cell count 1.7, absolute neutrophil 0.2, platelet count 14. discussed with ID pharmacist, cefepime ordered for coverage.  Additionally, patient is short of breath, has been progressively worsening over the week.  He does not have a lactic acidosis here, nor does he have pleural  effusion or any new findings on chest x-ray. No peripheral edema or CXR findings to suggest CHF  There is concern for PE, CTA is ordered.  Hospitalist consulted for admission.  Final Clinical Impression(s) / ED Diagnoses Final diagnoses:  Ecthyma gangrenosum  Immunocompromised [facility])    Rx / DC Orders ED Discharge Orders    None       Saralyn Willison, [place] N, PA-C 03/26/21 2014    Sherwood Gambler, MD 03/30/21 1530

## 2021-03-26 NOTE — ED Notes (Addendum)
Spent greater than 15 minutes explaining the ED to hospital process. Pt upset wanting to go to the hospital "like my doctor told me"  Expressed frustration about everyone asking the same questions - "My doctor knows me, why does everyone ask again?" this was explained also. Blood work acquired and sent to lab - it was explained that more blood work was ordered. Pt stated that he already had blood work completed by his MD.  Pt wanted to leave the ED and put his car keys in his car for his son to pick up later. Pt was advised that we have security and keys could be left with them, if necessary.  Pt put on monitor, has call bell - advised to use call bell before getting out of bed. Our doctor here would request a bed/transfer and pt would be updated when the information becomes available

## 2021-03-26 NOTE — Progress Notes (Signed)
PMH: High-grade myelodysplasia, anemia of chronic disease, thrombocytopenia  Sent to ED from oncology office by Dr. 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  Slightly tachycardic to 107.  Mild temp to 99.  Otherwise vitals within normal.  Saturating in upper 90s on RA. Significant labs-Na 132 (stable).  Glucose 155.  WBC 1.7.  Neutrophil count 0.2.  Hgb 7.6. Platelet 14.  Lactic acid negative.  COVID-19 and influenza PCR nonreactive.  CXR with bibasilar atelectasis.  Blood cultures drawn.  CTA chest ordered to exclude PE.  Started on IV cefepime per discussion between ED PA and pharmacy.  Hospitalist service called for admission.   Admitting to telemetry.

## 2021-03-27 ENCOUNTER — Inpatient Hospital Stay (HOSPITAL_COMMUNITY): Payer: Self-pay

## 2021-03-27 ENCOUNTER — Telehealth: Payer: Self-pay

## 2021-03-27 ENCOUNTER — Encounter (HOSPITAL_COMMUNITY): Payer: Self-pay | Admitting: Internal Medicine

## 2021-03-27 DIAGNOSIS — I350 Nonrheumatic aortic (valve) stenosis: Secondary | ICD-10-CM

## 2021-03-27 DIAGNOSIS — J189 Pneumonia, unspecified organism: Secondary | ICD-10-CM

## 2021-03-27 DIAGNOSIS — D61818 Other pancytopenia: Principal | ICD-10-CM

## 2021-03-27 DIAGNOSIS — I248 Other forms of acute ischemic heart disease: Secondary | ICD-10-CM

## 2021-03-27 DIAGNOSIS — D46Z Other myelodysplastic syndromes: Secondary | ICD-10-CM

## 2021-03-27 DIAGNOSIS — I700 Atherosclerosis of aorta: Secondary | ICD-10-CM

## 2021-03-27 DIAGNOSIS — R7989 Other specified abnormal findings of blood chemistry: Secondary | ICD-10-CM

## 2021-03-27 DIAGNOSIS — I351 Nonrheumatic aortic (valve) insufficiency: Secondary | ICD-10-CM

## 2021-03-27 DIAGNOSIS — E440 Moderate protein-calorie malnutrition: Secondary | ICD-10-CM | POA: Insufficient documentation

## 2021-03-27 DIAGNOSIS — R0609 Other forms of dyspnea: Secondary | ICD-10-CM

## 2021-03-27 HISTORY — DX: Atherosclerosis of aorta: I70.0

## 2021-03-27 LAB — CBC WITH DIFFERENTIAL/PLATELET
Abs Immature Granulocytes: 0.02 10*3/uL (ref 0.00–0.07)
Basophils Absolute: 0 10*3/uL (ref 0.0–0.1)
Basophils Relative: 1 %
Eosinophils Absolute: 0 10*3/uL (ref 0.0–0.5)
Eosinophils Relative: 0 %
HCT: 21.3 % — ABNORMAL LOW (ref 39.0–52.0)
Hemoglobin: 6.7 g/dL — CL (ref 13.0–17.0)
Immature Granulocytes: 1 %
Lymphocytes Relative: 78 %
Lymphs Abs: 1.3 10*3/uL (ref 0.7–4.0)
MCH: 27.3 pg (ref 26.0–34.0)
MCHC: 31.5 g/dL (ref 30.0–36.0)
MCV: 86.9 fL (ref 80.0–100.0)
Monocytes Absolute: 0.1 10*3/uL (ref 0.1–1.0)
Monocytes Relative: 6 %
Neutro Abs: 0.2 10*3/uL — CL (ref 1.7–7.7)
Neutrophils Relative %: 14 %
Platelets: 12 10*3/uL — CL (ref 150–400)
RBC: 2.45 MIL/uL — ABNORMAL LOW (ref 4.22–5.81)
RDW: 28.2 % — ABNORMAL HIGH (ref 11.5–15.5)
WBC: 1.7 10*3/uL — ABNORMAL LOW (ref 4.0–10.5)
nRBC: 44.2 % — ABNORMAL HIGH (ref 0.0–0.2)

## 2021-03-27 LAB — BASIC METABOLIC PANEL
Anion gap: 8 (ref 5–15)
BUN: 17 mg/dL (ref 6–20)
CO2: 23 mmol/L (ref 22–32)
Calcium: 8.7 mg/dL — ABNORMAL LOW (ref 8.9–10.3)
Chloride: 103 mmol/L (ref 98–111)
Creatinine, Ser: 0.88 mg/dL (ref 0.61–1.24)
GFR, Estimated: >60 mL/min (ref >60)
Glucose, Bld: 172 mg/dL — ABNORMAL HIGH (ref 70–99)
Potassium: 4.1 mmol/L (ref 3.5–5.1)
Sodium: 134 mmol/L — ABNORMAL LOW (ref 135–145)

## 2021-03-27 LAB — TROPONIN I (HIGH SENSITIVITY)
Troponin I (High Sensitivity): 37 ng/L — ABNORMAL HIGH (ref <18)
Troponin I (High Sensitivity): 42 ng/L — ABNORMAL HIGH (ref <18)

## 2021-03-27 LAB — HEPATIC FUNCTION PANEL
ALT: 32 U/L (ref 0–44)
AST: 22 U/L (ref 15–41)
Albumin: 2.7 g/dL — ABNORMAL LOW (ref 3.5–5.0)
Alkaline Phosphatase: 65 U/L (ref 38–126)
Bilirubin, Direct: 0.2 mg/dL (ref 0.0–0.2)
Indirect Bilirubin: 0.8 mg/dL (ref 0.3–0.9)
Total Bilirubin: 1 mg/dL (ref 0.3–1.2)
Total Protein: 6.8 g/dL (ref 6.5–8.1)

## 2021-03-27 LAB — ECHOCARDIOGRAM COMPLETE
AR max vel: 1.1 cm2
AV Area VTI: 1.28 cm2
AV Area mean vel: 1.17 cm2
AV Mean grad: 23.3 mmHg
AV Peak grad: 39.1 mmHg
Ao pk vel: 3.13 m/s
Area-P 1/2: 4.49 cm2
Calc EF: 49.5 %
Height: 69 in
MV M vel: 3.54 m/s
MV Peak grad: 50 mmHg
P 1/2 time: 215 msec
S' Lateral: 4.3 cm
Single Plane A2C EF: 59.4 %
Single Plane A4C EF: 39.4 %
Weight: 2560.86 oz

## 2021-03-27 LAB — PREPARE RBC (CROSSMATCH)

## 2021-03-27 LAB — PATHOLOGIST SMEAR REVIEW

## 2021-03-27 LAB — HIV ANTIBODY (ROUTINE TESTING W REFLEX): HIV Screen 4th Generation wRfx: NONREACTIVE

## 2021-03-27 MED ORDER — SODIUM CHLORIDE 0.9% IV SOLUTION: Freq: Once | INTRAVENOUS | Status: AC

## 2021-03-27 MED ORDER — ADULT MULTIVITAMIN W/MINERALS CH
1.0000 | ORAL_TABLET | Freq: Every day | ORAL | Status: DC
  Administered 2021-03-27 – 2021-03-31 (×5): 1 via ORAL
  Filled 2021-03-27 (×5): qty 1

## 2021-03-27 MED ORDER — ENSURE ENLIVE PO LIQD
237.0000 mL | Freq: Two times a day (BID) | ORAL | Status: DC
  Administered 2021-03-27 – 2021-03-28 (×2): 237 mL via ORAL

## 2021-03-27 NOTE — Consult Note (Addendum)
[place]  Telephone:([phone] Fax:(336) [place]   Reason for Consultation: MDS, anemia, thrombocytopenia  HPI: Thomas Frazier is a 55 year old male with a past medical history significant for refractory anemia with multilineage dysplasia, high-grade with multiple complex cytogenetics-IPSS equals 6.5.  The patient is currently receiving treatment with Vidaza 75 mg meter squared given subcu daily days 1 through 5 of each cycle.  He appears to have completed 2 cycles at this point.  The patient was seen in the office yesterday and was not doing well.  He had developed a rash particularly on his face, upper back, arms, and some lesions on his legs as well.  He was also reporting shortness of breath and cough.  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  CTA chest was performed on admission which was concerning for multifocal pneumonia.  He has been started on IV antibiotics.  Cultures obtained and results are negative to date.  He had a fever up to 100.6 last evening.  He also reported having some chills.  This morning, he reports that he has not had a significant appetite but is try to take in some boost.  Is not having any fevers or chills this morning.  He does report that he is feeling somewhat better overall since prior to admission.  He still has an intermittent cough.  No chest pain reported.  He does report some mild epistaxis morning.  Denies abdominal pain, nausea, vomiting.  Hematology was asked see the patient make recommendations regarding his MDS, anemia, thrombocytopenia.  Past Medical History:  Diagnosis Date  . 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 APPENDECTOMY    :   CURRENT MEDS: Current Facility-Administered Medications  Medication  Dose Route Frequency Provider Last Rate Last Admin  . 0.9 %  sodium chloride infusion (Manually program via Guardrails IV Fluids)   Intravenous Once Rise Patience, MD      . acetaminophen (TYLENOL) tablet 650 mg  650 mg Oral Q6H PRN Rise Patience, MD       Or  . acetaminophen (TYLENOL) suppository 650 mg  650 mg Rectal Q6H PRN Rise Patience, MD      . ceFEPIme (MAXIPIME) 2 g in sodium chloride 0.9 % 100 mL IVPB  2 g Intravenous Q8H Rise Patience, MD 200 mL/hr at 03/27/21 0031 2 g at 03/27/21 0031  . traMADol (ULTRAM) tablet 50-100 mg  50-100 mg Oral Q6H PRN Rise Patience, MD      . vancomycin (VANCOREADY) IVPB 1000 mg/200 mL  1,000 mg Intravenous Q12H Poindexter, Leann T, RPH          Allergies  Allergen Reactions  . Ciprofloxacin Rash  . Diflucan [Fluconazole] Rash  . Famvir [Famciclovir] Rash  :  Family History  Family history unknown: Yes  :  Social History   Socioeconomic History  . Marital status: Married    Spouse name: Not on file  . Number of children: Not on file  . Years of education: Not on file  . Highest education level: Not on file  Occupational History  . Not on file  Tobacco Use  . Smoking status: Never Smoker  . Smokeless tobacco: Never Used  Vaping Use  . Vaping Use: Never used  Substance and Sexual Activity  . Alcohol use: Not Currently  . Drug use: Never  . Sexual activity: Not on file  Other Topics Concern  . Not on file  Social History Narrative  . Not on file   Social Determinants of Health   Financial Resource Strain: Not on file  Food Insecurity: Not on file  Transportation Needs: Not on file  Physical Activity: Not on file  Stress: Not on file  Social Connections: Not on file  Intimate Partner Violence: Not on file  :  REVIEW OF SYSTEMS:  A comprehensive 14 point review of systems was negative except as noted in the HPI.    Exam: Patient Vitals for the past 24 hrs:  BP Temp Temp src Pulse Resp  SpO2 Height Weight  03/27/21 0814 -- -- -- 92 -- 100 % -- --  03/27/21 0810 117/61 99.6 F (37.6 C) Oral 97 (!) 24 -- -- --  03/27/21 0359 116/64 98.9 F (37.2 C) Oral (!) 101 20 95 % -- --  03/26/21 2345 (!) 112/59 (!) 100.4 F (38 C) Oral (!) 101 20 96 % -- --  03/26/21 1950 (!) 116/59 (!) 100.6 F (38.1 C) Oral 100 20 95 % 5\' 9"  (1.753 m) 72.6 kg  03/26/21 1845 131/68 -- -- 92 18 96 % -- --  03/26/21 1815 125/67 -- -- (!) 110 -- 96 % -- --  03/26/21 1800 116/60 -- -- (!) 110 16 96 % -- --  03/26/21 1745 127/67 -- -- (!) 110 -- 96 % -- --  03/26/21 1730 124/62 -- -- (!) 109 18 97 % -- --  03/26/21 1715 121/64 -- -- (!) 110 -- 97 % -- --  03/26/21 1700 117/64 -- -- (!) 107 -- 96 % -- --  03/26/21 1645 121/62 -- -- (!) 107 -- 95 % -- --  03/26/21 1630 117/67 -- -- (!) 105 20 97 % -- --  03/26/21 1615 114/64 -- -- (!) 107 -- 95 % -- --  03/26/21 1600 120/66 -- -- (!) 105 -- 97 % -- --  03/26/21 1545 121/65 -- -- (!) 104 -- 100 % -- --  03/26/21 1530 (!) 117/59 -- -- (!) 103 -- 97 % -- --  03/26/21 1515 111/65 -- -- (!) 102 20 95 % -- --  03/26/21 1500 116/62 -- -- (!) 106 -- 97 % -- --  03/26/21 1457 -- -- -- -- -- -- 5\' 9"  (1.753 m) 72.5 kg  03/26/21 1404 131/61 99 F (37.2 C) Oral (!) 107 20 99 % -- --    General:  well-nourished in no acute distress.   Eyes:  no scleral icterus.   ENT:  There were no oropharyngeal lesions.     Lymphatics:  Negative cervical, supraclavicular or axillary adenopathy.   Respiratory: lungs were clear bilaterally without wheezing or crackles.   Cardiovascular:  Regular rate and rhythm, S1/S2, without murmur, rub or gallop.  There was no pedal edema.   GI:  abdomen was soft, flat, nontender, nondistended, without organomegaly.   Musculoskeletal:  no spinal tenderness of palpation of vertebral spine.   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  Patient was alert  and oriented.  Attention was good.   Language was appropriate.  Mood was normal without depression.  Speech was not pressured.  Thought content was not tangential.    LABS:  Lab Results  Component Value  Date   WBC 1.7 (L) 03/27/2021   HGB 6.7 (LL) 03/27/2021   HCT 21.3 (L) 03/27/2021   PLT 12 (LL) 03/27/2021   GLUCOSE 172 (H) 03/27/2021   ALT 32 03/27/2021   AST 22 03/27/2021   NA 134 (L) 03/27/2021   K 4.1 03/27/2021   CL 103 03/27/2021   CREATININE 0.88 03/27/2021   BUN 17 03/27/2021   CO2 23 03/27/2021    DG Chest 2 View  Result Date: 03/26/2021 CLINICAL DATA:  Shortness of breath. EXAM: CHEST - 2 VIEW COMPARISON:  March 21, 2021. FINDINGS: The heart size and mediastinal contours are within normal limits. Mild bibasilar subsegmental atelectasis is noted. No pneumothorax or pleural effusion is noted. The visualized skeletal structures are unremarkable. IMPRESSION: Mild bibasilar subsegmental atelectasis. Electronically Signed   By: Marijo Conception M.D.   On: 03/26/2021 15:53   DG Chest 2 View  Result Date: 03/21/2021 CLINICAL DATA:  Shortness of breath, fever, cough. EXAM: CHEST - 2 VIEW COMPARISON:  None. FINDINGS: The heart size and mediastinal contours are within normal limits. No pneumothorax is noted. Minimal bibasilar subsegmental atelectasis is noted with small pleural effusions. The visualized skeletal structures are unremarkable. IMPRESSION: Minimal bibasilar subsegmental atelectasis with small pleural effusions. Electronically Signed   By: Marijo Conception M.D.   On: 03/21/2021 09:31   CT Angio Chest PE W/Cm &/Or Wo Cm  Result Date: 03/26/2021 CLINICAL DATA:  PE suspected, high prob Myelodysplasia.  Chest pain, shortness of breath. EXAM: CT ANGIOGRAPHY CHEST WITH CONTRAST TECHNIQUE: Multidetector CT imaging of the chest was performed using the standard protocol during bolus administration of intravenous contrast. Multiplanar CT image reconstructions and MIPs were obtained to  evaluate the vascular anatomy. CONTRAST:  116mL OMNIPAQUE IOHEXOL 350 MG/ML SOLN COMPARISON:  Radiograph earlier today, as well as 03/21/2021 FINDINGS: Cardiovascular: There are no filling defects within the pulmonary arteries to suggest pulmonary embolus. Thoracic aorta is normal in caliber. Coronary artery calcifications or stents. Aortic valvular calcifications. Borderline cardiomegaly. Trace pericardial effusion anteriorly. Mediastinum/Nodes: Enlarged right hilar lymph nodes measuring up to 16 mm. Prominent left hilar nodes measuring up to 7 mm. Scattered prominent mediastinal nodes, largest subcarinal measuring 14 mm. No esophageal wall thickening. No thyroid nodule. Lungs/Pleura: Subpleural consolidation in the anterior left upper lobe has mild surrounding ground-glass, series 7, image 41. Smaller subpleural consolidation in the anterior right upper lobe, series 7, image 44. Patchy, ground-glass, and confluent opacities in the right greater than left lower lobe. No significant pleural effusion. Trachea and central bronchi are patent. Upper Abdomen: No acute or unexpected findings. Musculoskeletal: There are no acute or suspicious osseous abnormalities. Review of the MIP images confirms the above findings. IMPRESSION: 1. No pulmonary embolus. 2. Patchy, ground-glass, and confluent opacities in the right greater than left lower lobe. There also subpleural opacities involving the anterior left greater than right upper lobes. Findings are suspicious for multifocal pneumonia. Recommend radiographic follow-up to resolution. 3. Prominent right greater than left hilar nodes and prominent subcarinal node, likely reactive. 4. Borderline cardiomegaly.  Coronary artery calcifications. Aortic Atherosclerosis (ICD10-I70.0). Electronically Signed   By: Keith Rake M.D.   On: 03/26/2021 19:11     ASSESSMENT AND PLAN:  1.  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   Multifocal pneumonia 3.  Pancytopenia secondary underlying MDS, acute infection, and recent chemotherapy 4.  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  Follow cultures. -Hemoglobin 6.7 this  morning.  Agree with transfusing 1 unit PRBCs.  Recommend PRBC transfusion to keep hemoglobin above 8. -Platelet count 12,000 this morning.  He has some epistaxis.  Will give 1 unit of platelets. -Monitor CBC with differential daily.  Mikey Bussing, DNP, AGPCNP-BC, AOCNP  ADDENDUM:    I saw and examined Thomas Frazier this morning.  His labs are not back yet.  He still has these lesions on his skin.  His blood cultures were negative.  He is on broad-spectrum antibiotics.  I just think that 1 of these lesions should be biopsied.  It might be a vasculitis (i.e. Sweet syndrome).  Again, I do worry about the possibility of ecthyma gangrenosum.  He has had no fever.  There has been no obvious bleeding.  His breathing seems to be doing a little bit better.  He did have a CT angiogram done.  This was negative for any pulmonary embolism.  He had patchy, groundglass and confluent opacities in both lungs.  This may been suspicious for multifocal pneumonia.  Again, I think it would be reasonable to biopsy one of the skin lesions.  I will see if surgery can come by to do a biopsy.  This could certainly help [place] out.  We will have to see what his labs look like.  I appreciate the outstanding care that he is getting from all the staff on [address].  I know that they will do a fantastic job with him.   Lattie Haw, MD  Darlyn Chamber 17:14

## 2021-03-27 NOTE — Discharge Instructions (Signed)
[facility] Stay Proper nutrition can help your body recover from illness and injury.   Foods and beverages high in protein, vitamins, and minerals help rebuild muscle loss, promote healing, & reduce fall risk.   .In addition to eating healthy foods, a nutrition shake is an easy, delicious way to get the nutrition you need during and after your hospital stay  It is recommended that you continue to drink 2-3 bottles per day of: Ensure Plusfor at least 1 month (30 days) after your hospital stay   Tips for adding a nutrition shake into your routine: As allowed, drink one with vitamins or medications instead of water or juice Enjoy one as a tasty mid-morning or afternoon snack Drink cold or make a milkshake out of it Drink one instead of milk with cereal or snacks Use as a coffee creamer   Available at the following grocery stores and pharmacies:           * [facility] [phone]            For COUPONS visit: www.ensure.com/join or [email or web address]-may.[email or web address]/   Suggested Substitutions Ensure Plus = Boost Plus = Carnation Breakfast Essentials = Boost Compact Ensure Active Clear = Boost Breeze Glucerna Shake = Boost Glucose Control = Carnation Breakfast Essentials SUGAR FREE  Suggestions For Increasing Calories And Protein . Several small meals a day are easier to eat and digest than three large ones. Space meals about 2 to 3 hours apart to maximize comfort. . Stop eating 2 to 3 hours before bed and sleep with your head elevated if gastric reflux (GERD) and heartburn are problems. . Do not eat your favorite foods if you are feeling bad. Save them for when you feel good! . Eat breakfast-type foods at any meal. Eggs are usually easy to eat and are great any time of the day. (The same goes for pancakes and  waffles.) . Eat when you feel hungry. Most people have the greatest appetite in the morning because they have not eaten all night. If this is the best meal for you, then pile on those calories and other nutrients in the morning and at lunch. Then you can have a smaller dinner without losing total calories for the day. . Eat leftovers or nutritious snacks in the afternoon and early evening to round out your day. . Try homemade or commercially prepared nutrition bars and puddings, as well as calorie- and protein-rich liquid nutritional supplements. Benefits of Physical Activity Talk to your doctor about physical activity. Light or moderate physical activity can help maintain muscle and promote an appetite. Walking in the neighborhood or the local mall is a great way to get up, get out, and get moving. If you are unsteady on your feet, try walking around the dining room table. Save Room for [employer]! Drink most fluids between meals instead of with meals. (It is fine to have a sip to help swallow food at meal time.) Fluids (which usually have fewer calories and nutrients than solid food) can take up valuable space in your stomach.  Foods Recommended High-Protein Foods Milk products Add cheese to toast, crackers, sandwiches, baked potatoes, vegetables, soups, noodles, meat, and fruit. Use reduced-fat (2%)  or whole milk in place of water when cooking cereal and cream soups. Include cream sauces on vegetables and pasta. Add powdered milk to cream soups and mashed potatoes.  Eggs Have hard-cooked eggs readily available in the refrigerator. Chop and add to salads, casseroles, soups, and vegetables. Make a quick egg salad. All eggs should be well cooked to avoid the risk of harmful bacteria.  Meats, poultry, and fish Add leftover cooked meats to soups, casseroles, salads, and omelets. Make dip by mixing diced, chopped, or shredded meat with sour cream and spices.  Beans, legumes, nuts, and seeds  Sprinkle nuts and seeds on cereals, fruit, and desserts such as ice cream, pudding, and custard. Also serve nuts and seeds on vegetables, salads, and pasta. Spread peanut butter on toast, bread, English muffins, and fruit, or blend it in a milk shake. Add beans and peas to salads, soups, casseroles, and vegetable dishes.  High-Calorie Foods Butter, margarine, and  oils Melt butter or margarine over potatoes, rice, pasta, and cooked vegetables. Add melted butter or margarine into soups and casseroles and spread on bread for sandwiches before spreading sandwich spread or peanut butter. Saut or stir-fry vegetables, meats, chicken and fish such as shrimp/scallops in olive or canola oil. A variety of oils add calories and can be used to [employer], chicken, or fish.  Milk products Add whipping cream to desserts, pancakes, waffles, fruit, and hot chocolate, and fold it into soups and casseroles. Add sour cream to baked potatoes and vegetables.  Salad dressing Use regular (not low-fat or diet) mayonnaise and salad dressing on sandwiches and in dips with vegetables and fruit.   Sweets Add jelly and honey to bread and crackers. Add jam to fruit and ice cream and as a topping over cake.   Copyright 2020  Academy of Nutrition and Dietetics. All rights reserved.

## 2021-03-27 NOTE — Progress Notes (Signed)
*  PRELIMINARY RESULTS* Echocardiogram 2D Echocardiogram has been performed.  Luisa Hart, [place] 03/27/2021, 3:20 PM

## 2021-03-27 NOTE — Progress Notes (Signed)
PROGRESS NOTE  Phinehas Grounds ZHY:[id number] DOB: [birth year] DOA: 03/26/2021 PCP: Cathleen Corti, PA-C  Brief History    55 year old man PMH high-grade myelodysplasia on chemotherapy, pancytopenia, noticed rash on body approximately 48 hours prior, sent by oncologist to the emergency department for concern of eczema gangrenosum.  Admitted for further evaluation of suspected ecthyma gangrenosum, pancytopenia, rule out sepsis, possible multifocal pneumonia. A & P  Multifocal pneumonia, concern for eczema gangrenosum, rule out sepsis, complicated by pancytopenia, immunosuppression secondary to chemotherapy.  CTA chest no PE.  Patchy groundglass confluent opacities right greater than left lobe. -- Hemodynamics are stable, no hypoxia but he is tachypneic and dyspneic.  Continue empiric antibiotics.  Ecthyma gangrenosum?  Rash as an outpatient 4/25, instructed by Dr. Marin Olp to stop Famvir, Diflucan and Cipro. 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  Continue antibiotics.  Follow-up blood cultures.  I do not believe he has any hardware.  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 Marin Olp.  Being treated with Vidaza, status post cycle 1 started 02/17/2021. --Seen by Dr. Marin Olp 4/22, given 2 units of blood and 1 unit platelets at that time and started on antibiotics given leukopenia. --Hemoglobin trending down, 6.7.  WBC stable and platelets stable at 12.  Plan for transfusion as per oncology.  Elevated BNP, troponin elevated consistent with demand ischemia, CT showed borderline cardiomegaly and coronary artery calcifications as well as aortic atherosclerosis -- Asymptomatic.  No signs or symptoms of ACS.  No evidence of volume overload. Check EKG, echocardiogram, continue telemetry  Aortic atherosclerosis -- No treatment indicated at this time.  Disposition Plan:  Discussion: Discussed  in detail with patient and son at bedside.  Wife also at bedside.  Status is: Inpatient  Remains inpatient appropriate because:Ongoing diagnostic testing needed not appropriate for outpatient work up, IV treatments appropriate due to intensity of illness or inability to take PO and Inpatient level of care appropriate due to severity of illness   Dispo: The patient is from: Home              Anticipated d/c is to: Home              Patient currently is not medically stable to d/c.   Difficult to place patient No  DVT prophylaxis: SCDs Start: 03/26/21 2227   Code Status: Full Code Level of care: Telemetry Family Communication: as above  Murray Hodgkins, MD  [facility] Direct contact: see [email or web address] (further directions at bottom of note if needed) 7PM-7AM contact night coverage as at bottom of note 03/27/2021, 10:13 AM  LOS: 1 day   Significant Hospital Events   . 4/27 admit for pneumonia, pancytopenia, rash   Consults:  . Oncology    Procedures:  .   Significant Diagnostic Tests:  . CT chest multifocal pneumonia   Micro Data:  . 4/27 BC >   Antimicrobials:  . Cefepime 4/27 >  . Vancomycin 4/27 >  Interval History/Subjective  CC: f/u rash, SOB  Short of breath, continues to have rash which is painful with shower or touch, no nausea or vomiting.  Speaks excellent English, reports understanding everything thus far overnight.  Offered Spanish interpreter but he was satisfied to converse in [place].  Objective   Vitals:  Vitals:   03/27/21 0814 03/27/21 1003  BP:  125/62  Pulse: 92 100  Resp:  (!) 22  Temp:  (!) 100.4 F (38 C)  SpO2: 100%  98%    Exam:  Constitutional:   . Appears calm and comfortable Eyes:  . pupils and irises appear normal ENMT:  . grossly normal hearing  . Lips appear normal . Tongue and lips appear normal Respiratory:  . CTA bilaterally, no w/r/r.  . Respiratory effort mildly increased, able to speak in short sentences,  tachypneic, mild dyspnea. Cardiovascular:  . RRR, no m/r/g . No LE extremity edema   Abdomen:  . Soft, nontender Musculoskeletal:  . Able to sit up in bed without assistance Skin:  . Scattered rash noted, right leg, left arm, face, neck, see pictures.  No ulcer seen. Psychiatric:  . Mental status o Mood, affect appropriate            I have personally reviewed the following:   Today's Data  . Elevated BNP noted, mildly elevated troponins noted, trending down . Hemoglobin 6.7 down.  Platelets stable at 12.  WBC stable 1.7.  Scheduled Meds:  Continuous Infusions: . ceFEPime (MAXIPIME) IV 2 g (03/27/21 0824)  . vancomycin      Principal Problem:   Multifocal pneumonia Active Problems:   Myelodysplasia, high grade (HCC)   Ecthyma gangrenosum   Pancytopenia (HCC)   Elevated brain natriuretic peptide (BNP) level   Demand ischemia (HCC)   Aortic atherosclerosis (HCC)   LOS: 1 day   How to contact the [facility] Attending or Consulting provider [place] or covering provider during after hours [place], for this patient?  1. Check the care team in [facility] and look for a) attending/consulting [facility] provider listed and b) the [facility] team listed 2. Log into [email or web address] and use 's universal password to access. If you do not have the password, please contact the hospital operator. 3. Locate the [facility] provider you are looking for under [facility] and page to a number that you can be directly reached. 4. If you still have difficulty reaching the provider, please page the [facility] (Director on Call) for the Hospitalists listed on amion for assistance.

## 2021-03-27 NOTE — Progress Notes (Signed)
Initial Nutrition Assessment  DOCUMENTATION CODES:  Non-severe (moderate) malnutrition in context of chronic illness  INTERVENTION:  Continue current diet order.  Add Ensure Enlive po BID, each supplement provides 350 kcal and 20 grams of protein.  Add MVI with minerals daily.  NUTRITION DIAGNOSIS:  Moderate Malnutrition related to chronic illness,cancer and cancer related treatments as evidenced by mild muscle depletion,mild fat depletion,moderate muscle depletion.  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  Spoke with pt at bedside. Pt reports that he has been feeling some decreased appetite as he is taking tramadol, which he reports has been causing this. He reports eating a little less, but his wife is very adamant on making sure he eats well (chkicken, salmon, beans, vegetables, fruits daily).  He is starting to drink Ensure High Protein at home. Encouraged them not to worry about sugar and carbohydrates at present and normal Ensure is fine to consume for a meal replacement.  He reports a weight loss of 10 lbs in 2 months. Epic reports a 7 lb weight loss in the past 2 months, which is a 4% change in BW.  On exam, pt had depletions in upper trunk and legs especially. Pt reports no difficulty with ambulation.  Recommend Ensure BID to promote caloric and protein intake and MVI with minerals.  Medications: cefepime, vancomycin Labs: reviewed; Na 134, Glucose 172  NUTRITION - FOCUSED PHYSICAL EXAM: Flowsheet Row Most Recent Value  Orbital Region Mild depletion  Upper Arm Region Mild depletion  Thoracic and Lumbar Region No depletion  Buccal Region Mild depletion  Temple Region Moderate  depletion  Clavicle Bone Region Moderate depletion  Clavicle and Acromion Bone Region Mild depletion  Scapular Bone Region No depletion  Dorsal Hand No depletion  Patellar Region Moderate depletion  Anterior Thigh Region Mild depletion  Posterior Calf Region Moderate depletion  Edema (RD Assessment) None  Hair Reviewed  Eyes Reviewed  Mouth Reviewed  Skin Reviewed  Nails Reviewed     Diet Order:   Diet Order            Diet regular Room service appropriate? Yes; Fluid consistency: Thin  Diet effective now                EDUCATION NEEDS:  Education needs have been addressed  Skin:  Skin Assessment: Skin Integrity Issues: Skin Integrity Issues:: Other (Comment) Other: Dry, scattered red spots  Last BM:  03/26/21  Height:  Ht Readings from Last 1 Encounters:  03/26/21 5\' 9"  (1.753 m)   Weight:  Wt Readings from Last 1 Encounters:  03/26/21 72.6 kg   Ideal Body Weight:  72.7 kg  BMI:  Body mass index is 23.64 kg/m.  Estimated Nutritional Needs:  Kcal:  2100-2300 Protein:  85-100 grams Fluid:  >2 L  Derrel Nip, RD, LDN Registered Dietitian After Hours/Weekend Pager # in [place]

## 2021-03-27 NOTE — Hospital Course (Addendum)
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 Admitted for further evaluation of suspected ecthyma gangrenosum, pancytopenia, rule out sepsis, possible multifocal pneumonia.  Received platelets and PRBC, treated with broad-spectrum antibiotics.  Followed by oncology.  Developed increasing shortness of breath, echocardiogram revealed systolic and diastolic CHF.  Seen by cardiology, treated with diuresis with improvement.  Rash remains of unclear etiology.  Will ask for biopsy 5/2.  Seen by infectious disease.  Appears better today.  Likely home next 48 hours if continues to improve.  A & P  Multifocal pneumonia versus CHF, concern for eczema gangrenosum, complicated by pancytopenia, immunosuppression secondary to chemotherapy.  Sepsis ruled out.  CTA chest no PE.  Patchy groundglass confluent opacities right greater than left lobe. -- Clinically better today with decreased shortness of breath and dyspnea.  Oral antibiotics as per ID.  Treatment for CHF as per cardiology.  Rash.  Eczema gangrenosum considered, however does not appear to be the case at this point.  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  Developed rash as an outpatient 4/25, instructed by Dr. Marin Olp to stop Famvir, Diflucan and Cipro. -- No significant changes or progression.  We will ask for biopsy from general surgery tomorrow.  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 Marin Olp.  Being treated with Vidaza, status post cycle 1 started 02/17/2021. -- Platelets, hemoglobin, WBC stable.  Acute combined systolic and diastolic CHF --Echocardiogram showed low normal LVEF of 45-50% with global hypokinesis.  Moderate aortic stenosis was also noted.  However cardiology feels EF is more like 30-35% and aortic stenosis is severe. -- Much improved today.   Can treatment as per cardiology including diuresis, carvedilol and Entresto however the patient is adamant that he does not want to take any heart medications and he does not feel he has any significant heart problems. -- If patient will allow can titrate up carvedilol and follow-up in 2 weeks after discharge with Dr. Einar Gip.  However patient said today he does not want to follow-up with cardiology.  Mild transaminitis  --new last 48 horus  Severe aortic stenosis. --Strongly recommend outpatient follow-up with cardiology  Aortic atherosclerosis -- No treatment indicated at this time.

## 2021-03-27 NOTE — Telephone Encounter (Signed)
No 03/26/21 los   Thomas Frazier

## 2021-03-27 NOTE — Progress Notes (Signed)
Shift Summary:   Blood transfusion delayed during this shift due to patient spiking fever x 2. Patient remained alert and oriented x 4 during this shift. Walked to the bathroom by self. Denied having any pain needs during this shift. Fired a yellow mews during this shift due to HR and Fever. Blood pressure ran soft in the lower 100/80s during this shift. Received 1 unit of PRBc's during this shift/ still due for platelets. MD Sarajane Jews aware. No other needs identified. EKG performed during this shift. Echo completed during this shift.  Will continue to monitor.

## 2021-03-27 NOTE — Progress Notes (Addendum)
   03/27/21 1723  Vitals  Temp (!) 101.4 F (38.6 C)  Temp Source Oral  BP 114/69  MAP (mmHg) 102  BP Location Left Arm  BP Method Automatic  Patient Position (if appropriate) Lying  Pulse Rate (!) 102  ECG Heart Rate (!) 105  Resp 19  Level of Consciousness  Level of Consciousness Alert  MEWS COLOR  MEWS Score Color Yellow  Oxygen Therapy  SpO2 98 %  O2 Device Room Air  Pain Assessment  Pain Scale 0-10  Pain Score 0  Prior to releasing platelets. Fever of 101.4 F  orally, noted. Platelets not released at this time. Will administer tylenol and make MD Sarajane Jews aware.  -2549 Rechecked temperature, temperature now 100.3 F orally. Made MD Sarajane Jews aware via secure method. Patient still due for a unit of platelets at this time.  1904- Per MD Sarajane Jews hold off on platelets for another hour, recheck temperature, in 1 hour. Will pass on to nightshift.

## 2021-03-27 NOTE — Progress Notes (Signed)
   03/27/21 1003  Vitals  Temp (!) 100.4 F (38 C)  Temp Source Oral  BP 125/62  MAP (mmHg) 79  BP Location Right Arm  BP Method Automatic  Patient Position (if appropriate) Lying  Pulse Rate 100  Pulse Rate Source Dinamap  ECG Heart Rate 100  Resp (!) 22  Level of Consciousness  Level of Consciousness Alert  MEWS COLOR  MEWS Score Color Green  Oxygen Therapy  SpO2 98 %  O2 Device Room Air  Pain Assessment  Pain Scale 0-10  Pain Score 0  Pre-transfusion vital signs before releasing blood. Given tylenol and made MD Sarajane Jews aware of temperature of 100.4 orally via secure method. Blood transfusion delayed at this time. Patient alert and oriented. Denying any further needs with family at bedside. Will continue to monitor.

## 2021-03-27 NOTE — Plan of Care (Signed)

## 2021-03-28 ENCOUNTER — Encounter (HOSPITAL_COMMUNITY): Payer: Self-pay | Admitting: Internal Medicine

## 2021-03-28 ENCOUNTER — Inpatient Hospital Stay (HOSPITAL_COMMUNITY): Payer: Self-pay

## 2021-03-28 DIAGNOSIS — J189 Pneumonia, unspecified organism: Secondary | ICD-10-CM

## 2021-03-28 DIAGNOSIS — I5041 Acute combined systolic (congestive) and diastolic (congestive) heart failure: Secondary | ICD-10-CM | POA: Insufficient documentation

## 2021-03-28 DIAGNOSIS — I350 Nonrheumatic aortic (valve) stenosis: Secondary | ICD-10-CM

## 2021-03-28 DIAGNOSIS — I5031 Acute diastolic (congestive) heart failure: Secondary | ICD-10-CM

## 2021-03-28 HISTORY — DX: Acute diastolic (congestive) heart failure: I50.31

## 2021-03-28 LAB — CBC WITH DIFFERENTIAL/PLATELET
Abs Immature Granulocytes: 0.03 10*3/uL (ref 0.00–0.07)
Basophils Absolute: 0 10*3/uL (ref 0.0–0.1)
Basophils Relative: 1 %
Eosinophils Absolute: 0 10*3/uL (ref 0.0–0.5)
Eosinophils Relative: 1 %
HCT: 26.6 % — ABNORMAL LOW (ref 39.0–52.0)
Hemoglobin: 8.7 g/dL — ABNORMAL LOW (ref 13.0–17.0)
Immature Granulocytes: 2 %
Lymphocytes Relative: 78 %
Lymphs Abs: 1.6 10*3/uL (ref 0.7–4.0)
MCH: 28.1 pg (ref 26.0–34.0)
MCHC: 32.7 g/dL (ref 30.0–36.0)
MCV: 85.8 fL (ref 80.0–100.0)
Monocytes Absolute: 0.2 10*3/uL (ref 0.1–1.0)
Monocytes Relative: 8 %
Neutro Abs: 0.2 10*3/uL — CL (ref 1.7–7.7)
Neutrophils Relative %: 10 %
Platelets: 23 10*3/uL — CL (ref 150–400)
RBC: 3.1 MIL/uL — ABNORMAL LOW (ref 4.22–5.81)
RDW: 26 % — ABNORMAL HIGH (ref 11.5–15.5)
WBC: 2 10*3/uL — ABNORMAL LOW (ref 4.0–10.5)
nRBC: 44.6 % — ABNORMAL HIGH (ref 0.0–0.2)

## 2021-03-28 LAB — BPAM RBC
Blood Product Expiration Date: 202205212359
ISSUE DATE / TIME: 202204281158
Unit Type and Rh: 5100

## 2021-03-28 LAB — BPAM PLATELET PHERESIS
Blood Product Expiration Date: 202205022359
ISSUE DATE / TIME: 202204282207
Unit Type and Rh: 600

## 2021-03-28 LAB — TYPE AND SCREEN
ABO/RH(D): O POS
Antibody Screen: NEGATIVE
Unit division: 0

## 2021-03-28 LAB — COMPREHENSIVE METABOLIC PANEL
ALT: 36 U/L (ref 0–44)
AST: 29 U/L (ref 15–41)
Albumin: 2.8 g/dL — ABNORMAL LOW (ref 3.5–5.0)
Alkaline Phosphatase: 75 U/L (ref 38–126)
Anion gap: 9 (ref 5–15)
BUN: 20 mg/dL (ref 6–20)
CO2: 25 mmol/L (ref 22–32)
Calcium: 8.8 mg/dL — ABNORMAL LOW (ref 8.9–10.3)
Chloride: 103 mmol/L (ref 98–111)
Creatinine, Ser: 0.98 mg/dL (ref 0.61–1.24)
GFR, Estimated: >60 mL/min (ref >60)
Glucose, Bld: 122 mg/dL — ABNORMAL HIGH (ref 70–99)
Potassium: 3.6 mmol/L (ref 3.5–5.1)
Sodium: 137 mmol/L (ref 135–145)
Total Bilirubin: 0.8 mg/dL (ref 0.3–1.2)
Total Protein: 7.4 g/dL (ref 6.5–8.1)

## 2021-03-28 LAB — PREPARE PLATELET PHERESIS: Unit division: 0

## 2021-03-28 LAB — MRSA PCR SCREENING: MRSA by PCR: NEGATIVE

## 2021-03-28 LAB — LACTATE DEHYDROGENASE: LDH: 272 U/L — ABNORMAL HIGH (ref 98–192)

## 2021-03-28 MED ORDER — DIGOXIN 125 MCG PO TABS
0.2500 mg | ORAL_TABLET | Freq: Every day | ORAL | Status: DC
  Administered 2021-03-28 – 2021-03-31 (×3): 0.25 mg via ORAL
  Filled 2021-03-28 (×4): qty 2

## 2021-03-28 MED ORDER — SACUBITRIL-VALSARTAN 24-26 MG PO TABS
1.0000 | ORAL_TABLET | Freq: Two times a day (BID) | ORAL | Status: DC
  Administered 2021-03-28 – 2021-03-31 (×4): 1 via ORAL
  Filled 2021-03-28 (×8): qty 1

## 2021-03-28 MED ORDER — POTASSIUM CHLORIDE CRYS ER 10 MEQ PO TBCR
10.0000 meq | EXTENDED_RELEASE_TABLET | Freq: Three times a day (TID) | ORAL | Status: AC
  Administered 2021-03-28 – 2021-03-30 (×6): 10 meq via ORAL
  Filled 2021-03-28 (×6): qty 1

## 2021-03-28 MED ORDER — FUROSEMIDE 10 MG/ML IJ SOLN
20.0000 mg | Freq: Once | INTRAMUSCULAR | Status: AC
  Administered 2021-03-28: 20 mg via INTRAVENOUS
  Filled 2021-03-28: qty 2

## 2021-03-28 MED ORDER — CARVEDILOL 3.125 MG PO TABS
3.1250 mg | ORAL_TABLET | Freq: Two times a day (BID) | ORAL | Status: DC
  Administered 2021-03-28 – 2021-03-31 (×5): 3.125 mg via ORAL
  Filled 2021-03-28 (×6): qty 1

## 2021-03-28 MED ORDER — FUROSEMIDE 10 MG/ML IJ SOLN
20.0000 mg | Freq: Three times a day (TID) | INTRAMUSCULAR | Status: AC
  Administered 2021-03-28 – 2021-03-30 (×6): 20 mg via INTRAVENOUS
  Filled 2021-03-28 (×6): qty 2

## 2021-03-28 NOTE — Progress Notes (Signed)
Made MD Sarajane Jews aware face to face of neutrophils being 0.2, and platelets being 23. Will continue to monitor.

## 2021-03-28 NOTE — Progress Notes (Signed)
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  Admitted for further evaluation of suspected ecthyma gangrenosum, pancytopenia, rule out sepsis, possible multifocal pneumonia.  Treating with broad-spectrum antibiotics, remains short of breath, with recent fever, echocardiogram showed mild CHF.  Clinical picture remains unclear.  Oncology and cardiology consulted. A & P  Multifocal pneumonia, concern for eczema gangrenosum, rule out sepsis, complicated by pancytopenia, immunosuppression secondary to chemotherapy.  CTA chest no PE.  Patchy groundglass confluent opacities right greater than left lobe. --Remains tachypneic and dyspneic but not hypoxic.  Continue empiric antibiotics.  Ecthyma gangrenosum?  Rash as an outpatient 4/25, instructed by Dr. Marin Olp to stop Famvir, Diflucan and Cipro. --No ulcerations yet noted.  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 Marin Olp.  Being treated with Vidaza, status post cycle 1 started 02/17/2021.  Acute diastolic CHF with pulmonary edema, elevated BNP, troponin elevated consistent with demand ischemia, CT showed borderline cardiomegaly and coronary artery calcifications as well as aortic atherosclerosis --Echocardiogram showed low normal LVEF of 25-30% with global hypokinesis.  Moderate aortic stenosis was also noted. --Given global hypokinesis inpatient with no previous known cardiac disease, unclear clinical picture with rash, dyspnea, fever we will consult cardiology for assistance with management. --IV Lasix x1  Aortic atherosclerosis -- No  treatment indicated at this time.  Disposition Plan:  Discussion: Prognosis guarded, clinical picture unclear.  IV Lasix x1.  Status is: Inpatient  Remains inpatient appropriate because:Ongoing diagnostic testing needed not appropriate for outpatient work up, IV treatments appropriate due to intensity of illness or inability to take PO and Inpatient level of care appropriate due to severity of illness   Dispo: The patient is from: Home              Anticipated d/c is to: Home              Patient currently is not medically stable to d/c.   Difficult to place patient No  DVT prophylaxis: SCDs Start: 03/26/21 2227   Code Status: Full Code Level of care: Telemetry Family Communication: as above  Murray Hodgkins, MD  [facility] Direct contact: see [email or web address] (further directions at bottom of note if needed) 7PM-7AM contact night coverage as at bottom of note 03/28/2021, 3:37 PM  LOS: 2 days   Significant Hospital Events   . 4/27 admit for pneumonia, pancytopenia, rash   Consults:  . Oncology    Procedures:  .   Significant Diagnostic Tests:  . CT chest multifocal pneumonia . Echo LVEF 45-50% global hypokinesis, moderate AS   Micro Data:  . 4/27 BC >   Antimicrobials:  . Cefepime 4/27 >  . Vancomycin 4/27 >  Interval History/Subjective  CC: f/u rash, SOB  Reports being short of breath, tires very quickly when speaking very even moving about.  No pain.  No nausea or vomiting.  No appetite but he is eating to maintain strength.  No history of cardiac problems.  Objective   Vitals:  Vitals:   03/28/21 1222 03/28/21 1436  BP: 119/64   Pulse: 97   Resp: 20   Temp: 98 F (36.7 C)   SpO2: 95% 98%    Exam: Constitutional:   .  Appears calm and comfortable ENMT:  . grossly normal hearing  Respiratory:  . CTA bilaterally, no w/r/r.  . Respiratory effort normal Cardiovascular:  . RRR, 2/6 systolic murmur, no r/g. . No LE extremity edema   Abdomen:   . soft Skin:  . No significant change in rash today especially right thigh left forearm head neck back and scalp Psychiatric:  . Mental status o Mood, affect appropriate   Today's Data  . +1.2 L since admission . Complete metabolic panel unremarkable . Hemoglobin up appropriately to 8.7 status post transfusion.  WBC improved to 2.0.  Platelets up to 23 status post transfusion.  Chest x-ray independently reviewed shows pulmonary edema.  Scheduled Meds: . feeding supplement  237 mL Oral BID BM  . multivitamin with minerals  1 tablet Oral Daily   Continuous Infusions: . ceFEPime (MAXIPIME) IV 2 g (03/28/21 1512)  . vancomycin 1,000 mg (03/28/21 1353)    Principal Problem:   Multifocal pneumonia Active Problems:   Myelodysplasia, high grade (HCC)   Ecthyma gangrenosum   Pancytopenia (HCC)   Elevated brain natriuretic peptide (BNP) level   Demand ischemia (HCC)   Aortic atherosclerosis (HCC)   Malnutrition of moderate degree   Moderate aortic stenosis   Acute diastolic CHF (congestive heart failure) ([place])   LOS: 2 days   How to contact the [facility] Attending or Consulting provider [place] or covering provider during after hours [place], for this patient?  1. Check the care team in [facility] and look for a) attending/consulting [facility] provider listed and b) the [facility] team listed 2. Log into [email or web address] and use [place]'s universal password to access. If you do not have the password, please contact the hospital operator. 3. Locate the [facility] provider you are looking for under [facility] and page to a number that you can be directly reached. 4. If you still have difficulty reaching the provider, please page the [facility] (Director on Call) for the Hospitalists listed on amion for assistance.

## 2021-03-28 NOTE — Plan of Care (Signed)

## 2021-03-28 NOTE — Consult Note (Signed)
CARDIOLOGY CONSULT NOTE  Patient ID: Thomas Frazier MRN: [id number] DOB/AGE: 02-May-1966 55 y.o.  Admit date: 03/26/2021 Referring Physician  Standley Brooking, MD Primary Physician:  Marya Landry Reason for Consultation:   Patient ID: Thomas Frazier, male    DOB: 05/29/1966, 55 y.o.   MRN: [id number]  Chief Complaint  Patient presents with  . Rash  . Shortness of Breath   HPI:    Thomas Frazier  is a 55 y.o. 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  Patient denies significant cardiovascular risk factors.  No history of hypertension, hyperlipidemia, diabetes, no history of premature CAD.  No known history of CAD. Patient is currently undergoing treatment with Vidaza per oncology.  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  Evaluation in the emergency department revealed bilateral groundglass opacities on CT of the chest concerning for multifocal pneumonia, therefore patient was started on antibiotics.  In the emergency department patient's troponin mildly elevated, but trended down and BNP high at 1238.  Upon presentation patient also complained of worsening shortness of breath over the last few days.  Since presentation to the emergency department patient's dyspnea has not improved and recent chest x-ray reveals bilateral small lung effusions as well as vascular congestion.  Echocardiogram revealed new combined systolic and diastolic heart failure with mildly reduced LVEF of 45-50%, as well as mild to moderate aortic stenosis.  Patient seen and examined at bedside, with family present.  He reports dyspnea has not improved at all since admission.  Denies chest pain, PND, orthopnea.  Past Medical History:  Diagnosis Date  . Acute diastolic CHF (congestive heart failure) (HCC) 03/28/2021  . Aortic atherosclerosis (HCC) 03/27/2021  . 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 APPENDECTOMY     Family History  Family history unknown: Yes   Social History   Tobacco Use  . Smoking status: Never Smoker  . Smokeless tobacco: Never Used  Substance Use Topics  . Alcohol use: Not Currently    Marital Sttus: Married  ROS  Review of Systems  Constitutional: Positive for decreased appetite and malaise/fatigue. Negative for weight gain.  Cardiovascular: Positive for dyspnea on exertion. Negative for chest pain, claudication, leg swelling, near-syncope, orthopnea, palpitations, paroxysmal nocturnal dyspnea and syncope.  Respiratory: Positive for shortness of breath.   Hematologic/Lymphatic: Does not bruise/bleed easily.  Skin: Positive for rash.  Gastrointestinal: Negative for melena.  Neurological: Negative for dizziness and weakness.  All other systems reviewed and are negative.  Objective   Vitals with BMI 03/28/2021 03/28/2021 03/28/2021  Height - - -  Weight - - -  BMI - - -  Systolic 119 112 771  Diastolic 64 61 62  Pulse 97 96 94    Blood pressure 119/64, pulse 97, temperature 98 F (36.7 C), temperature source Oral, resp. rate 20, height 5\' 9"  (1.753 m), weight 72.6 kg, SpO2 98 %.    Physical Exam Vitals reviewed.  Constitutional:      General: He is not in acute distress.    Appearance: He is ill-appearing.  HENT:     Head: Normocephalic and atraumatic.     Right Ear: External ear normal.     Left Ear: External ear normal.     Nose: Nose normal.     Mouth/Throat:     Mouth: Mucous membranes are moist.  Eyes:  Extraocular Movements: Extraocular movements intact.     Conjunctiva/sclera: Conjunctivae normal.  Neck:     Vascular: No carotid bruit.  Cardiovascular:     Rate and Rhythm: Regular rhythm. Tachycardia present.     Pulses: Intact distal pulses.     Heart sounds: S1 normal and S2 normal. Murmur heard.   Harsh midsystolic murmur is present with  a grade of 3/6 at the upper right sternal border. No gallop.   Pulmonary:     Effort: No respiratory distress.     Breath sounds: Rales (bilateral lower lung fields) present. No wheezing or rhonchi.     Comments: Significant dyspnea while talking and eating.  Abdominal:     General: Bowel sounds are normal. There is no distension.     Palpations: Abdomen is soft.  Musculoskeletal:     Right lower leg: No edema.     Left lower leg: No edema.  Skin:    General: Skin is warm and dry.     Findings: Rash present.  Neurological:     General: No focal deficit present.     Mental Status: He is alert and oriented to person, place, and time.  Psychiatric:        Mood and Affect: Mood normal.        Behavior: Behavior normal.    Laboratory examination:   Recent Labs    03/26/21 1051 03/27/21 0435 03/28/21 0905  NA 132* 134* 137  K 4.6 4.1 3.6  CL 97* 103 103  CO2 28 23 25   GLUCOSE 155* 172* 122*  BUN 23* 17 20  CREATININE 1.00 0.88 0.98  CALCIUM 9.6 8.7* 8.8*  GFRNONAA >60 >60 >60   estimated creatinine clearance is 84.2 mL/min (by C-G formula based on SCr of 0.98 mg/dL).  CMP Latest Ref Rng & Units 03/28/2021 03/27/2021 03/26/2021  Glucose 70 - 99 mg/dL 122(H) 172(H) 155(H)  BUN 6 - 20 mg/dL 20 17 23(H)  Creatinine 0.61 - 1.24 mg/dL 0.98 0.88 1.00  Sodium 135 - 145 mmol/L 137 134(L) 132(L)  Potassium 3.5 - 5.1 mmol/L 3.6 4.1 4.6  Chloride 98 - 111 mmol/L 103 103 97(L)  CO2 22 - 32 mmol/L 25 23 28   Calcium 8.9 - 10.3 mg/dL 8.8(L) 8.7(L) 9.6  Total Protein 6.5 - 8.1 g/dL 7.4 6.8 7.6  Total Bilirubin 0.3 - 1.2 mg/dL 0.8 1.0 1.1  Alkaline Phos 38 - 126 U/L 75 65 73  AST 15 - 41 U/L 29 22 21   ALT 0 - 44 U/L 36 32 32   CBC Latest Ref Rng & Units 03/28/2021 03/27/2021 03/26/2021  WBC 4.0 - 10.5 K/uL 2.0(L) 1.7(L) 1.7(L)  Hemoglobin 13.0 - 17.0 g/dL 8.7(L) 6.7(LL) 7.6(L)  Hematocrit 39.0 - 52.0 % 26.6(L) 21.3(L) 23.1(L)  Platelets 150 - 400 K/uL 23(LL) 12(LL) 14(L)   Lipid  Panel No results for input(s): CHOL, TRIG, LDLCALC, VLDL, HDL, CHOLHDL, LDLDIRECT in the last 8760 hours.  HEMOGLOBIN A1C No results found for: HGBA1C, MPG TSH No results for input(s): TSH in the last 8760 hours. BNP (last 3 results) Recent Labs    03/26/21 1759  BNP 1,238.3*   Results for orders placed or performed during the hospital encounter of 03/26/21 (from the past 48 hour(s))  Brain natriuretic peptide     Status: Abnormal   Collection Time: 03/26/21  5:59 PM  Result Value Ref Range   B Natriuretic Peptide 1,238.3 (H) 0.0 - 100.0 pg/mL    Comment: Performed at [facility]  [employer], [address] [place], [place] [address]  HIV Antibody (routine testing w rflx)     Status: None   Collection Time: 03/26/21 11:35 PM  Result Value Ref Range   HIV Screen 4th Generation wRfx Non Reactive Non Reactive    Comment: Performed at [facility], [place] [address], [place], [place] [address]  Troponin I (High Sensitivity)     Status: Abnormal   Collection Time: 03/26/21 11:35 PM  Result Value Ref Range   Troponin I (High Sensitivity) 42 (H) <18 ng/L    Comment: (NOTE) Elevated high sensitivity troponin I (hsTnI) values and significant  changes across serial measurements may suggest ACS but many other  chronic and acute conditions are known to elevate hsTnI results.  Refer to the "Links" section for chest pain algorithms and additional  guidance. Performed at [facility], [place] [address], [place], [place] [address]   CBC WITH DIFFERENTIAL     Status: Abnormal   Collection Time: 03/27/21  3:21 AM  Result Value Ref Range   WBC 1.7 (L) 4.0 - 10.5 K/uL   RBC 2.45 (L) 4.22 - 5.81 MIL/uL   Hemoglobin 6.7 (LL) 13.0 - 17.0 g/dL    Comment: This critical result has verified and been called to RONI S. by [facility] on 04 28 2022 at 0422, and has been read back.    HCT 21.3 (L) 39.0 - 52.0 %   MCV 86.9 80.0 - 100.0 fL   MCH 27.3 26.0 - 34.0 pg   MCHC 31.5  30.0 - 36.0 g/dL   RDW 28.2 (H) 11.5 - 15.5 %   Platelets 12 (LL) 150 - 400 K/uL    Comment: Immature Platelet Fraction may be clinically indicated, consider ordering this additional test [id number] THIS CRITICAL RESULT HAS VERIFIED AND BEEN CALLED TO RONI S. BY JOLANDE MECIAL ON 04 28 2022 AT 0422, AND HAS BEEN READ BACK.     nRBC 44.2 (H) 0.0 - 0.2 %   Neutrophils Relative % 14 %   Neutro Abs 0.2 (LL) 1.7 - 7.7 K/uL    Comment: This critical result has verified and been called to RONI S. by [facility] on 04 28 2022 at 0422, and has been read back.    Lymphocytes Relative 78 %   Lymphs Abs 1.3 0.7 - 4.0 K/uL   Monocytes Relative 6 %   Monocytes Absolute 0.1 0.1 - 1.0 K/uL   Eosinophils Relative 0 %   Eosinophils Absolute 0.0 0.0 - 0.5 K/uL   Basophils Relative 1 %   Basophils Absolute 0.0 0.0 - 0.1 K/uL   Immature Granulocytes 1 %   Abs Immature Granulocytes 0.02 0.00 - 0.07 K/uL   Reactive, Benign Lymphocytes PRESENT    Acanthocytes PRESENT    Polychromasia PRESENT     Comment: Performed at [facility], [place] [address] [place], [place] [address]  Basic metabolic panel     Status: Abnormal   Collection Time: 03/27/21  4:35 AM  Result Value Ref Range   Sodium 134 (L) 135 - 145 mmol/L   Potassium 4.1 3.5 - 5.1 mmol/L   Chloride 103 98 - 111 mmol/L   CO2 23 22 - 32 mmol/L   Glucose, Bld 172 (H) 70 - 99 mg/dL    Comment: Glucose reference range applies only to samples taken after fasting for at least 8 hours.   BUN 17 6 - 20 mg/dL   Creatinine, Ser 0.88  0.61 - 1.24 mg/dL   Calcium 8.7 (L) 8.9 - 10.3 mg/dL   GFR, Estimated >60 >60 mL/min    Comment: (NOTE) Calculated using the CKD-EPI Creatinine Equation (2021)    Anion gap 8 5 - 15    Comment: Performed at [facility], [place] [address], [place], [place] [address]  Hepatic function panel     Status: Abnormal   Collection Time: 03/27/21  4:35 AM  Result Value Ref Range   Total Protein  6.8 6.5 - 8.1 g/dL   Albumin 2.7 (L) 3.5 - 5.0 g/dL   AST 22 15 - 41 U/L   ALT 32 0 - 44 U/L   Alkaline Phosphatase 65 38 - 126 U/L   Total Bilirubin 1.0 0.3 - 1.2 mg/dL   Bilirubin, Direct 0.2 0.0 - 0.2 mg/dL   Indirect Bilirubin 0.8 0.3 - 0.9 mg/dL    Comment: Performed at [facility], [place] [address], [place], [place] [address]  Troponin I (High Sensitivity)     Status: Abnormal   Collection Time: 03/27/21  4:35 AM  Result Value Ref Range   Troponin I (High Sensitivity) 37 (H) <18 ng/L    Comment: (NOTE) Elevated high sensitivity troponin I (hsTnI) values and significant  changes across serial measurements may suggest ACS but many other  chronic and acute conditions are known to elevate hsTnI results.  Refer to the "Links" section for chest pain algorithms and additional  guidance. Performed at [facility], [place] [address], [place], [place] [address]   Prepare RBC (crossmatch)     Status: None   Collection Time: 03/27/21  5:53 AM  Result Value Ref Range   Order Confirmation      ORDER PROCESSED BY BLOOD BANK Performed at [facility], [place] [address], [place], [place] [address]   Type and screen [place]     Status: None   Collection Time: 03/27/21  5:53 AM  Result Value Ref Range   ABO/RH(D) O POS    Antibody Screen NEG    Sample Expiration 03/30/2021,2359    Unit Number [id number]    Blood Component Type RED CELLS,LR    Unit division 00    Status of Unit ISSUED,FINAL    Transfusion Status OK TO TRANSFUSE    Crossmatch Result      Compatible Performed at [facility], [place] [address], [place], [place] [address]   Prepare Pheresed Platelets     Status: None   Collection Time: 03/27/21  9:41 AM  Result Value Ref Range   Unit Number [id number]    Blood Component Type PLTP2 PSORALEN TREATED    Unit division 00    Status of Unit ISSUED,FINAL    Transfusion Status       OK TO TRANSFUSE Performed at [place] [address], [place], [place] [address]   CBC with Differential/Platelet     Status: Abnormal   Collection Time: 03/28/21  9:05 AM  Result Value Ref Range   WBC 2.0 (L) 4.0 - 10.5 K/uL   RBC 3.10 (L) 4.22 - 5.81 MIL/uL   Hemoglobin 8.7 (L) 13.0 - 17.0 g/dL    Comment: POST TRANSFUSION SPECIMEN   HCT 26.6 (L) 39.0 - 52.0 %   MCV 85.8 80.0 - 100.0 fL   MCH 28.1 26.0 - 34.0 pg   MCHC 32.7 30.0 - 36.0 g/dL   RDW 26.0 (H) 11.5 - 15.5 %  Platelets 23 (LL) 150 - 400 K/uL    Comment: Immature Platelet Fraction may be clinically indicated, consider ordering this additional test [id number]    nRBC 44.6 (H) 0.0 - 0.2 %   Neutrophils Relative % 10 %   Neutro Abs 0.2 (LL) 1.7 - 7.7 K/uL    Comment: This critical result has verified and been called to Hoover Brunette RN by Marvell Fuller on 04 29 2022 at 0959, and has been read back. CRITICAL RESULT VERIFIED   Lymphocytes Relative 78 %   Lymphs Abs 1.6 0.7 - 4.0 K/uL   Monocytes Relative 8 %   Monocytes Absolute 0.2 0.1 - 1.0 K/uL   Eosinophils Relative 1 %   Eosinophils Absolute 0.0 0.0 - 0.5 K/uL   Basophils Relative 1 %   Basophils Absolute 0.0 0.0 - 0.1 K/uL   Immature Granulocytes 2 %   Abs Immature Granulocytes 0.03 0.00 - 0.07 K/uL   Abnormal Lymphocytes Present PRESENT    Acanthocytes PRESENT    Schistocytes PRESENT    Dimorphism PRESENT    Target Cells PRESENT    Giant PLTs PRESENT     Comment: Performed at [facility], [place] [address], [place], [place] [address]  Comprehensive metabolic panel     Status: Abnormal   Collection Time: 03/28/21  9:05 AM  Result Value Ref Range   Sodium 137 135 - 145 mmol/L   Potassium 3.6 3.5 - 5.1 mmol/L   Chloride 103 98 - 111 mmol/L   CO2 25 22 - 32 mmol/L   Glucose, Bld 122 (H) 70 - 99 mg/dL    Comment: Glucose reference range applies only to samples taken after fasting for at least 8 hours.   BUN 20 6 - 20  mg/dL   Creatinine, Ser 0.98 0.61 - 1.24 mg/dL   Calcium 8.8 (L) 8.9 - 10.3 mg/dL   Total Protein 7.4 6.5 - 8.1 g/dL   Albumin 2.8 (L) 3.5 - 5.0 g/dL   AST 29 15 - 41 U/L   ALT 36 0 - 44 U/L   Alkaline Phosphatase 75 38 - 126 U/L   Total Bilirubin 0.8 0.3 - 1.2 mg/dL   GFR, Estimated >60 >60 mL/min    Comment: (NOTE) Calculated using the CKD-EPI Creatinine Equation (2021)    Anion gap 9 5 - 15    Comment: Performed at [facility], [place] [address] [place], [place] [address]  Lactate dehydrogenase     Status: Abnormal   Collection Time: 03/28/21  9:05 AM  Result Value Ref Range   LDH 272 (H) 98 - 192 U/L    Comment: Performed at [facility], [place] [address] [place], [place] [address]  Culture, blood (Routine X 2) w Reflex to ID Panel     Status: None (Preliminary result)   Collection Time: 03/28/21 11:46 AM   Specimen: BLOOD  Result Value Ref Range   Specimen Description      BLOOD BLOOD LEFT HAND Performed at [facility], [place] [address] [place], [place] [address]    Special Requests      BOTTLES DRAWN AEROBIC AND ANAEROBIC Blood Culture adequate volume Performed at [place] [address] [place], [place] [address]    Culture PENDING    Report Status PENDING   MRSA PCR Screening     Status: None   Collection Time: 03/28/21 12:18 PM   Specimen: Nasal Mucosa; Nasopharyngeal  Result Value Ref Range  MRSA by PCR NEGATIVE NEGATIVE    Comment:        The GeneXpert MRSA Assay (FDA approved for NASAL specimens only), is one component of a comprehensive MRSA colonization surveillance program. It is not intended to diagnose MRSA infection nor to guide or monitor treatment for MRSA infections. Performed at [facility], [address] [place], [place] [id number]     Medications and allergies   Allergies  Allergen Reactions  . Ciprofloxacin Rash  . Diflucan [Fluconazole] Rash  .  Famvir [Famciclovir] Rash     Current Meds  Medication Sig  . azaCITIDine (VIDAZA) 100 MG SUSR Vidaza 142.5 mg (rounded from 143.25 mg)=75 mg/m2 x 1.91 m2 daily times five days every twenty eight days (Patient taking differently: Inject 100 mg into the vein See admin instructions. Vidaza 142.5 mg (rounded from 143.25 mg)=75 mg/m2 x 1.91 m2 daily times five days every twenty eight days)  . Multiple Vitamins-Iron (MULTIVITAMIN/IRON PO) Take 1 tablet by mouth daily.  . prochlorperazine (COMPAZINE) 10 MG tablet Take 1 tablet (10 mg total) by mouth every 6 (six) hours as needed (Nausea or vomiting). (Patient taking differently: Take 10 mg by mouth every 6 (six) hours as needed for nausea or vomiting (Nausea or vomiting).)  . traMADol (ULTRAM) 50 MG tablet Take 1-2 tablets (50-100 mg total) by mouth every 6 (six) hours as needed. (Patient taking differently: Take 50-100 mg by mouth every 6 (six) hours as needed for severe pain.)    Scheduled Meds: . carvedilol  3.125 mg Oral BID WC  . feeding supplement  237 mL Oral BID BM  . multivitamin with minerals  1 tablet Oral Daily   Continuous Infusions: . ceFEPime (MAXIPIME) IV 2 g (03/28/21 1512)  . vancomycin 1,000 mg (03/28/21 1353)   PRN Meds:.acetaminophen **OR** acetaminophen, traMADol   I/O last 3 completed shifts: In: 2508.9 [P.O.:840; I.V.:60; Blood:416; IV Piggyback:1192.9] Out: 1250 [Urine:1250] No intake/output data recorded.    Radiology:   DG Chest 2 View 03/26/2021:   The heart size and mediastinal contours are within normal limits. Mild bibasilar subsegmental atelectasis is noted. No pneumothorax or pleural effusion is noted. The visualized skeletal structures are unremarkable.  IMPRESSION: Mild bibasilar subsegmental atelectasis.   CT Angio Chest PE W/Cm &/Or Wo Cm 03/26/2021:  Cardiovascular: There are no filling defects within the pulmonary arteries to suggest pulmonary embolus. Thoracic aorta is normal in caliber. Coronary  artery calcifications or stents. Aortic valvular calcifications. Borderline cardiomegaly. Trace pericardial effusion anteriorly.  Mediastinum/Nodes: Enlarged right hilar lymph nodes measuring up to 16 mm. Prominent left hilar nodes measuring up to 7 mm. Scattered prominent mediastinal nodes, largest subcarinal measuring 14 mm. No esophageal wall thickening. No thyroid nodule. Lungs/Pleura: Subpleural consolidation in the anterior left upper lobe has mild surrounding ground-glass, series 7, image 41. Smaller subpleural consolidation in the anterior right upper lobe, series 7, image 44. Patchy, ground-glass, and confluent opacities in the right greater than left lower lobe. No significant pleural effusion. Trachea and central bronchi are patent. Upper Abdomen: No acute or unexpected findings.  Musculoskeletal: There are no acute or suspicious osseous abnormalities. Review of the MIP images confirms the above findings.   IMPRESSION:  1. No pulmonary embolus.  2. Patchy, ground-glass, and confluent opacities in the right greater than left lower lobe. There also subpleural opacities involving the anterior left greater than right upper lobes. Findings are suspicious for multifocal pneumonia. Recommend radiographic follow-up to resolution.  3. Prominent right greater than left  hilar nodes and prominent subcarinal node, likely reactive. 4. Borderline cardiomegaly.  Coronary artery calcifications. Aortic Atherosclerosis  DG CHEST PORT 1 VIEW 03/28/2021 Cardiac enlargement. Progressive vascular congestion and mild interstitial edema. Progressive bibasilar airspace disease. Small bilateral effusions.  IMPRESSION: Congestive heart failure with interval worsening. Progression of bibasilar atelectasis.  Cardiac Studies:   ECHOCARDIOGRAM COMPLETE 03/27/2021:  1. Left ventricular ejection fraction, by estimation, is 45 to 50%. The left ventricle has mildly decreased function. The left ventricle demonstrates global  hypokinesis. The left ventricular internal cavity size was mildly dilated. Left ventricular diastolic parameters are consistent with Grade II diastolic dysfunction (pseudonormalization). Elevated left atrial pressure.   2. Right ventricular systolic function is normal. The right ventricular size is mildly enlarged. There is mildly elevated pulmonary artery systolic pressure. The estimated right ventricular systolic pressure is 83.1 mmHg.   3. Left atrial size was mildly dilated.   4. The mitral valve is grossly normal. Trivial mitral valve regurgitation. No evidence of mitral stenosis.   5. The aortic valve is tricuspid. There is moderate calcification of the aortic valve. There is moderate thickening of the aortic valve. Aortic valve regurgitation is mild to moderate. Moderate aortic valve stenosis. Aortic valve area, by VTI measures 1.28 cm. Aortic valve mean gradient measures 23.3 mmHg. Aortic valve Vmax measures 3.12 m/s.  6. The inferior vena cava is normal in size with greater than 50% respiratory variability, suggesting right atrial pressure of 3 mmHg.  7. Trivial pericardial effusion is present.   EKG: EKG 03/19/2021: Sinus tachycardia with single PVC at a rate of 106 bpm.  Normal axis.  Nonspecific T wave abnormality.  Assessment  Kolbey Teichert  is a 55 y.o. 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  Patient denies significant cardiovascular risk factors.  No history of hypertension, hyperlipidemia, diabetes, no history of premature CAD.  No known history of CAD. Patient is currently undergoing treatment with Vidaza per oncology.  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   Acute combined systolic and diastolic heart failure Aortic stenosis/aortic regurgitation Elevated BNP Elevated troponin Aortic atherosclerosis Multifocal pneumonia Pancytopenia  Recommendations:   Acute combined systolic and  diastolic heart failure Echocardiogram reveals mildly reduced LVEF with left ventricular global hypokinesis as well as grade 2 diastolic dysfunction.  Patient's blood pressure is soft and he is consistently tachycardic.  Suspect underlying sepsis or systemic inflammation to be underlying etiology of heart failure.  Although patient may benefit from ischemic work-up on an outpatient basis.  Would recommend management of current acute comorbidities, and suspect heart function will improve subsequently.  As pressures are soft Will start patient on carvedilol 3.125 mg twice daily.  As blood pressure allows would recommend initiation of guideline directed medical therapy for heart failure.  Notably Vidaza cannot be excluded as underlying etiology of heart failure, however suspicion is low.  Aortic stenosis/aortic regurgitation We will continue to monitor on an outpatient basis.  Recommend outpatient cardiology follow-up.   Elevated BNP Patient's BNP is elevated, however on exam he appears euvolemic.  Suspect BNP elevation may be secondary to patient receiving fluids upon admission to the hospital.  We will trend BNP, expect this to improve as patient's underlying systemic inflammatory response is treated.  Elevated troponin Troponin was mildly elevated initially, has trended down.  Likely type II MI secondary to increased demand in the setting of acute infection and heart failure.  Although in the context of systemic inflammatory response,  pericarditis cannot be ruled out, although suspicion is low.  Aortic atherosclerosis Management on an outpatient basis.  Pancytopenia Defer management to oncology and primary team.  Multifocal pneumonia Will defer management to primary team.  Likely present heart failure is secondary to underlying systemic inflammatory response and therefore treatment of underlying infection will likely allow improvement of heart function.   Patient was seen in collaboration  with Dr. Einar Gip. He also reviewed patient's chart and examined the patient. Dr. Einar Gip is in agreement of the plan.    [name], PA-C 03/28/2021, 5:12 PM Office: [phone]

## 2021-03-28 NOTE — Progress Notes (Addendum)
Called to the room by family due. Family endorsed, "breathing." Patient in bed resting with RR even and unlabored. Checked O2 saturation. Patient sat's are 96% on room air. Placed on 1L of nasal cannula for comfort. Patient endorses, he still does not feel better. Will make MD Sarajane Jews aware. Also offered use interpreter for plan of care explaining. Patient refused, and endorsed, "  I don't think it would make a difference."

## 2021-03-29 ENCOUNTER — Inpatient Hospital Stay (HOSPITAL_COMMUNITY): Payer: Self-pay

## 2021-03-29 DIAGNOSIS — I350 Nonrheumatic aortic (valve) stenosis: Secondary | ICD-10-CM

## 2021-03-29 DIAGNOSIS — R21 Rash and other nonspecific skin eruption: Secondary | ICD-10-CM

## 2021-03-29 DIAGNOSIS — L270 Generalized skin eruption due to drugs and medicaments taken internally: Secondary | ICD-10-CM

## 2021-03-29 DIAGNOSIS — D849 Immunodeficiency, unspecified: Secondary | ICD-10-CM

## 2021-03-29 DIAGNOSIS — I509 Heart failure, unspecified: Secondary | ICD-10-CM

## 2021-03-29 DIAGNOSIS — I5041 Acute combined systolic (congestive) and diastolic (congestive) heart failure: Secondary | ICD-10-CM

## 2021-03-29 LAB — CBC WITH DIFFERENTIAL/PLATELET
Abs Immature Granulocytes: 0.03 10*3/uL (ref 0.00–0.07)
Basophils Absolute: 0 10*3/uL (ref 0.0–0.1)
Basophils Relative: 1 %
Eosinophils Absolute: 0 10*3/uL (ref 0.0–0.5)
Eosinophils Relative: 1 %
HCT: 22.6 % — ABNORMAL LOW (ref 39.0–52.0)
Hemoglobin: 7.3 g/dL — ABNORMAL LOW (ref 13.0–17.0)
Immature Granulocytes: 2 %
Lymphocytes Relative: 77 %
Lymphs Abs: 1.5 10*3/uL (ref 0.7–4.0)
MCH: 28.2 pg (ref 26.0–34.0)
MCHC: 32.3 g/dL (ref 30.0–36.0)
MCV: 87.3 fL (ref 80.0–100.0)
Monocytes Absolute: 0.2 10*3/uL (ref 0.1–1.0)
Monocytes Relative: 8 %
Neutro Abs: 0.2 10*3/uL — CL (ref 1.7–7.7)
Neutrophils Relative %: 11 %
Platelets: 20 10*3/uL — CL (ref 150–400)
RBC: 2.59 MIL/uL — ABNORMAL LOW (ref 4.22–5.81)
RDW: 25.4 % — ABNORMAL HIGH (ref 11.5–15.5)
WBC: 1.9 10*3/uL — ABNORMAL LOW (ref 4.0–10.5)
nRBC: 23.9 % — ABNORMAL HIGH (ref 0.0–0.2)

## 2021-03-29 LAB — COMPREHENSIVE METABOLIC PANEL
ALT: 35 U/L (ref 0–44)
AST: 29 U/L (ref 15–41)
Albumin: 2.5 g/dL — ABNORMAL LOW (ref 3.5–5.0)
Alkaline Phosphatase: 66 U/L (ref 38–126)
Anion gap: 8 (ref 5–15)
BUN: 25 mg/dL — ABNORMAL HIGH (ref 6–20)
CO2: 24 mmol/L (ref 22–32)
Calcium: 8.4 mg/dL — ABNORMAL LOW (ref 8.9–10.3)
Chloride: 101 mmol/L (ref 98–111)
Creatinine, Ser: 0.76 mg/dL (ref 0.61–1.24)
GFR, Estimated: >60 mL/min (ref >60)
Glucose, Bld: 125 mg/dL — ABNORMAL HIGH (ref 70–99)
Potassium: 3.8 mmol/L (ref 3.5–5.1)
Sodium: 133 mmol/L — ABNORMAL LOW (ref 135–145)
Total Bilirubin: 0.7 mg/dL (ref 0.3–1.2)
Total Protein: 6.5 g/dL (ref 6.5–8.1)

## 2021-03-29 LAB — LACTATE DEHYDROGENASE: LDH: 234 U/L — ABNORMAL HIGH (ref 98–192)

## 2021-03-29 LAB — BRAIN NATRIURETIC PEPTIDE: B Natriuretic Peptide: 882 pg/mL — ABNORMAL HIGH (ref 0.0–100.0)

## 2021-03-29 LAB — PROCALCITONIN: Procalcitonin: 0.35 ng/mL

## 2021-03-29 MED ORDER — AZITHROMYCIN 250 MG PO TABS
500.0000 mg | ORAL_TABLET | Freq: Every day | ORAL | Status: DC
  Administered 2021-03-29 – 2021-03-31 (×3): 500 mg via ORAL
  Filled 2021-03-29 (×3): qty 2

## 2021-03-29 MED ORDER — CEFDINIR 300 MG PO CAPS
300.0000 mg | ORAL_CAPSULE | Freq: Two times a day (BID) | ORAL | Status: DC
  Administered 2021-03-29 – 2021-03-31 (×4): 300 mg via ORAL
  Filled 2021-03-29 (×5): qty 1

## 2021-03-29 NOTE — Progress Notes (Signed)
Shift summary:  Remained alert and oriented x 4. Was able to shower during this shift, per MD Ennever free-text order, vitals signs stable during this shift, complained of cough, but when in room no cough noted. Denied having any pain needs. Family visited during this shift, plan of care explained to family. Placed on droplet precautions and this was explained to patient and family. Ambulated to bathroom without assistance. Adequate urinary out put. No other needs identified. Will continue to monitor.

## 2021-03-29 NOTE — Progress Notes (Signed)
Received call from lab regarding critical platelet count and neutrophils, messaged on call provider and notified of results with no new orders at this time just continue to monitor

## 2021-03-29 NOTE — Progress Notes (Signed)
This morning, [name] seems to be feeling a little bit better.  He says his appetite is improving.  He seemed to order a fairly substantial breakfast.  There really is very little change in these lesions on his skin.  Again, I really think a biopsy would be helpful.  Again this might be a vasculitis.  I just would be very surprised if this is all from an allergic reaction to antibiotics.  His cultures have all been negative.  I probably would stop antibiotics on him for right now.  Cardiology is following him.  His BNP is improving.  He did have the echocardiogram done on 03/27/2021.  This showed an left ventricular ejection fraction of 45-50%.  There are very, very rare reports of Vidaza causing a cardiomyopathy.  In all the patients that we have treated with Vidaza, none have ever had a cardiac issue.  When we initially saw him in the office when he presented with the pancytopenia, he did have a cardiac murmur.  I believe this is probably aortic stenosis.  His white cell count is 1.9.  Hemoglobin 7.3.  Platelet count 20,000.  The BNP is 882.  His BUN is 25 creatinine 0.76.  His albumin is 2.5.  He certainly does not appear to be septic.  Again I am still not sure what this rash is.  It certainly is papular in nature.  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  This is where a biopsy would definitely be helpful.  I does not know if that would ever happen over the weekend.  I very much appreciate the great care that he is getting.  All of the staff on [address] are doing a great job with him.  I just wonder if he needs to be on a cardiac monitor.  Everything looks to be relatively stable from what I can tell.   Thomas Haw, MD Thomas Frazier 6:9

## 2021-03-29 NOTE — Progress Notes (Signed)
Subjective:  He feels much better, states that he would like to walk in the hallway.  He would also like to take a shower.  Intake/Output from previous day:  I/O last 3 completed shifts: In: 7588 [P.O.:620; IV Piggyback:600] Out: 1400 [Urine:1400] Total I/O In: -  Out: 500 [Urine:500]  Blood pressure (!) 102/56, pulse 93, temperature 98.6 F (37 C), temperature source Oral, resp. rate (!) 22, height 5' 9"  (1.753 m), weight 72.6 kg, SpO2 93 %. Physical Exam Constitutional:      General: He is not in acute distress.    Appearance: He is well-developed. He is not ill-appearing.  HENT:     Head: Atraumatic.     Mouth/Throat:     Mouth: Mucous membranes are moist.  Eyes:     Extraocular Movements: Extraocular movements intact.  Neck:     Vascular: No carotid bruit or JVD.  Cardiovascular:     Rate and Rhythm: Normal rate and regular rhythm.     Pulses: Normal pulses and intact distal pulses.          Carotid pulses are on the right side with bruit and on the left side with bruit.    Heart sounds: S1 normal and S2 normal. Murmur heard.   Harsh midsystolic murmur is present with a grade of 3/6 at the upper right sternal border radiating to the neck. No gallop.      Comments: No leg edema.  JVD minimally elevated.  Improved from yesterday. Pulmonary:     Effort: Pulmonary effort is normal.     Breath sounds: Wheezing (Occasional scattered, significantly improved from yesterday.) present.  Abdominal:     General: Bowel sounds are normal.     Palpations: Abdomen is soft.  Musculoskeletal:     Cervical back: Normal range of motion.  Skin:    General: Skin is warm.     Findings: Lesion (Skin lesions appear to have slightly improved from yesterday.) present.  Neurological:     General: No focal deficit present.     Mental Status: He is alert and oriented to person, place, and time.     Lab Results: BMP BNP (last 3 results) Recent Labs    03/26/21 1759 03/29/21 0313  BNP  1,238.3* 882.0*    ProBNP (last 3 results) No results for input(s): PROBNP in the last 8760 hours. BMP Latest Ref Rng & Units 03/29/2021 03/28/2021 03/27/2021  Glucose 70 - 99 mg/dL 125(H) 122(H) 172(H)  BUN 6 - 20 mg/dL 25(H) 20 17  Creatinine 0.61 - 1.24 mg/dL 0.76 0.98 0.88  Sodium 135 - 145 mmol/L 133(L) 137 134(L)  Potassium 3.5 - 5.1 mmol/L 3.8 3.6 4.1  Chloride 98 - 111 mmol/L 101 103 103  CO2 22 - 32 mmol/L 24 25 23   Calcium 8.9 - 10.3 mg/dL 8.4(L) 8.8(L) 8.7(L)   Hepatic Function Latest Ref Rng & Units 03/29/2021 03/28/2021 03/27/2021  Total Protein 6.5 - 8.1 g/dL 6.5 7.4 6.8  Albumin 3.5 - 5.0 g/dL 2.5(L) 2.8(L) 2.7(L)  AST 15 - 41 U/L 29 29 22   ALT 0 - 44 U/L 35 36 32  Alk Phosphatase 38 - 126 U/L 66 75 65  Total Bilirubin 0.3 - 1.2 mg/dL 0.7 0.8 1.0  Bilirubin, Direct 0.0 - 0.2 mg/dL - - 0.2   CBC Latest Ref Rng & Units 03/29/2021 03/28/2021 03/27/2021  WBC 4.0 - 10.5 K/uL 1.9(L) 2.0(L) 1.7(L)  Hemoglobin 13.0 - 17.0 g/dL 7.3(L) 8.7(L) 6.7(LL)  Hematocrit 39.0 - 52.0 %  22.6(L) 26.6(L) 21.3(L)  Platelets 150 - 400 K/uL 20(LL) 23(LL) 12(LL)   Lipid Panel  No results found for: CHOL, TRIG, HDL, CHOLHDL, VLDL, LDLCALC, LDLDIRECT Cardiac Panel (last 3 results) No results for input(s): CKTOTAL, CKMB, TROPONINI, RELINDX in the last 72 hours.  HEMOGLOBIN A1C No results found for: HGBA1C, MPG TSH No results for input(s): TSH in the last 8760 hours.   03/26/2021:   The heart size and mediastinal contours are within normal limits. Mild bibasilar subsegmental atelectasis is noted. No pneumothorax or pleural effusion is noted. The visualized skeletal structures are unremarkable.  IMPRESSION: Mild bibasilar subsegmental atelectasis.   CT Angio Chest PE W/Cm &/Or Wo Cm 03/26/2021:  Cardiovascular: There are no filling defects within the pulmonary arteries to suggest pulmonary embolus. Thoracic aorta is normal in caliber. Coronary artery calcifications or stents. Aortic valvular  calcifications. Borderline cardiomegaly. Trace pericardial effusion anteriorly.  Mediastinum/Nodes: Enlarged right hilar lymph nodes measuring up to 16 mm. Prominent left hilar nodes measuring up to 7 mm. Scattered prominent mediastinal nodes, largest subcarinal measuring 14 mm. No esophageal wall thickening. No thyroid nodule. Lungs/Pleura: Subpleural consolidation in the anterior left upper lobe has mild surrounding ground-glass, series 7, image 41. Smaller subpleural consolidation in the anterior right upper lobe, series 7, image 44. Patchy, ground-glass, and confluent opacities in the right greater than left lower lobe. No significant pleural effusion. Trachea and central bronchi are patent. Upper Abdomen: No acute or unexpected findings.  Musculoskeletal: There are no acute or suspicious osseous abnormalities. Review of the MIP images confirms the above findings.   IMPRESSION:  1. No pulmonary embolus.  2. Patchy, ground-glass, and confluent opacities in the right greater than left lower lobe. There also subpleural opacities involving the anterior left greater than right upper lobes. Findings are suspicious for multifocal pneumonia. Recommend radiographic follow-up to resolution.  3. Prominent right greater than left hilar nodes and prominent subcarinal node, likely reactive. 4. Borderline cardiomegaly.  Coronary artery calcifications. Aortic Atherosclerosis  DG CHEST PORT 1 VIEW 03/28/2021 Cardiac enlargement. Progressive vascular congestion and mild interstitial edema. Progressive bibasilar airspace disease. Small bilateral effusions.  IMPRESSION: Congestive heart failure with interval worsening. Progression of bibasilar atelectasis.  Cardiac Studies:   ECHOCARDIOGRAM COMPLETE 03/27/2021:  1. Left ventricular ejection fraction, by estimation, is 45 to 50%. The left ventricle has mildly decreased function. The left ventricle demonstrates global hypokinesis. The left ventricular internal  cavity size was mildly dilated. Left ventricular diastolic parameters are consistent with Grade II diastolic dysfunction (pseudonormalization). Elevated left atrial pressure.   2. Right ventricular systolic function is normal. The right ventricular size is mildly enlarged. There is mildly elevated pulmonary artery systolic pressure. The estimated right ventricular systolic pressure is 57.8 mmHg.   3. Left atrial size was mildly dilated.   4. The mitral valve is grossly normal. Trivial mitral valve regurgitation. No evidence of mitral stenosis.   5. The aortic valve is tricuspid. There is moderate calcification of the aortic valve. There is moderate thickening of the aortic valve. Aortic valve regurgitation is mild to moderate. Moderate aortic valve stenosis. Aortic valve area, by VTI measures 1.28 cm. Aortic valve mean gradient measures 23.3 mmHg. Aortic valve Vmax measures 3.12 m/s.  6. The inferior vena cava is normal in size with greater than 50% respiratory variability, suggesting right atrial pressure of 3 mmHg.  7. Trivial pericardial effusion is present.   I reviewed his echocardiogram, his EF appears to be no more than 30 to 35% with global hypokinesis,  he also has dilated LV.  His aortic stenosis appears to be severe with low output low gradient aortic stenosis with severe restriction and movement of right and left coronary cusp, low gradient low EF severe aortic stenosis.   Scheduled Meds: . carvedilol  3.125 mg Oral BID WC  . digoxin  0.25 mg Oral Daily  . feeding supplement  237 mL Oral BID BM  . furosemide  20 mg Intravenous Q8H  . multivitamin with minerals  1 tablet Oral Daily  . potassium chloride  10 mEq Oral TID  . sacubitril-valsartan  1 tablet Oral BID   Continuous Infusions: PRN Meds:.acetaminophen **OR** acetaminophen, traMADol  EKG: EKG 03/19/2021: Sinus tachycardia with single PVC at a rate of 106 bpm.  Normal axis.  Nonspecific T wave abnormality.  Assessment   Thomas Frazier  is a 55 y.o. 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  Patient denies significant cardiovascular risk factors.  No history of hypertension, hyperlipidemia, diabetes, no history of premature CAD.  No known history of CAD. Patient is currently undergoing treatment with Vidaza per oncology.  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   1.  Acute on chronic systolic heart failure 2.  Severe aortic stenosis 3.  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  Doubt sepsis. 4.  Systemic inflammatory response syndrome.  Recommendations:    Patient has responded remarkably well with IV diuresis with improvement in dyspnea, improvement in physical exam with regard to lung crackles, BNP is also improved.  Would recommend continuing diuresis today and switch to p.o. Lasix tomorrow at 20 mg twice daily for discharge.  He is also tolerating low-dose Entresto, low-dose beta-blocker and digoxin, continue the same for now.  I doubt that chemotherapeutic agent has contributed to his cardiomyopathy.  Suspect severe aortic stenosis to be the etiology and as the left ventricle is dilated, no significant leaking troponin, suspect this is chronic.  I reviewed my findings with Dr. Marin Olp, I also discussed with the patient, advised that Antivert that from hematologic standpoint he can resume which therapy is appropriate in view of aggressive nature of his malignancy and I could continue to follow him closely with regard to cardiac issues.  Once transitioned to oral Lasix tomorrow, if blood pressure is stable and renal function has remained stable, could uptitrate his carvedilol to 6.25 mg twice daily and I will like to see him back in the office in 2 weeks upon discharge for follow-up of congestive heart failure.  I have placed an order for chest x-ray today to see  if there has been any improvement in infiltrates.  He also has BMP and BNP ordered for the next 2 days.   Adrian Prows, MD, [facility] 03/29/2021, 1:10 PM Office: [phone] Pager: [phone]

## 2021-03-29 NOTE — Progress Notes (Signed)
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 Admitted for further evaluation of suspected ecthyma gangrenosum, pancytopenia, rule out sepsis, possible multifocal pneumonia.  Received platelets on PRBC, treated with broad-spectrum antibiotics.  Followed by oncology.  Developed increasing shortness of breath, echocardiogram revealed systolic and diastolic CHF.  Seen by cardiology, treated with diuresis with improvement.  Rash remains of unclear etiology.  A & P  Multifocal pneumonia, concern for eczema gangrenosum, rule out sepsis, complicated by pancytopenia, immunosuppression secondary to chemotherapy.  CTA chest no PE.  Patchy groundglass confluent opacities right greater than left lobe. -- Tachypneic, dyspneic, fatigues easily but no clear worsening and subjectively improved.  Antibiotics stopped by oncology.  Symptoms may be related more to heart failure rather than infectious pneumonia. --Monitor clinically off antibiotics.  Asked for infectious disease input given puzzling case.  Rash.  Eczema gangrenosum considered.  Rash as an outpatient 4/25, instructed by Dr. Marin Olp to stop Famvir, Diflucan and Cipro. -- No ulcerations noted.  Some a little bit bigger but no new lesions.  Very atypical for drug reaction.  Not progressing as would be expected. --Monitor clinically.  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 Marin Olp.  Being treated with Vidaza, status post cycle 1 started 02/17/2021. --Platelets stable after infusion, hemoglobin trending down, may need blood tomorrow.  Leukopenia noted.  Acute combined systolic and diastolic CHF --Echocardiogram showed low normal LVEF  of 45-50% with global hypokinesis.  Moderate aortic stenosis was also noted.  However cardiology feels EF is more like 30-35% and aortic stenosis is severe. -- Subjectively improved today.  Can treatment as per cardiology including diuresis, carvedilol and Entresto. --Can titrate up carvedilol and follow-up in 2 weeks after discharge with Dr. Einar Gip.  Severe aortic stenosis.  Aortic atherosclerosis -- No treatment indicated at this time.  Disposition Plan:  Discussion: Somewhat better today.  Treatments as above.  Status is: Inpatient  Remains inpatient appropriate because:Ongoing diagnostic testing needed not appropriate for outpatient work up, IV treatments appropriate due to intensity of illness or inability to take PO and Inpatient level of care appropriate due to severity of illness   Dispo: The patient is from: Home              Anticipated d/c is to: Home              Patient currently is not medically stable to d/c.   Difficult to place patient No  DVT prophylaxis: SCDs Start: 03/26/21 2227   Code Status: Full Code Level of care: Telemetry Family Communication: None  Murray Hodgkins, MD  [facility] Direct contact: see [email or web address] (further directions at bottom of note if needed) 7PM-7AM contact night coverage as at bottom of note 03/29/2021, 2:06 PM  LOS: 3 days   Significant Hospital Events   . 4/27 admit for pneumonia, pancytopenia, rash   Consults:  . Oncology  . Cardiology  . ID   Procedures:  .   Significant Diagnostic Tests:  . CT chest multifocal pneumonia . Echo LVEF 45-50% global hypokinesis, moderate AS   Micro Data:  . 4/27 BC >   Antimicrobials:  . Cefepime 4/27 >  . Vancomycin 4/27 >  Interval History/Subjective  CC: f/u rash, SOB  Feels better today; feels like  medication added yesterday is helping Can breathe better but still fatigues very easily Some rash areas bigger, more painful, no new areas Poor appetite but tolerating  diet  Objective   Vitals:  Vitals:   03/29/21 0454 03/29/21 0853  BP: (!) 101/59 (!) 102/56  Pulse: 94 93  Resp: (!) 22   Temp: 98.6 F (37 C)   SpO2: 93%     Exam: Constitutional:   . Appears calm and comfortable, a little better today but still ill ENMT:  . grossly normal hearing  . Lips appear normal Respiratory:  . CTA bilaterally, no w/r/r.  . Respiratory effort moderately increased; fatigues easily, can only speak in short sentences, has to rest frequently Cardiovascular:  . RRR, no m/r/g . Telemetry ST . No LE extremity edema   Skin:  . Lesions right thigh and left forearm larger; other lesions right hand, neck, scalp, face w/o sig change Psychiatric:  . Mental status o Mood, affect appropriate   Today's Data   Weights not recorded, will add  I/O does not appear to be accurate  CMP stable  BNP better 882  Hgb down to 7.3  Plts 20  WBC 1.9, ANC 200  Scheduled Meds: . carvedilol  3.125 mg Oral BID WC  . digoxin  0.25 mg Oral Daily  . feeding supplement  237 mL Oral BID BM  . furosemide  20 mg Intravenous Q8H  . multivitamin with minerals  1 tablet Oral Daily  . potassium chloride  10 mEq Oral TID  . sacubitril-valsartan  1 tablet Oral BID   Continuous Infusions:   Principal Problem:   Multifocal pneumonia Active Problems:   Myelodysplasia, high grade (HCC)   Ecthyma gangrenosum   Pancytopenia (HCC)   Elevated brain natriuretic peptide (BNP) level   Demand ischemia (HCC)   Aortic atherosclerosis (HCC)   Malnutrition of moderate degree   Severe aortic stenosis   Acute combined systolic and diastolic congestive heart failure ([place])   LOS: 3 days   How to contact the [facility] Attending or Consulting provider [place] or covering provider during after hours [place], for this patient?  1. Check the care team in [facility] and look for a) attending/consulting [facility] provider listed and b) the [facility] team listed 2. Log into [email or web address] and use [place]  universal password to access. If you do not have the password, please contact the hospital operator. 3. Locate the [facility] provider you are looking for under [facility] and page to a number that you can be directly reached. 4. If you still have difficulty reaching the provider, please page the [facility] (Director on Call) for the Hospitalists listed on amion for assistance.

## 2021-03-29 NOTE — Consult Note (Addendum)
[facility]ious [facility]    Date of Admission:  03/26/2021     Reason for Consult: dyspnea, rash    Referring Provider: goodrich     Abx: 4/27-30 vanc/cefepime  Outpatient cipro, famciclovir, fluconazole since 4/22; currently hold this admission  Other: azacitidine        Assessment: cxr bilateral lower lobes ggo Dyspnea Rash Myelodysplastic syndrome on azacitidine since 02/17/21  55 yo male with complex cytogenetics myelodysplasia recently started on azacitidine on 02/17/21 now cycle 2 of planned 5, complicated by pancytopenia, fatigue worse after starting chemo, recent antimicrobial prophy regimen started 4/22 admitted 4/27 with dyspnea/cxr bilateral ggo, rash, and sepsis   Blood cultures so far negative. No sputum culture bnp 1200 and tte with 45% ef (although cardiology team doesn't think it is better than 35% ef); diuresis started HD #2 with improvement in bnp to 900. Cardiology assessment is acute heart failure However still low grade fever as of yesterday bs empiric abx started on admission but stopped today 4/30  Noted oncology team mention of azacitidine as potential agent that could cause cardiomyopathy. Cardiology hasn't mentioned left heart cath at this time outside of chf acute management  Noted patient has had prolonged neutropenia <500. However cxr/chest ct inconsistent with pseudomonal pna. Also while the chart and opionions noted the rash as ecthyma, the blood cultures haven't been positive for gram negative BSI. I do not see any central eschar suggestive of ecthyma. The rash is tender and my ddx would be sweet syndrome and less likely LCV  ddx atypical pna vs chf vs drug reaction with pneumonitis. Of note, he just started chemo about 6 weeks ago and typically regular bacterial process still predominant although with his longer standing MDS, could consider some risk for atypical process   Plan: 1. Would get sputum culture 2. Trend  procalcitonin 3. Check fungitel; ldh is mildly elevated, at risk for pjp although this is a little early into the chemotherapy  4. Check legionella urine ag 5. Given normal o2 saturation and stable hemodynamics, agree many confounding factors but untreated pneumonia is rather high mortality in hematologic patient would treat as cap with cefdinir/azithromycin --> plan 3-5 days depending on his clinical improvement 6. Agree a skin biopsy would be helpful. Could discuss with general surgery about a punch biopsy at the edge of one of the rashes and send for pathology, fungal/afb/bacterial culture     ------------------------------------------------ Principal Problem:   Multifocal pneumonia Active Problems:   Myelodysplasia, high grade (HCC)   Ecthyma gangrenosum   Pancytopenia (HCC)   Elevated brain natriuretic peptide (BNP) level   Demand ischemia (HCC)   Aortic atherosclerosis (HCC)   Malnutrition of moderate degree   Severe aortic stenosis   Acute combined systolic and diastolic congestive heart failure (HCC)    HPI: Thomas Frazier is a 55 y.o. male recently dx'ed mds started on azacitidine 01/2020 (last chemo cycle 1 month prior to admission), pancytopenia, recently placed on fluconazole/famciclovir/ciproflox on 1/01 complicated by acute (within the first day of taking the antimicrobials) diffuse rash along with intermittent fever and the last 2 days prior to admission sob, admitted 4/27 for sepsis/rash/pna   Patient has no prior drug allergy His last cycle of azacitidine was a month prior to admission He saw his oncologist on 4/22 and reported fatigue/malaise. In the setting neutropenia that has been long, his oncologist placed him on prophy antimicrobials  A day within starting his antimicrobials he developed  the rash which is red/lump-bump and diffuse. It is not itchy. It is tender to the touch  He stopped abx prophy a day prior to admission  He reports associated subjective  f/c No joint pain, headache, numbness/tingling, n/v/diarrhea  Minimal cough 2-3 days before admission endorsed sob. No leg swelling. No modifying factors for sob  On admission has had fever. cxr showed bilateral lower lobes opacity. Echo showed ef 45 but cardiology following and think it is in the 35%. Neutrophil count has been 200s. He was given vanc/cefepime. bcx negative. No sputum cx  He said his sob/cough is improving  The rash remains rather moderate/severe. No mucosa involvement  Id epi: No sick contact No recent travel No pets/animal exposure Lives at home with wife Works in yard/with trees/plants but none for the past year No other exotic hobby  Hasn't been requiring oxygen since admission. Fever curve improving with empiric vanc/cefepime which is stopped on hd#3      Family History  Family history unknown: Yes    Social History   Tobacco Use  . Smoking status: Never Smoker  . Smokeless tobacco: Never Used  Vaping Use  . Vaping Use: Never used  Substance Use Topics  . Alcohol use: Not Currently  . Drug use: Never    Allergies  Allergen Reactions  . Ciprofloxacin Rash  . Diflucan [Fluconazole] Rash  . Famvir [Famciclovir] Rash    Review of Systems: ROS All Other ROS was negative, except mentioned above   Past Medical History:  Diagnosis Date  . Acute diastolic CHF (congestive heart failure) ([place]) 03/28/2021  . Aortic atherosclerosis ([place]) 03/27/2021  . 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 carvedilol  3.125 mg Oral BID WC  . digoxin  0.25 mg Oral Daily  . feeding supplement  237 mL Oral BID BM  . furosemide  20 mg Intravenous Q8H  . multivitamin with minerals  1 tablet Oral Daily  . potassium chloride  10 mEq Oral TID  . sacubitril-valsartan  1 tablet Oral BID   Continuous Infusions: PRN Meds:.acetaminophen **OR** acetaminophen, traMADol   OBJECTIVE: Blood pressure (!) 102/53, pulse 91,  temperature 98.7 F (37.1 C), temperature source Oral, resp. rate (!) 24, height 5\' 9"  (1.753 m), weight 72.6 kg, SpO2 96 %.  Physical Exam Well appearing male no distress, pleasant, conversant. Playing chess with his son at bedside Heent: normocephalic; per/conj clear; oropharynx clear Neck supple cv rrr no mrg Lungs clear; normal respiratory effort abd s/nt Ext no edema msk no synovitis Neuro nonfocal; strength 5/5 symmetric Psych alert/oriented Skin diffuse maculopapular rash some with desquamation and several large erythematous nodule. No central eschar   Lab Results Lab Results  Component Value Date   WBC 1.9 (L) 03/29/2021   HGB 7.3 (L) 03/29/2021   HCT 22.6 (L) 03/29/2021   MCV 87.3 03/29/2021   PLT 20 (LL) 03/29/2021    Lab Results  Component Value Date   CREATININE 0.76 03/29/2021   BUN 25 (H) 03/29/2021   NA 133 (L) 03/29/2021   K 3.8 03/29/2021   CL 101 03/29/2021   CO2 24 03/29/2021    Lab Results  Component Value Date   ALT 35 03/29/2021   AST 29 03/29/2021   ALKPHOS 66 03/29/2021   BILITOT 0.7 03/29/2021      Microbiology: Recent Results (from the past 240 hour(s))  Culture, blood (routine x 2)     Status: None (Preliminary result)  Collection Time: 03/26/21  3:15 PM   Specimen: BLOOD  Result Value Ref Range Status   Specimen Description   Final    BLOOD BLOOD RIGHT FOREARM Performed at [facility], [address] [place], [place] [address]    Special Requests   Final    BOTTLES DRAWN AEROBIC AND ANAEROBIC Blood Culture adequate volume Performed at [facility], [address] [place], [place] [address]    Culture   Final    NO GROWTH 3 DAYS Performed at [facility], [place] [address] [place], [place] [address]    Report Status PENDING  Incomplete  Resp Panel by RT-PCR (Flu A&B, Covid) Peripheral     Status: None   Collection Time: 03/26/21  3:20 PM   Specimen: Peripheral; Nasopharyngeal(NP) swabs in vial  transport medium  Result Value Ref Range Status   SARS Coronavirus 2 by RT PCR NEGATIVE NEGATIVE Final    Comment: (NOTE) SARS-CoV-2 target nucleic acids are NOT DETECTED.  The SARS-CoV-2 RNA is generally detectable in upper respiratory specimens during the acute phase of infection. The lowest concentration of SARS-CoV-2 viral copies this assay can detect is 138 copies/mL. A negative result does not preclude SARS-Cov-2 infection and should not be used as the sole basis for treatment or other patient management decisions. A negative result may occur with  improper specimen collection/handling, submission of specimen other than nasopharyngeal swab, presence of viral mutation(s) within the areas targeted by this assay, and inadequate number of viral copies(<138 copies/mL). A negative result must be combined with clinical observations, patient history, and epidemiological information. The expected result is Negative.  Fact Sheet for Patients:  EntrepreneurPulse.com.au  Fact Sheet for Healthcare Providers:  IncredibleEmployment.be  This test is no t yet approved or cleared by the [place] FDA and  has been authorized for detection and/or diagnosis of SARS-CoV-2 by FDA under an Emergency Use Authorization (EUA). This EUA will remain  in effect (meaning this test can be used) for the duration of the COVID-19 declaration under Section 564(b)(1) of the Act, 21 U.S.C.section 360bbb-3(b)(1), unless the authorization is terminated  or revoked sooner.       Influenza A by PCR NEGATIVE NEGATIVE Final   Influenza B by PCR NEGATIVE NEGATIVE Final    Comment: (NOTE) The Xpert Xpress SARS-CoV-2/FLU/RSV plus assay is intended as an aid in the diagnosis of influenza from Nasopharyngeal swab specimens and should not be used as a sole basis for treatment. Nasal washings and aspirates are unacceptable for Xpert Xpress SARS-CoV-2/FLU/RSV testing.  Fact  Sheet for Patients: EntrepreneurPulse.com.au  Fact Sheet for Healthcare Providers: IncredibleEmployment.be  This test is not yet approved or cleared by the [place] FDA and has been authorized for detection and/or diagnosis of SARS-CoV-2 by FDA under an Emergency Use Authorization (EUA). This EUA will remain in effect (meaning this test can be used) for the duration of the COVID-19 declaration under Section 564(b)(1) of the Act, 21 U.S.C. section 360bbb-3(b)(1), unless the authorization is terminated or revoked.  Performed at [facility], [address] [place], [place] [address]   Culture, blood (routine x 2)     Status: None (Preliminary result)   Collection Time: 03/26/21  3:24 PM   Specimen: BLOOD  Result Value Ref Range Status   Specimen Description   Final    BLOOD LEFT ANTECUBITAL Performed at [facility], [address] [place], [place] [address]  Special Requests   Final    BOTTLES DRAWN AEROBIC AND ANAEROBIC Blood Culture adequate volume Performed at [facility], [address] [place], [place] [address]    Culture   Final    NO GROWTH 3 DAYS Performed at [facility], [place] [address], , [place] [address]    Report Status PENDING  Incomplete  Culture, blood (Routine X 2) w Reflex to ID Panel     Status: None (Preliminary result)   Collection Time: 03/28/21 11:46 AM   Specimen: BLOOD  Result Value Ref Range Status   Specimen Description   Final    BLOOD BLOOD LEFT HAND Performed at [facility], [place] [address], [place], [place] [address]    Special Requests   Final    BOTTLES DRAWN AEROBIC AND ANAEROBIC Blood Culture adequate volume Performed at [place] [address] [place], [place] [address]    Culture   Final    NO GROWTH < 24 HOURS Performed at [place] [address] [place], [place] [address]    Report Status PENDING   Incomplete  Culture, blood (Routine X 2) w Reflex to ID Panel     Status: None (Preliminary result)   Collection Time: 03/28/21 11:46 AM   Specimen: BLOOD  Result Value Ref Range Status   Specimen Description   Final    BLOOD BLOOD RIGHT HAND Performed at [facility] [address], [place], [place] [address]    Special Requests   Final    BOTTLES DRAWN AEROBIC ONLY Blood Culture results may not be optimal due to an inadequate volume of blood received in culture bottles Performed at [facility] [address] [place], [place] [address]    Culture   Final    NO GROWTH < 24 HOURS Performed at [facility] [address], [place],  [address]    Report Status PENDING  Incomplete  MRSA PCR Screening     Status: None   Collection Time: 03/28/21 12:18 PM   Specimen: Nasal Mucosa; Nasopharyngeal  Result Value Ref Range Status   MRSA by PCR NEGATIVE NEGATIVE Final    Comment:        The GeneXpert MRSA Assay (FDA approved for NASAL specimens only), is one component of a comprehensive MRSA colonization surveillance program. It is not intended to diagnose MRSA infection nor to guide or monitor treatment for MRSA infections. Performed at [facility], [place] [address] [place],  [address]      Serology:    Imaging: If present, new imagings (plain films, ct scans, and mri) have been personally visualized and interpreted; radiology reports have been reviewed. Decision making incorporated into the Impression / Recommendations.  4/28 tte 1. Left ventricular ejection fraction, by estimation, is 45 to 50%. The  left ventricle has mildly decreased function. The left ventricle  demonstrates global hypokinesis. The left ventricular internal cavity size  was mildly dilated. Left ventricular  diastolic parameters are consistent with Grade II diastolic dysfunction  (pseudonormalization). Elevated left atrial  pressure.  2. Right ventricular systolic function is normal. The right ventricular  size is mildly enlarged. There is mildly elevated pulmonary artery  systolic pressure. The estimated right ventricular systolic pressure is  [id number] mmHg.  3. Left atrial size was mildly dilated.  4. The mitral valve is grossly normal. Trivial mitral valve  regurgitation. No evidence of mitral stenosis.  5. The aortic valve is tricuspid. There is moderate calcification of the  aortic valve. There is moderate thickening of the aortic valve. Aortic  valve regurgitation is mild to moderate. Moderate aortic valve stenosis.  Aortic valve area, by VTI measures  1.28 cm. Aortic valve mean gradient measures 23.3 mmHg. Aortic valve Vmax  measures 3.12 m/s.  6. The inferior vena cava is normal in size with greater than 50%  respiratory variability, suggesting right atrial pressure of 3 mmHg.   4/27 chest cta 1. No pulmonary embolus. 2. Patchy, ground-glass, and confluent opacities in the right greater than left lower lobe. There also subpleural opacities involving the anterior left greater than right upper lobes. Findings are suspicious for multifocal pneumonia. Recommend radiographic follow-up to resolution. 3. Prominent right greater than left hilar nodes and prominent subcarinal node, likely reactive. 4. Borderline cardiomegaly.  Coronary artery calcifications  Jabier Mutton, MD [facility] for Infectious [place] [phone] pager    03/29/2021, 3:09 PM

## 2021-03-30 DIAGNOSIS — R21 Rash and other nonspecific skin eruption: Secondary | ICD-10-CM

## 2021-03-30 LAB — COMPREHENSIVE METABOLIC PANEL
ALT: 54 U/L — ABNORMAL HIGH (ref 0–44)
AST: 47 U/L — ABNORMAL HIGH (ref 15–41)
Albumin: 2.4 g/dL — ABNORMAL LOW (ref 3.5–5.0)
Alkaline Phosphatase: 76 U/L (ref 38–126)
Anion gap: 7 (ref 5–15)
BUN: 24 mg/dL — ABNORMAL HIGH (ref 6–20)
CO2: 25 mmol/L (ref 22–32)
Calcium: 8.7 mg/dL — ABNORMAL LOW (ref 8.9–10.3)
Chloride: 101 mmol/L (ref 98–111)
Creatinine, Ser: 0.71 mg/dL (ref 0.61–1.24)
GFR, Estimated: >60 mL/min (ref >60)
Glucose, Bld: 114 mg/dL — ABNORMAL HIGH (ref 70–99)
Potassium: 3.7 mmol/L (ref 3.5–5.1)
Sodium: 133 mmol/L — ABNORMAL LOW (ref 135–145)
Total Bilirubin: 0.6 mg/dL (ref 0.3–1.2)
Total Protein: 6.8 g/dL (ref 6.5–8.1)

## 2021-03-30 LAB — CBC WITH DIFFERENTIAL/PLATELET
Abs Immature Granulocytes: 0.01 10*3/uL (ref 0.00–0.07)
Basophils Absolute: 0 10*3/uL (ref 0.0–0.1)
Basophils Relative: 1 %
Eosinophils Absolute: 0 10*3/uL (ref 0.0–0.5)
Eosinophils Relative: 1 %
HCT: 23.5 % — ABNORMAL LOW (ref 39.0–52.0)
Hemoglobin: 7.6 g/dL — ABNORMAL LOW (ref 13.0–17.0)
Immature Granulocytes: 1 %
Lymphocytes Relative: 84 %
Lymphs Abs: 1.9 10*3/uL (ref 0.7–4.0)
MCH: 28.1 pg (ref 26.0–34.0)
MCHC: 32.3 g/dL (ref 30.0–36.0)
MCV: 87 fL (ref 80.0–100.0)
Monocytes Absolute: 0.1 10*3/uL (ref 0.1–1.0)
Monocytes Relative: 5 %
Neutro Abs: 0.2 10*3/uL — CL (ref 1.7–7.7)
Neutrophils Relative %: 8 %
Platelets: 21 10*3/uL — CL (ref 150–400)
RBC: 2.7 MIL/uL — ABNORMAL LOW (ref 4.22–5.81)
RDW: 25.1 % — ABNORMAL HIGH (ref 11.5–15.5)
WBC: 2 10*3/uL — ABNORMAL LOW (ref 4.0–10.5)
nRBC: 21.7 % — ABNORMAL HIGH (ref 0.0–0.2)

## 2021-03-30 LAB — RESPIRATORY PANEL BY PCR

## 2021-03-30 LAB — LACTATE DEHYDROGENASE: LDH: 218 U/L — ABNORMAL HIGH (ref 98–192)

## 2021-03-30 LAB — BRAIN NATRIURETIC PEPTIDE: B Natriuretic Peptide: 644.7 pg/mL — ABNORMAL HIGH (ref 0.0–100.0)

## 2021-03-30 NOTE — Plan of Care (Signed)
  Problem: Clinical Measurements: Goal: Will remain free from infection Outcome: Progressing Goal: Respiratory complications will improve Outcome: Progressing   Problem: Nutrition: Goal: Adequate nutrition will be maintained Outcome: Progressing   Problem: Education: Goal: Knowledge of General Education information will improve Description: Including pain rating scale, medication(s)/side effects and non-pharmacologic comfort measures Outcome: Not Progressing

## 2021-03-30 NOTE — Progress Notes (Addendum)
PROGRESS NOTE  Thomas Frazier EYC:[id number] DOB: [birth year] DOA: 03/26/2021 PCP: Cathleen Corti, PA-C  Brief History    55 year old man PMH high-grade myelodysplasia on chemotherapy, pancytopenia, noticed rash on body approximately 48 hours prior, sent by oncologist to the emergency department. Admitted for further evaluation of suspected ecthyma gangrenosum, pancytopenia, rule out sepsis, possible multifocal pneumonia.  Received platelets and PRBC, treated with broad-spectrum antibiotics.  Followed by oncology.  Developed increasing shortness of breath, echocardiogram revealed systolic and diastolic CHF.  Seen by cardiology, treated with diuresis with improvement.  Rash remains of unclear etiology.  Will ask for biopsy 5/2.  Seen by infectious disease.  Appears better today.  Likely home next 48 hours if continues to improve.  A & P  Multifocal pneumonia versus CHF, concern for eczema gangrenosum, complicated by pancytopenia, immunosuppression secondary to chemotherapy.  Sepsis ruled out.  CTA chest no PE.  Patchy groundglass confluent opacities right greater than left lobe. -- Clinically better today with decreased shortness of breath and dyspnea.  Oral antibiotics as per ID.  Treatment for CHF as per cardiology.  Rash.  Eczema gangrenosum considered, however does not appear to be the case at this point.  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  Developed rash as an outpatient 4/25, instructed by Dr. Marin Olp to stop Famvir, Diflucan and Cipro. -- No significant changes or progression.  We will ask for biopsy from general surgery tomorrow.  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 Marin Olp.  Being treated with Vidaza, status post cycle 1 started 02/17/2021. -- Platelets, hemoglobin, WBC stable.  Acute combined systolic and diastolic CHF --Echocardiogram showed low normal LVEF of 45-50% with global hypokinesis.  Moderate aortic  stenosis was also noted.  However cardiology feels EF is more like 30-35% and aortic stenosis is severe. -- Much improved today.  Can treatment as per cardiology including diuresis, carvedilol and Entresto however the patient is adamant that he does not want to take any heart medications and he does not feel he has any significant heart problems. -- If patient will allow can titrate up carvedilol and follow-up in 2 weeks after discharge with Dr. Einar Gip.  However patient said today he does not want to follow-up with cardiology.  Severe aortic stenosis. --Strongly recommend outpatient follow-up with cardiology  Aortic atherosclerosis -- No treatment indicated at this time.  Disposition Plan:  Discussion: Somewhat better today.  Treatments as above.  Status is: Inpatient  Remains inpatient appropriate because:Ongoing diagnostic testing needed not appropriate for outpatient work up, IV treatments appropriate due to intensity of illness or inability to take PO and Inpatient level of care appropriate due to severity of illness   Dispo: The patient is from: Home              Anticipated d/c is to: Home              Patient currently is not medically stable to d/c.   Difficult to place patient No  DVT prophylaxis: SCDs Start: 03/26/21 2227   Code Status: Full Code Level of care: Telemetry Family Communication: None  Murray Hodgkins, MD  [facility] Direct contact: see [email or web address] (further directions at bottom of note if needed) 7PM-7AM contact night coverage as at bottom of note 03/30/2021, 1:29 PM  LOS: 4 days   Significant Hospital Events   . 4/27 admit for pneumonia, pancytopenia, rash   Consults:  . Oncology  . Cardiology  . ID   Procedures:  . Transfusion  PRBC and plts  Significant Diagnostic Tests:  . CT chest multifocal pneumonia . Echo LVEF 45-50% global hypokinesis, moderate AS   Micro Data:  . 4/27 BC > NGTD . [facility] 4/29 NGTD . Resp panel negative    Antimicrobials:  . Cefepime 4/27 > 4/29 . Vancomycin 4/27 > 4/29 . Azithromycin 4/30 > . Cefdinir 4/30 >   Interval History/Subjective  CC: f/u rash, SOB  Feels much better today.  Breathing better.  Able to talk more easily.  Refusing to take heart medications.  "I do not want to take any heart medications, I do not have any problems with my heart". No new rash or changes of note.  Objective   Vitals:  Vitals:   03/30/21 0845 03/30/21 0906  BP:  (!) 107/56  Pulse: 94 94  Resp:    Temp:    SpO2:      Exam: Constitutional:   . Appears calm and comfortable, better today ENMT:  . grossly normal hearing  . Lips appear normal Respiratory:  . CTA bilaterally, no w/r/r.  . Respiratory effort mildly increased but can speak in full sentences and fatigues less quickly Cardiovascular:  . RRR, no m/r/g . No LE extremity edema   Skin:  . No sig change in rash noted arm/hand, leg, face, scalp, back Psychiatric:  . Mental status o Mood, affect appropriate   Today's Data   UOP 2300  I/O balanced but probably not accurate  CMP w/ AST 47  BNO down to 644  procalcitonin 0.35  Hgb stable 7.6  Plts stable 21  WBC stable 2.0   Scheduled Meds: . azithromycin  500 mg Oral Daily  . carvedilol  3.125 mg Oral BID WC  . cefdinir  300 mg Oral Q12H  . digoxin  0.25 mg Oral Daily  . feeding supplement  237 mL Oral BID BM  . multivitamin with minerals  1 tablet Oral Daily  . potassium chloride  10 mEq Oral TID  . sacubitril-valsartan  1 tablet Oral BID   Continuous Infusions:   Principal Problem:   Multifocal pneumonia Active Problems:   Myelodysplasia, high grade (HCC)   Ecthyma gangrenosum   Pancytopenia (HCC)   Elevated brain natriuretic peptide (BNP) level   Demand ischemia (HCC)   Aortic atherosclerosis (HCC)   Malnutrition of moderate degree   Severe aortic stenosis   Acute combined systolic and diastolic congestive heart failure (HCC)   Acute congestive  heart failure (HCC)   Rash and nonspecific skin eruption   Immunocompromised ([place])   LOS: 4 days   How to contact the [facility] Attending or Consulting provider [place] or covering provider during after hours [place], for this patient?  1. Check the care team in [facility] and look for a) attending/consulting TR[facility] provider listed and b) the [facility] team listed 2. Log into [email or web address] and use [place]'s universal password to access. If you do not have the password, please contact the hospital operator. 3. Locate the [facility] provider you are looking for under [facility] and page to a number that you can be directly reached. 4. If you still have difficulty reaching the provider, please page the [facility] (Director on Call) for the Hospitalists listed on amion for assistance.

## 2021-03-30 NOTE — Progress Notes (Signed)
[name] is about the same this morning.  He developed with a temperature yesterday.  He now is on Zithromax and Omnicef.  There really is no change in these skin lesions that he has.  His labs show white cell count of 2.  Hemoglobin 7.6.  Platelet count 21,000.  His BUN is 24 creatinine 0.71.  Part of the problem that we have his has significant aortic stenosis.  I think this is probably a big contributor to the shortness of breath.  I just do not think this can be fixed given his current blood counts.  I will going to see about getting out to [facility] once he is discharged.  I really think this is a situation that is going to require a stem cell transplant.  Given that he has the significant aortic stenosis, I think he would benefit from a more intensive protocol which we just do not do in the community.  Again, it would be nice to see about a biopsy of 1 of these skin lesions.  His appetite is improving.  He has had no problems with nausea or vomiting.  He has had no diarrhea.  He is out of bed a little bit.  His vital signs are temperature 99.3.  Pulse 85.  Blood pressure 90/61.  His lungs are clear bilaterally.  Oral exam shows no mucositis.  Cardiac exam is regular rate and rhythm.  He has a 3/6 systolic murmur.  Abdomen is soft.  He has good bowel sounds.  There is no fluid wave.  There is no palpable liver or spleen tip.  Extremities shows no clubbing, cyanosis or edema.  Skin exam shows he is maculopapular type erythematous lesions on his skin.  They are about the same as when he was admitted.  Neurological exam is nonfocal.  [name] has a significant problem with respect to myelodysplasia.  He has high risk myelodysplasia.  Given his age, he would be a stem cell transplant candidate.  He is quite immunosuppressed.  All cultures are negative so far.  He has aortic stenosis.  This is going to have to be repaired at some point.  He does not need to be transfused today.  I  appreciate the outstanding care he is getting from all the staff on [address].  Lattie Haw, MD  Psalm 147:3

## 2021-03-31 ENCOUNTER — Telehealth: Payer: Self-pay

## 2021-03-31 ENCOUNTER — Inpatient Hospital Stay: Payer: Self-pay

## 2021-03-31 ENCOUNTER — Inpatient Hospital Stay: Payer: Self-pay | Admitting: Hematology & Oncology

## 2021-03-31 DIAGNOSIS — L270 Generalized skin eruption due to drugs and medicaments taken internally: Secondary | ICD-10-CM

## 2021-03-31 DIAGNOSIS — M310 Hypersensitivity angiitis: Secondary | ICD-10-CM

## 2021-03-31 LAB — CBC WITH DIFFERENTIAL/PLATELET
Abs Immature Granulocytes: 0 10*3/uL (ref 0.00–0.07)
Basophils Absolute: 0 10*3/uL (ref 0.0–0.1)
Basophils Relative: 0 %
Eosinophils Absolute: 0 10*3/uL (ref 0.0–0.5)
Eosinophils Relative: 0 %
HCT: 23.1 % — ABNORMAL LOW (ref 39.0–52.0)
Hemoglobin: 7.4 g/dL — ABNORMAL LOW (ref 13.0–17.0)
Immature Granulocytes: 0 %
Lymphocytes Relative: 88 %
Lymphs Abs: 1.8 10*3/uL (ref 0.7–4.0)
MCH: 27.8 pg (ref 26.0–34.0)
MCHC: 32 g/dL (ref 30.0–36.0)
MCV: 86.8 fL (ref 80.0–100.0)
Monocytes Absolute: 0.1 10*3/uL (ref 0.1–1.0)
Monocytes Relative: 6 %
Neutro Abs: 0.1 10*3/uL — CL (ref 1.7–7.7)
Neutrophils Relative %: 6 %
Platelets: 26 10*3/uL — CL (ref 150–400)
RBC: 2.66 MIL/uL — ABNORMAL LOW (ref 4.22–5.81)
RDW: 24.9 % — ABNORMAL HIGH (ref 11.5–15.5)
WBC: 2.1 10*3/uL — ABNORMAL LOW (ref 4.0–10.5)
nRBC: 24.5 % — ABNORMAL HIGH (ref 0.0–0.2)

## 2021-03-31 LAB — COMPREHENSIVE METABOLIC PANEL
ALT: 63 U/L — ABNORMAL HIGH (ref 0–44)
AST: 49 U/L — ABNORMAL HIGH (ref 15–41)
Albumin: 2.5 g/dL — ABNORMAL LOW (ref 3.5–5.0)
Alkaline Phosphatase: 80 U/L (ref 38–126)
Anion gap: 7 (ref 5–15)
BUN: 23 mg/dL — ABNORMAL HIGH (ref 6–20)
CO2: 26 mmol/L (ref 22–32)
Calcium: 9.1 mg/dL (ref 8.9–10.3)
Chloride: 102 mmol/L (ref 98–111)
Creatinine, Ser: 0.73 mg/dL (ref 0.61–1.24)
GFR, Estimated: >60 mL/min (ref >60)
Glucose, Bld: 110 mg/dL — ABNORMAL HIGH (ref 70–99)
Potassium: 4 mmol/L (ref 3.5–5.1)
Sodium: 135 mmol/L (ref 135–145)
Total Bilirubin: 0.7 mg/dL (ref 0.3–1.2)
Total Protein: 7.2 g/dL (ref 6.5–8.1)

## 2021-03-31 LAB — BRAIN NATRIURETIC PEPTIDE: B Natriuretic Peptide: 513.9 pg/mL — ABNORMAL HIGH (ref 0.0–100.0)

## 2021-03-31 LAB — LACTATE DEHYDROGENASE: LDH: 213 U/L — ABNORMAL HIGH (ref 98–192)

## 2021-03-31 LAB — CULTURE, BLOOD (ROUTINE X 2)
Culture: NO GROWTH
Culture: NO GROWTH
Special Requests: ADEQUATE
Special Requests: ADEQUATE

## 2021-03-31 MED ORDER — DIGOXIN 250 MCG PO TABS: 0.2500 mg | ORAL_TABLET | Freq: Every day | ORAL | 1 refills | Status: DC

## 2021-03-31 MED ORDER — SACUBITRIL-VALSARTAN 24-26 MG PO TABS: 1.0000 | ORAL_TABLET | Freq: Two times a day (BID) | ORAL | 1 refills | Status: DC

## 2021-03-31 MED ORDER — CARVEDILOL 3.125 MG PO TABS: 3.1250 mg | ORAL_TABLET | Freq: Two times a day (BID) | ORAL | 1 refills | Status: DC

## 2021-03-31 NOTE — Plan of Care (Signed)
  Problem: Education: Goal: Knowledge of General Education information will improve Description: Including pain rating scale, medication(s)/side effects and non-pharmacologic comfort measures 03/31/2021 1655 by Lennie Hummer, RN Outcome: Adequate for Discharge 03/31/2021 1249 by Lennie Hummer, RN Outcome: Progressing   Problem: Health Behavior/Discharge Planning: Goal: Ability to manage health-related needs will improve Outcome: Adequate for Discharge   Problem: Clinical Measurements: Goal: Ability to maintain clinical measurements within normal limits will improve Outcome: Adequate for Discharge Goal: Will remain free from infection 03/31/2021 1655 by Lennie Hummer, RN Outcome: Adequate for Discharge 03/31/2021 1249 by Lennie Hummer, RN Outcome: Progressing Goal: Diagnostic test results will improve Outcome: Adequate for Discharge Goal: Respiratory complications will improve Outcome: Adequate for Discharge Goal: Cardiovascular complication will be avoided Outcome: Adequate for Discharge   Problem: Activity: Goal: Risk for activity intolerance will decrease Outcome: Adequate for Discharge   Problem: Coping: Goal: Level of anxiety will decrease 03/31/2021 1655 by Lennie Hummer, RN Outcome: Adequate for Discharge 03/31/2021 1249 by Lennie Hummer, RN Outcome: Progressing   Problem: Elimination: Goal: Will not experience complications related to bowel motility Outcome: Adequate for Discharge Goal: Will not experience complications related to urinary retention Outcome: Adequate for Discharge   Problem: Pain Managment: Goal: General experience of comfort will improve Outcome: Adequate for Discharge   Problem: Safety: Goal: Ability to remain free from injury will improve Outcome: Adequate for Discharge   Problem: Skin Integrity: Goal: Risk for impaired skin integrity will decrease Outcome: Adequate for Discharge   Problem: Education: Goal: Ability to demonstrate  management of disease process will improve 03/31/2021 1655 by Lennie Hummer, RN Outcome: Adequate for Discharge 03/31/2021 1251 by Lennie Hummer, RN Outcome: Progressing 03/31/2021 1249 by Lennie Hummer, RN Outcome: Progressing Goal: Ability to verbalize understanding of medication therapies will improve 03/31/2021 1655 by Lennie Hummer, RN Outcome: Adequate for Discharge 03/31/2021 1251 by Lennie Hummer, RN Outcome: Progressing 03/31/2021 1249 by Lennie Hummer, RN Outcome: Progressing Goal: Individualized Educational Video(s) 03/31/2021 1655 by Lennie Hummer, RN Outcome: Adequate for Discharge 03/31/2021 1249 by Lennie Hummer, RN Outcome: Progressing   Problem: Activity: Goal: Capacity to carry out activities will improve 03/31/2021 1655 by Lennie Hummer, RN Outcome: Adequate for Discharge 03/31/2021 1251 by Lennie Hummer, RN Outcome: Progressing 03/31/2021 1249 by Lennie Hummer, RN Outcome: Progressing   Problem: Cardiac: Goal: Ability to achieve and maintain adequate cardiopulmonary perfusion will improve 03/31/2021 1655 by Lennie Hummer, RN Outcome: Adequate for Discharge 03/31/2021 1251 by Lennie Hummer, RN Outcome: Progressing 03/31/2021 1249 by Lennie Hummer, RN Outcome: Progressing

## 2021-03-31 NOTE — Telephone Encounter (Signed)
appts r/s from this week to next per sch message and pt is in the hosp   Thomas Frazier

## 2021-03-31 NOTE — Plan of Care (Signed)
  Problem: Health Behavior/Discharge Planning: Goal: Ability to manage health-related needs will improve Outcome: Progressing   Problem: Activity: Goal: Risk for activity intolerance will decrease Outcome: Progressing   Problem: Elimination: Goal: Will not experience complications related to bowel motility Outcome: Progressing   Problem: Safety: Goal: Ability to remain free from injury will improve Outcome: Progressing   Problem: Skin Integrity: Goal: Risk for impaired skin integrity will decrease Outcome: Progressing

## 2021-03-31 NOTE — Plan of Care (Signed)
  Problem: Education: Goal: Knowledge of General Education information will improve Description: Including pain rating scale, medication(s)/side effects and non-pharmacologic comfort measures Outcome: Progressing   Problem: Clinical Measurements: Goal: Will remain free from infection Outcome: Progressing   Problem: Coping: Goal: Level of anxiety will decrease Outcome: Progressing   Problem: Education: Goal: Ability to demonstrate management of disease process will improve 03/31/2021 1251 by Lennie Hummer, RN Outcome: Progressing 03/31/2021 1249 by Lennie Hummer, RN Outcome: Progressing Goal: Ability to verbalize understanding of medication therapies will improve 03/31/2021 1251 by Lennie Hummer, RN Outcome: Progressing 03/31/2021 1249 by Lennie Hummer, RN Outcome: Progressing Goal: Individualized Educational Video(s) Outcome: Progressing   Problem: Activity: Goal: Capacity to carry out activities will improve 03/31/2021 1251 by Lennie Hummer, RN Outcome: Progressing 03/31/2021 1249 by Lennie Hummer, RN Outcome: Progressing   Problem: Cardiac: Goal: Ability to achieve and maintain adequate cardiopulmonary perfusion will improve 03/31/2021 1251 by Lennie Hummer, RN Outcome: Progressing 03/31/2021 1249 by Lennie Hummer, RN Outcome: Progressing

## 2021-03-31 NOTE — Progress Notes (Addendum)
Subjective:  As he feels better and the rash is better   Antibiotics:  Anti-infectives (From admission, onward)   Start     Dose/Rate Route Frequency Ordered Stop   03/29/21 2200  cefdinir (OMNICEF) capsule 300 mg        300 mg Oral Every 12 hours 03/29/21 1533     03/29/21 1700  azithromycin (ZITHROMAX) tablet 500 mg  Status:  Discontinued        500 mg Oral Daily 03/29/21 1533 03/31/21 1027   03/27/21 1400  vancomycin (VANCOREADY) IVPB 1000 mg/200 mL  Status:  Discontinued        1,000 mg 200 mL/hr over 60 Minutes Intravenous Every 12 hours 03/26/21 2235 03/29/21 0808   03/26/21 2300  vancomycin (VANCOREADY) IVPB 1500 mg/300 mL        1,500 mg 150 mL/hr over 120 Minutes Intravenous  Once 03/26/21 2232 03/27/21 0359   03/26/21 1630  ceFEPIme (MAXIPIME) 2 g in sodium chloride 0.9 % 100 mL IVPB  Status:  Discontinued        2 g 200 mL/hr over 30 Minutes Intravenous Every 8 hours 03/26/21 1618 03/29/21 0808      Medications: Scheduled Meds: . carvedilol  3.125 mg Oral BID WC  . cefdinir  300 mg Oral Q12H  . digoxin  0.25 mg Oral Daily  . feeding supplement  237 mL Oral BID BM  . multivitamin with minerals  1 tablet Oral Daily  . sacubitril-valsartan  1 tablet Oral BID   Continuous Infusions: PRN Meds:.acetaminophen **OR** acetaminophen, traMADol    Objective: Weight change:   Intake/Output Summary (Last 24 hours) at 03/31/2021 1121 Last data filed at 03/30/2021 2200 Gross per 24 hour  Intake 300 ml  Output --  Net 300 ml   Blood pressure (!) 104/57, pulse 94, temperature 98.5 F (36.9 C), temperature source Oral, resp. rate 16, height 5\' 9"  (1.753 m), weight 72.6 kg, SpO2 97 %. Temp:  [98.5 F (36.9 C)-99.9 F (37.7 C)] 98.5 F (36.9 C) (05/02 0528) Pulse Rate:  [85-99] 94 (05/02 0816) Resp:  [16-23] 16 (05/02 0528) BP: (104-117)/(57-70) 104/57 (05/02 0528) SpO2:  [96 %-98 %] 97 % (05/02 0528)  Physical Exam: Physical Exam Constitutional:       Appearance: He is well-developed.  HENT:     Head: Normocephalic and atraumatic.  Eyes:     Conjunctiva/sclera: Conjunctivae normal.  Cardiovascular:     Rate and Rhythm: Normal rate and regular rhythm.  Pulmonary:     Effort: Pulmonary effort is normal. No respiratory distress.     Breath sounds: No wheezing.  Abdominal:     General: There is no distension.     Palpations: Abdomen is soft.  Musculoskeletal:        General: Normal range of motion.     Cervical back: Normal range of motion and neck supple.  Skin:    General: Skin is warm and dry.     Findings: Rash present. No erythema.  Neurological:     General: No focal deficit present.     Mental Status: He is alert and oriented to person, place, and time.  Psychiatric:        Mood and Affect: Mood normal.        Behavior: Behavior normal.        Thought Content: Thought content normal.        Judgment: Judgment normal.   Rash throughout prominent on scalp  face arms see pictures from today Mar 31, 2021:          CBC:    BMET Recent Labs    03/30/21 0404 03/31/21 0356  NA 133* 135  K 3.7 4.0  CL 101 102  CO2 25 26  GLUCOSE 114* 110*  BUN 24* 23*  CREATININE 0.71 0.73  CALCIUM 8.7* 9.1     Liver Panel  Recent Labs    03/30/21 0404 03/31/21 0356  PROT 6.8 7.2  ALBUMIN 2.4* 2.5*  AST 47* 49*  ALT 54* 63*  ALKPHOS 76 80  BILITOT 0.6 0.7       Sedimentation Rate No results for input(s): ESRSEDRATE in the last 72 hours. C-Reactive Protein No results for input(s): CRP in the last 72 hours.  Micro Results: Recent Results (from the past 720 hour(s))  Culture, blood (routine x 2)     Status: None   Collection Time: 03/26/21  3:15 PM   Specimen: BLOOD  Result Value Ref Range Status   Specimen Description   Final    BLOOD BLOOD RIGHT FOREARM Performed at [facility], [address] [place], [place] [address]    Special Requests   Final    BOTTLES DRAWN AEROBIC AND ANAEROBIC  Blood Culture adequate volume Performed at [facility], [address] [place], [place] [address]    Culture   Final    NO GROWTH 5 DAYS Performed at [facility], [place] [address], [place], [place] [address]    Report Status 03/31/2021 FINAL  Final  Resp Panel by RT-PCR (Flu A&B, Covid) Peripheral     Status: None   Collection Time: 03/26/21  3:20 PM   Specimen: Peripheral; Nasopharyngeal(NP) swabs in vial transport medium  Result Value Ref Range Status   SARS Coronavirus 2 by RT PCR NEGATIVE NEGATIVE Final    Comment: (NOTE) SARS-CoV-2 target nucleic acids are NOT DETECTED.  The SARS-CoV-2 RNA is generally detectable in upper respiratory specimens during the acute phase of infection. The lowest concentration of SARS-CoV-2 viral copies this assay can detect is 138 copies/mL. A negative result does not preclude SARS-Cov-2 infection and should not be used as the sole basis for treatment or other patient management decisions. A negative result may occur with  improper specimen collection/handling, submission of specimen other than nasopharyngeal swab, presence of viral mutation(s) within the areas targeted by this assay, and inadequate number of viral copies(<138 copies/mL). A negative result must be combined with clinical observations, patient history, and epidemiological information. The expected result is Negative.  Fact Sheet for Patients:  EntrepreneurPulse.com.au  Fact Sheet for Healthcare Providers:  IncredibleEmployment.be  This test is no t yet approved or cleared by the [place] FDA and  has been authorized for detection and/or diagnosis of SARS-CoV-2 by FDA under an Emergency Use Authorization (EUA). This EUA will remain  in effect (meaning this test can be used) for the duration of the COVID-19 declaration under Section 564(b)(1) of the Act, 21 U.S.C.section 360bbb-3(b)(1), unless the authorization is  terminated  or revoked sooner.       Influenza A by PCR NEGATIVE NEGATIVE Final   Influenza B by PCR NEGATIVE NEGATIVE Final    Comment: (NOTE) The Xpert Xpress SARS-CoV-2/FLU/RSV plus assay is intended as an aid in the diagnosis of influenza from Nasopharyngeal swab specimens and should not be used as a sole basis for treatment. Nasal washings and aspirates are unacceptable for Xpert Xpress SARS-CoV-2/FLU/RSV  testing.  Fact Sheet for Patients: EntrepreneurPulse.com.au  Fact Sheet for Healthcare Providers: IncredibleEmployment.be  This test is not yet approved or cleared by the [place] FDA and has been authorized for detection and/or diagnosis of SARS-CoV-2 by FDA under an Emergency Use Authorization (EUA). This EUA will remain in effect (meaning this test can be used) for the duration of the COVID-19 declaration under Section 564(b)(1) of the Act, 21 U.S.C. section 360bbb-3(b)(1), unless the authorization is terminated or revoked.  Performed at [facility], [address] [place], [place] [address]   Culture, blood (routine x 2)     Status: None   Collection Time: 03/26/21  3:24 PM   Specimen: BLOOD  Result Value Ref Range Status   Specimen Description   Final    BLOOD LEFT ANTECUBITAL Performed at [facility], [address] [place], [place] [address]    Special Requests   Final    BOTTLES DRAWN AEROBIC AND ANAEROBIC Blood Culture adequate volume Performed at [facility], [address] [place], [place] [address]    Culture   Final    NO GROWTH 5 DAYS Performed at [facility], [place] [address] [place], [place] [address]    Report Status 03/31/2021 FINAL  Final  Culture, blood (Routine X 2) w Reflex to ID Panel     Status: None (Preliminary result)   Collection Time: 03/28/21 11:46 AM   Specimen: BLOOD  Result Value Ref Range Status   Specimen Description   Final    BLOOD BLOOD  LEFT HAND Performed at [facility], [place] [address], [place], [place] [address]    Special Requests   Final    BOTTLES DRAWN AEROBIC AND ANAEROBIC Blood Culture adequate volume Performed at [place] [address], [place], [place] [address]    Culture   Final    NO GROWTH 3 DAYS Performed at [facility], [place] [address] [place], [place] [address]    Report Status PENDING  Incomplete  Culture, blood (Routine X 2) w Reflex to ID Panel     Status: None (Preliminary result)   Collection Time: 03/28/21 11:46 AM   Specimen: BLOOD  Result Value Ref Range Status   Specimen Description   Final    BLOOD BLOOD RIGHT HAND Performed at [place] [address], [place], [place] [address]    Special Requests   Final    BOTTLES DRAWN AEROBIC ONLY Blood Culture results may not be optimal due to an inadequate volume of blood received in culture bottles Performed at [place] [address], [place], [place] [address]    Culture   Final    NO GROWTH 3 DAYS Performed at [facility], [place] [address], [place], [place] [address]    Report Status PENDING  Incomplete  MRSA PCR Screening     Status: None   Collection Time: 03/28/21 12:18 PM   Specimen: Nasal Mucosa; Nasopharyngeal  Result Value Ref Range Status   MRSA by PCR NEGATIVE NEGATIVE Final    Comment:        The GeneXpert MRSA Assay (FDA approved for NASAL specimens only), is one component of a comprehensive MRSA colonization surveillance program. It is not intended to diagnose MRSA infection nor to guide or monitor treatment for MRSA infections. Performed at [facility], [place] [address] [place], [place] [address]  Respiratory (~20 pathogens) panel by PCR     Status: None   Collection Time: 03/29/21  3:10 PM   Specimen: Nasopharyngeal Swab; Respiratory  Result Value Ref Range Status   Adenovirus NOT DETECTED NOT DETECTED  Final   Coronavirus 229E NOT DETECTED NOT DETECTED Final    Comment: (NOTE) The Coronavirus on the Respiratory Panel, DOES NOT test for the novel  Coronavirus (2019 nCoV)    Coronavirus HKU1 NOT DETECTED NOT DETECTED Final   Coronavirus NL63 NOT DETECTED NOT DETECTED Final   Coronavirus OC43 NOT DETECTED NOT DETECTED Final   Metapneumovirus NOT DETECTED NOT DETECTED Final   Rhinovirus / Enterovirus NOT DETECTED NOT DETECTED Final   Influenza A NOT DETECTED NOT DETECTED Final   Influenza B NOT DETECTED NOT DETECTED Final   Parainfluenza Virus 1 NOT DETECTED NOT DETECTED Final   Parainfluenza Virus 2 NOT DETECTED NOT DETECTED Final   Parainfluenza Virus 3 NOT DETECTED NOT DETECTED Final   Parainfluenza Virus 4 NOT DETECTED NOT DETECTED Final   Respiratory Syncytial Virus NOT DETECTED NOT DETECTED Final   Bordetella pertussis NOT DETECTED NOT DETECTED Final   Bordetella Parapertussis NOT DETECTED NOT DETECTED Final   Chlamydophila pneumoniae NOT DETECTED NOT DETECTED Final   Mycoplasma pneumoniae NOT DETECTED NOT DETECTED Final    Comment: Performed at [facility], [address] [place], [place] [address]    Studies/Results: No results found.    Assessment/Plan:  INTERVAL HISTORY: Patient not undergoing biopsy due to thrombocytopenia   Principal Problem:   Multifocal pneumonia Active Problems:   Myelodysplasia, high grade (HCC)   Ecthyma gangrenosum   Pancytopenia (HCC)   Elevated brain natriuretic peptide (BNP) level   Demand ischemia (HCC)   Aortic atherosclerosis (HCC)   Malnutrition of moderate degree   Severe aortic stenosis   Acute combined systolic and diastolic congestive heart failure (HCC)   Acute congestive heart failure ([place])   Rash and nonspecific skin eruption   Immunocompromised (HCC)    Thomas Frazier is a 55 y.o. male with myelodysplastic syndrome azacitidine on 02/17/21 now cycle 2 of planned 5, complicated by pancytopenia.  He is admitted to  the hospital due to a diffuse rash.  He tells me that the rash started the day after he started ciprofloxacin fluconazole and famciclovir.  He had called his oncologist Dr. Marin Olp and had immediately stopped antibiotics.  Dr. Marin Olp was concerned that the patient might have ecthyma gangrenosum.  Patient also was found on CT to have diffuse groundglass opacities.  His respiratory panel and COVID-19 test were negative.  Cultures taken on admission were also negative  He was treated with vancomycin and cefepime and then seen by my partner Dr. Gale Journey  Cultures remain sterile fevers have defervesced.  Rash has improved in the interim.  Dr. 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  The rash has improved in the interval.  Dr. Gearldine Shown changed him over to azithromycin and cefdinir to give him better coverage for atypical organisms.  In the interim he had a little bit of a bump in his liver function tests.  The patient though overall feels globally improved.   #1 Rash: Seems to be improving would have been nice to have a biopsy to give [place] a conclusive diagnosis but for now we  will blame it on his antimicrobials that he received as an outpatient  #2 pneumonia: Does look much more like an atypical pattern he is now had a total of 6 days of antimicrobial coverage with 3 days of macrolide.  I am comfortable stopping antibiotics at this point in time given that he is also had a bump in his liver function test that could be due to one of the antibiotics.  #3 mild transaminitis: Reasonable to stop his antimicrobials given his improvement  #4 MDS: Dr Marin Olp endeavoring to get to tertiary care center for stem cell transplant  I spent greater than 35 minutes with the patient including greater than 50% of time in face to face  counsel of the patient and in coordination of his care with the primary team (Dr. Sarajane Jews).     LOS: 5 days   Alcide Evener 03/31/2021, 11:21 AM

## 2021-03-31 NOTE — Progress Notes (Signed)
Ms. Thomas Frazier is feeling a little bit better today.  His blood counts are slowly trending upward except for the hemoglobin.  His hemoglobin is 7.4.  His white cell count is 2.1.  He is still profoundly neutropenic.  His platelet count is 26,000.  The BNP is coming down.  It is 514.  He still has some shortness of breath.  He says he still has a hard time catching his breath when he talks.  Again I suspect this might be from the aortic valve and him having aortic stenosis.  It looks that these skin lesions are drying up.  He has had no cough.  There is been no bleeding.  His LFTs are little bit elevated.  Again I am not sure as to why this might be.  It could be from antibiotics.  Possibly the azithromycin that he is on.  Again, he ultimately is going to have to require a stem cell transplant in my opinion.  I am going to call [facility] and talk to them about seeing Thomas Frazier once he gets out of the hospital.  His appetite is improving.  He has had no nausea or vomiting.  His vital signs show a temperature 98.5.  Pulse 85.  Blood pressure 104/57.  His head neck exam shows no scleral icterus.  He has no oral lesions.  His lungs are clear.  Cardiac exam regular rate and rhythm.  He has a 2/6 systolic ejection murmur.  Abdomen is soft.  He has no fluid wave.  There is no palpable liver or spleen tip.  Skin exam shows a maculopapular lesions that appear to be drying up.  Neurological exam is nonfocal.  I know that Thomas Frazier has the profound neutropenia.  I suppose we could try Neupogen on him to try to get his neutrophil count up.  Since he is relatively afebrile, I think we can just watch for right now.  I would like to hope that he will be able to go home in a day or so.  I think he can probably come off the cardiac monitor.  Lattie Haw, MD  Psalms 30:2

## 2021-03-31 NOTE — Discharge Summary (Signed)
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 Marin Olp.    Acute combined systolic and diastolic CHF, aortic stenosis -- Follow-up with Dr. Einar Gip in the near future.  Mild transaminitis  --Discontinue Zithromax.  Follow-up as an outpatient.  Expect spontaneous resolution.    Follow-up Information    Thomas Berthold, PA-C Follow up on 04/11/2021.   Specialty: Cardiology Why: Thomas Frazier 05/13 at 9:15. Please bring all medications Contact information: [address] [place] [address] [phone]        Thomas Napoleon, MD. Schedule an appointment as soon as possible for a visit in 1 week(s).   Specialty: Oncology Contact information: 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 Yes; Fluid consistency: Thin  Diet effective now       Question Answer  Comment  Room service appropriate? Yes   Fluid consistency: Thin      03/26/21 2227           Filed Weights   03/26/21 1457 03/26/21 1950  Weight: 72.5 kg 72.6 kg    HPI/Hospital Course:   55 year old man PMH high-grade myelodysplasia on chemotherapy, pancytopenia, noticed rash on body approximately 48 hours prior, sent by oncologist to the emergency department. Admitted for further evaluation of suspected ecthyma gangrenosum, pancytopenia, rule out sepsis, possible multifocal pneumonia.  Received platelets and PRBC, treated with broad-spectrum antibiotics.  Followed by oncology.  Developed increasing shortness of breath, echocardiogram revealed systolic and diastolic CHF.  Seen by cardiology, treated with diuresis with improvement.  Etiology of rash unclear, biopsy requested but surgery declined given thrombocytopenia.  Given clinical improvement and resolving rash, this will be deferred at this time.  Eczema gangrenosum ruled out.  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  Discussed with infectious disease.  Completed antibiotics, no need for antibiotics on discharge.  Multifocal pneumonia versus CHF, concern for eczema gangrenosum, complicated by pancytopenia, immunosuppression secondary to chemotherapy.  Sepsis ruled out.  CTA chest no PE.  Patchy groundglass confluent opacities right greater than left lobe. -- Appears clinically resolved.  Completed antibiotics.  Appreciate infectious disease.  Rash.  Eczema gangrenosum considered but ruled out at this point.  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  Developed rash as an outpatient 4/25, instructed by Dr. Marin Olp to stop Famvir, Diflucan and Cipro. -- Slowly improving.  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 Marin Olp.  Being treated with Vidaza, status post cycle 1 started 02/17/2021. -- Platelets, hemoglobin, WBC appears stable. --Follow-up with Dr. Marin Olp who plans tertiary  care center referral for stem cell transplant.  Acute combined systolic and diastolic CHF --Echocardiogram showed low normal LVEF of 45-50% with global hypokinesis.  Moderate aortic stenosis was also noted.  However cardiology feels EF is more like 30-35% and aortic stenosis is severe. --Patient agreeable to take heart medications.  Follow-up with Dr. Einar Gip in the near future.  Mild transaminitis  --new last 48 hours possibly from Zithromax.  Discontinue Zithromax.  Follow-up as an outpatient.  Expect spontaneous resolution.  Severe aortic stenosis. --outpatient follow-up with cardiology  Aortic atherosclerosis -- No treatment indicated at this time.   Significant Hospital Events    4/27 admit for pneumonia, pancytopenia, rash  Consults:   Oncology   Cardiology   ID  Procedures:   Transfusion PRBC and plts  Significant Diagnostic Tests:   CT chest multifocal pneumonia  Echo LVEF 45-50% global hypokinesis, moderate AS  Micro Data:   4/27 BC > NGTD  [facility] 4/29 NGTD  Resp panel negative  Antimicrobials:   Cefepime 4/27 > 4/29  Vancomycin 4/27 > 4/29  Azithromycin 4/30 > 5/2  Cefdinir 4/30 > 5/2  Today's assessment: S: CC: f/u SOB  Feels much better, rash drying up, breathing better, ready to go home  O: Vitals:  Vitals:   03/31/21 0816 03/31/21 1503  BP:  110/64  Pulse: 94 97  Resp:  16  Temp:  98.9 F (37.2 C)  SpO2:  98%    Constitutional:  . Appears calm and comfortable . Ambulating in room w/o apparent SOB Respiratory:  . CTA bilaterally, no w/r/r.  . Respiratory effort normal.  Cardiovascular:  . RRR, no m/r/g Skin:  . No new lesions noted . Rash drying up and fading in places Psychiatric:  . Mental status o Mood, affect appropriate  BMP stable AST/ALT mildly elevated BNP down CBC stable  Discharge Instructions  Discharge Instructions    Diet - low sodium heart healthy   Complete by: As directed    Discharge  instructions   Complete by: As directed    Call your physician or seek immediate medical attention for pain, bleeding, swelling, rash, shortness of breath or worsening of condition.   Increase activity slowly   Complete by: As directed      Allergies as of 03/31/2021      Reactions   Ciprofloxacin Rash   Diflucan [fluconazole] Rash   Famvir [famciclovir] Rash      Medication List    STOP taking these medications   ciprofloxacin 500 MG tablet Commonly known as: Cipro   famciclovir 500 MG tablet Commonly known as: FAMVIR   fluconazole 100 MG tablet Commonly known as: DIFLUCAN   ondansetron 8 MG tablet Commonly known as: Zofran     TAKE these medications   azaCITIDine 100 MG Susr Commonly known as: VIDAZA Vidaza 142.5 mg (rounded from 143.25 mg)=75 mg/m2 x 1.91 m2 daily times five days every twenty eight days What changed:   how much to take  how to take this  when to take this   carvedilol 3.125 MG tablet Commonly known as: COREG Take 1 tablet (3.125 mg total) by mouth 2 (two) times daily with a meal.   digoxin 0.25 MG tablet Commonly known as: LANOXIN Take 1 tablet (0.25 mg total) by mouth daily. Start taking on: Apr 01, 2021   MULTIVITAMIN/IRON PO Take 1 tablet by mouth daily.   prochlorperazine 10 MG tablet Commonly known as: COMPAZINE Take 1 tablet (10 mg total) by mouth every 6 (six) hours as needed (Nausea or vomiting). What changed: reasons to take this   sacubitril-valsartan 24-26 MG Commonly known as: ENTRESTO Take 1 tablet by mouth 2 (two) times daily.   traMADol 50 MG tablet Commonly known as: ULTRAM Take 1-2 tablets (50-100 mg total) by mouth every 6 (six) hours as needed. What changed: reasons to take this      Allergies  Allergen Reactions  . Ciprofloxacin Rash  . Diflucan [Fluconazole] Rash  . Famvir [Famciclovir] Rash    The results of significant diagnostics from this hospitalization (including imaging, microbiology, ancillary  and laboratory) are listed below for reference.    Significant Diagnostic Studies: DG Chest 2 View  Result Date: 03/26/2021 CLINICAL DATA:  Shortness of breath. EXAM: CHEST - 2 VIEW COMPARISON:  March 21, 2021. FINDINGS: The heart size and mediastinal contours are within normal limits. Mild bibasilar subsegmental atelectasis is noted. No pneumothorax or pleural effusion is noted. The visualized skeletal structures are unremarkable. IMPRESSION: Mild bibasilar subsegmental atelectasis. Electronically Signed   By: Marijo Conception M.D.   On: 03/26/2021 15:53   DG Chest 2 View  Result Date: 03/21/2021 CLINICAL DATA:  Shortness of breath, fever, cough. EXAM: CHEST - 2 VIEW COMPARISON:  None. FINDINGS: The heart size and mediastinal contours are within normal limits. No pneumothorax is noted. Minimal bibasilar subsegmental atelectasis is noted with small pleural effusions. The visualized skeletal structures are unremarkable. IMPRESSION: Minimal bibasilar subsegmental atelectasis with small pleural effusions. Electronically Signed   By: Marijo Conception M.D.   On: 03/21/2021 09:31   CT Angio Chest PE W/Cm &/Or Wo Cm  Result Date: 03/26/2021 CLINICAL DATA:  PE suspected, high prob Myelodysplasia.  Chest pain, shortness of breath. EXAM: CT ANGIOGRAPHY CHEST WITH CONTRAST TECHNIQUE: Multidetector CT imaging of the chest was performed using the standard protocol during bolus administration of intravenous contrast. Multiplanar CT image reconstructions and MIPs were obtained to evaluate the vascular anatomy. CONTRAST:  126mL OMNIPAQUE IOHEXOL 350 MG/ML SOLN COMPARISON:  Radiograph earlier today, as well as 03/21/2021 FINDINGS: Cardiovascular: There are no filling defects within the pulmonary arteries to suggest pulmonary embolus. Thoracic aorta is normal in caliber. Coronary artery calcifications or stents. Aortic valvular calcifications. Borderline cardiomegaly. Trace pericardial effusion anteriorly.  Mediastinum/Nodes: Enlarged right hilar lymph nodes measuring up to 16 mm. Prominent left hilar nodes measuring up to 7 mm. Scattered prominent mediastinal nodes, largest subcarinal measuring 14 mm. No esophageal wall thickening. No thyroid nodule. Lungs/Pleura: Subpleural consolidation in the anterior left upper lobe has mild surrounding ground-glass, series 7, image 41. Smaller subpleural consolidation in the anterior right upper lobe, series 7, image 44. Patchy, ground-glass, and confluent opacities in the right greater than left lower lobe. No significant pleural effusion. Trachea and central bronchi are patent. Upper Abdomen: No acute or unexpected findings. Musculoskeletal: There are no acute or suspicious osseous abnormalities. Review of the MIP images confirms the above findings. IMPRESSION: 1. No pulmonary embolus. 2. Patchy, ground-glass, and confluent opacities in the right greater than left lower lobe. There also subpleural opacities involving the anterior left greater than right upper lobes. Findings are suspicious for multifocal pneumonia. Recommend radiographic follow-up to resolution. 3. Prominent right greater than left hilar nodes and prominent subcarinal node, likely reactive. 4. Borderline cardiomegaly.  Coronary artery calcifications. Aortic Atherosclerosis (ICD10-I70.0). Electronically  Signed   By: Keith Rake M.D.   On: 03/26/2021 19:11   DG Chest Port 1 View  Result Date: 03/29/2021 CLINICAL DATA:  Dyspnea, rash, on chemotherapy with pancytopenia EXAM: PORTABLE CHEST 1 VIEW COMPARISON:  03/28/2021 FINDINGS: Persistent bibasilar opacities without substantial change. No significant pleural effusion. No pneumothorax. Stable cardiomediastinal contours. IMPRESSION: Similar appearance of bibasilar opacities. Electronically Signed   By: Macy Mis M.D.   On: 03/29/2021 12:47   DG CHEST PORT 1 VIEW  Result Date: 03/28/2021 CLINICAL DATA:  Dyspnea EXAM: PORTABLE CHEST 1 VIEW  COMPARISON:  03/26/2021 FINDINGS: Cardiac enlargement. Progressive vascular congestion and mild interstitial edema. Progressive bibasilar airspace disease. Small bilateral effusions. IMPRESSION: Congestive heart failure with interval worsening. Progression of bibasilar atelectasis. Electronically Signed   By: Franchot Gallo M.D.   On: 03/28/2021 11:34   ECHOCARDIOGRAM COMPLETE  Result Date: 03/27/2021    ECHOCARDIOGRAM REPORT   Patient Name:   Thomas Frazier Date of Exam: 03/27/2021 Medical Rec #:  [id number]    Height:       69.0 in Accession #:    [id number]   Weight:       160.1 lb Date of Birth:  07/25/1966    BSA:          1.879 m Patient Age:    31 years     BP:           116/64 mmHg Patient Gender: M            HR:           93 bpm. Exam Location:  Inpatient Procedure: 2D Echo, Cardiac Doppler and Color Doppler Indications:    Dyspnea  History:        Patient has no prior history of Echocardiogram examinations.  Sonographer:    Luisa Hart RDCS Referring Phys: 61 Rise Patience  Sonographer Comments: Image acquisition challenging due to patient body habitus. No cardiac surgeries noted in the chart IMPRESSIONS  1. Left ventricular ejection fraction, by estimation, is 45 to 50%. The left ventricle has mildly decreased function. The left ventricle demonstrates global hypokinesis. The left ventricular internal cavity size was mildly dilated. Left ventricular diastolic parameters are consistent with Grade II diastolic dysfunction (pseudonormalization). Elevated left atrial pressure.  2. Right ventricular systolic function is normal. The right ventricular size is mildly enlarged. There is mildly elevated pulmonary artery systolic pressure. The estimated right ventricular systolic pressure is [id number] mmHg.  3. Left atrial size was mildly dilated.  4. The mitral valve is grossly normal. Trivial mitral valve regurgitation. No evidence of mitral stenosis.  5. The aortic valve is tricuspid. There is moderate  calcification of the aortic valve. There is moderate thickening of the aortic valve. Aortic valve regurgitation is mild to moderate. Moderate aortic valve stenosis. Aortic valve area, by VTI measures 1.28 cm. Aortic valve mean gradient measures 23.3 mmHg. Aortic valve Vmax measures 3.12 m/s.  6. The inferior vena cava is normal in size with greater than 50% respiratory variability, suggesting right atrial pressure of 3 mmHg. FINDINGS  Left Ventricle: Left ventricular ejection fraction, by estimation, is 45 to 50%. The left ventricle has mildly decreased function. The left ventricle demonstrates global hypokinesis. The left ventricular internal cavity size was mildly dilated. There is  no left ventricular hypertrophy. Left ventricular diastolic parameters are consistent with Grade II diastolic dysfunction (pseudonormalization). Elevated left atrial pressure. Right Ventricle: The right ventricular size is mildly enlarged. No increase in right ventricular wall  thickness. Right ventricular systolic function is normal. There is mildly elevated pulmonary artery systolic pressure. The tricuspid regurgitant velocity is 3.09 m/s, and with an assumed right atrial pressure of 3 mmHg, the estimated right ventricular systolic pressure is [id number] mmHg. Left Atrium: Left atrial size was mildly dilated. Right Atrium: Right atrial size was normal in size. Pericardium: Trivial pericardial effusion is present. Mitral Valve: The mitral valve is grossly normal. Trivial mitral valve regurgitation. No evidence of mitral valve stenosis. MV peak gradient, 7.2 mmHg. The mean mitral valve gradient is 2.0 mmHg. Tricuspid Valve: The tricuspid valve is grossly normal. Tricuspid valve regurgitation is mild . No evidence of tricuspid stenosis. Aortic Valve: The aortic valve is tricuspid. There is moderate calcification of the aortic valve. There is moderate thickening of the aortic valve. Aortic valve regurgitation is mild to moderate. Aortic  regurgitation PHT measures 215 msec. Moderate aortic stenosis is present. Aortic valve mean gradient measures 23.3 mmHg. Aortic valve peak gradient measures 39.1 mmHg. Aortic valve area, by VTI measures 1.28 cm. Pulmonic Valve: The pulmonic valve was grossly normal. Pulmonic valve regurgitation is not visualized. No evidence of pulmonic stenosis. Aorta: The aortic root and ascending aorta are structurally normal, with no evidence of dilitation. Venous: The right upper pulmonary vein is normal. The inferior vena cava is normal in size with greater than 50% respiratory variability, suggesting right atrial pressure of 3 mmHg. IAS/Shunts: The atrial septum is grossly normal.  LEFT VENTRICLE PLAX 2D LVIDd:         5.90 cm      Diastology LVIDs:         4.30 cm      LV e' medial:    6.09 cm/s LV PW:         1.10 cm      LV E/e' medial:  21.7 LV IVS:        1.20 cm      LV e' lateral:   9.25 cm/s LVOT diam:     2.20 cm      LV E/e' lateral: 14.3 LV SV:         73 LV SV Index:   39 LVOT Area:     3.80 cm  LV Volumes (MOD) LV vol d, MOD A2C: 146.0 ml LV vol d, MOD A4C: 140.0 ml LV vol s, MOD A2C: 59.3 ml LV vol s, MOD A4C: 84.8 ml LV SV MOD A2C:     86.7 ml LV SV MOD A4C:     140.0 ml LV SV MOD BP:      72.8 ml RIGHT VENTRICLE RV S prime:     20.70 cm/s TAPSE (M-mode): 2.4 cm LEFT ATRIUM             Index       RIGHT ATRIUM           Index LA diam:        3.90 cm 2.08 cm/m  RA Area:     20.90 cm LA Vol (A2C):   55.8 ml 29.69 ml/m RA Volume:   61.10 ml  32.51 ml/m LA Vol (A4C):   65.4 ml 34.80 ml/m LA Biplane Vol: 63.4 ml 33.74 ml/m  AORTIC VALVE                    PULMONIC VALVE AV Area (Vmax):    1.10 cm     PV Vmax:       1.04 m/s AV Area (Vmean):  1.17 cm     PV Vmean:      74.800 cm/s AV Area (VTI):     1.28 cm     PV VTI:        0.199 m AV Vmax:           312.50 cm/s  PV Peak grad:  4.4 mmHg AV Vmean:          227.333 cm/s PV Mean grad:  3.0 mmHg AV VTI:            0.569 m AV Peak Grad:      39.1 mmHg AV  Mean Grad:      23.3 mmHg LVOT Vmax:         90.20 cm/s LVOT Vmean:        70.000 cm/s LVOT VTI:          0.192 m LVOT/AV VTI ratio: 0.34 AI PHT:            215 msec  AORTA Ao Root diam: 3.40 cm Ao Asc diam:  3.10 cm MITRAL VALVE                TRICUSPID VALVE MV Area (PHT): 4.49 cm     TR Peak grad:   38.2 mmHg MV Peak grad:  7.2 mmHg     TR Vmax:        309.00 cm/s MV Mean grad:  2.0 mmHg MV Vmax:       1.34 m/s     SHUNTS MV Vmean:      71.3 cm/s    Systemic VTI:  0.19 m MV Decel Time: 169 msec     Systemic Diam: 2.20 cm MR Peak grad: 50.0 mmHg MR Vmax:      353.67 cm/s MV E velocity: 132.00 cm/s MV A velocity: 82.50 cm/s MV E/A ratio:  1.60 Eleonore Chiquito MD Electronically signed by Eleonore Chiquito MD Signature Date/Time: 03/27/2021/3:44:27 PM    Final     Microbiology: Recent Results (from the past 240 hour(s))  Culture, blood (routine x 2)     Status: None   Collection Time: 03/26/21  3:15 PM   Specimen: BLOOD  Result Value Ref Range Status   Specimen Description   Final    BLOOD BLOOD RIGHT FOREARM Performed at [facility], [address] [place], [place] [address]    Special Requests   Final    BOTTLES DRAWN AEROBIC AND ANAEROBIC Blood Culture adequate volume Performed at [facility], [address] [place], [place] [address]    Culture   Final    NO GROWTH 5 DAYS Performed at [facility], [place] [address] [place], [place] [address]    Report Status 03/31/2021 FINAL  Final  Resp Panel by RT-PCR (Flu A&B, Covid) Peripheral     Status: None   Collection Time: 03/26/21  3:20 PM   Specimen: Peripheral; Nasopharyngeal(NP) swabs in vial transport medium  Result Value Ref Range Status   SARS Coronavirus 2 by RT PCR NEGATIVE NEGATIVE Final    Comment: (NOTE) SARS-CoV-2 target nucleic acids are NOT DETECTED.  The SARS-CoV-2 RNA is generally detectable in upper respiratory specimens during the acute phase of infection. The lowest concentration of SARS-CoV-2  viral copies this assay can detect is 138 copies/mL. A negative result does not preclude SARS-Cov-2 infection and should not be used as the sole basis for treatment or other patient management decisions. A negative result may occur with  improper specimen collection/handling, submission of specimen other than nasopharyngeal swab, presence of viral mutation(s) within the areas targeted by this assay, and inadequate number of viral copies(<138 copies/mL). A negative result must be combined with clinical observations, patient history, and epidemiological information. The expected result is Negative.  Fact Sheet for Patients:  EntrepreneurPulse.com.au  Fact Sheet for Healthcare Providers:  IncredibleEmployment.be  This test is no t yet approved or cleared by the [place] FDA and  has been authorized for detection and/or diagnosis of SARS-CoV-2 by FDA under an Emergency Use Authorization (EUA). This EUA will remain  in effect (meaning this test can be used) for the duration of the COVID-19 declaration under Section 564(b)(1) of the Act, 21 U.S.C.section 360bbb-3(b)(1), unless the authorization is terminated  or revoked sooner.       Influenza A by PCR NEGATIVE NEGATIVE Final   Influenza B by PCR NEGATIVE NEGATIVE Final    Comment: (NOTE) The Xpert Xpress SARS-CoV-2/FLU/RSV plus assay is intended as an aid in the diagnosis of influenza from Nasopharyngeal swab specimens and should not be used as a sole basis for treatment. Nasal washings and aspirates are unacceptable for Xpert Xpress SARS-CoV-2/FLU/RSV testing.  Fact Sheet for Patients: EntrepreneurPulse.com.au  Fact Sheet for Healthcare Providers: IncredibleEmployment.be  This test is not yet approved or cleared by the [place] FDA and has been authorized for detection and/or diagnosis of SARS-CoV-2 by FDA under an Emergency Use Authorization  (EUA). This EUA will remain in effect (meaning this test can be used) for the duration of the COVID-19 declaration under Section 564(b)(1) of the Act, 21 U.S.C. section 360bbb-3(b)(1), unless the authorization is terminated or revoked.  Performed at [facility], [address] [place], [place] [address]   Culture, blood (routine x 2)     Status: None   Collection Time: 03/26/21  3:24 PM   Specimen: BLOOD  Result Value Ref Range Status   Specimen Description   Final    BLOOD LEFT ANTECUBITAL Performed at [facility], [address] [place], [place] [address]    Special Requests   Final    BOTTLES DRAWN AEROBIC AND ANAEROBIC Blood Culture adequate volume Performed at [facility], [address] [place], [place] [address]    Culture   Final    NO GROWTH 5 DAYS Performed at [facility], [place] [address], [place], [place] [address]    Report Status 03/31/2021 FINAL  Final  Culture, blood (Routine X 2) w Reflex to ID Panel     Status: None (Preliminary result)   Collection Time: 03/28/21 11:46 AM   Specimen: BLOOD  Result Value Ref Range Status   Specimen Description   Final    BLOOD BLOOD LEFT HAND Performed at [facility], [place] [address], [place], [place] [address]    Special Requests   Final    BOTTLES DRAWN AEROBIC AND ANAEROBIC Blood Culture adequate volume Performed at [place] [address], [place], [place] [address]    Culture   Final    NO GROWTH 3 DAYS Performed at [facility], [place] [address], [place], [place] [address]    Report Status PENDING  Incomplete  Culture, blood (Routine X 2) w Reflex to ID Panel     Status: None (Preliminary result)   Collection Time: 03/28/21 11:46 AM   Specimen: BLOOD  Result Value Ref Range Status   Specimen Description  Final    BLOOD BLOOD RIGHT HAND Performed at [place] [address] [place], [place] [address]     Special Requests   Final    BOTTLES DRAWN AEROBIC ONLY Blood Culture results may not be optimal due to an inadequate volume of blood received in culture bottles Performed at [place] [address], [place], [place] [address]    Culture   Final    NO GROWTH 3 DAYS Performed at [facility], [place] [address], [place], [place] [address]    Report Status PENDING  Incomplete  MRSA PCR Screening     Status: None   Collection Time: 03/28/21 12:18 PM   Specimen: Nasal Mucosa; Nasopharyngeal  Result Value Ref Range Status   MRSA by PCR NEGATIVE NEGATIVE Final    Comment:        The GeneXpert MRSA Assay (FDA approved for NASAL specimens only), is one component of a comprehensive MRSA colonization surveillance program. It is not intended to diagnose MRSA infection nor to guide or monitor treatment for MRSA infections. Performed at [facility], [place] [address], [place], [place] [address]   Respiratory (~20 pathogens) panel by PCR     Status: None   Collection Time: 03/29/21  3:10 PM   Specimen: Nasopharyngeal Swab; Respiratory  Result Value Ref Range Status   Adenovirus NOT DETECTED NOT DETECTED Final   Coronavirus 229E NOT DETECTED NOT DETECTED Final    Comment: (NOTE) The Coronavirus on the Respiratory Panel, DOES NOT test for the novel  Coronavirus (2019 nCoV)    Coronavirus HKU1 NOT DETECTED NOT DETECTED Final   Coronavirus NL63 NOT DETECTED NOT DETECTED Final   Coronavirus OC43 NOT DETECTED NOT DETECTED Final   Metapneumovirus NOT DETECTED NOT DETECTED Final   Rhinovirus / Enterovirus NOT DETECTED NOT DETECTED Final   Influenza A NOT DETECTED NOT DETECTED Final   Influenza B NOT DETECTED NOT DETECTED Final   Parainfluenza Virus 1 NOT DETECTED NOT DETECTED Final   Parainfluenza Virus 2 NOT DETECTED NOT DETECTED Final   Parainfluenza Virus 3 NOT DETECTED NOT DETECTED Final   Parainfluenza Virus 4 NOT DETECTED NOT DETECTED Final    Respiratory Syncytial Virus NOT DETECTED NOT DETECTED Final   Bordetella pertussis NOT DETECTED NOT DETECTED Final   Bordetella Parapertussis NOT DETECTED NOT DETECTED Final   Chlamydophila pneumoniae NOT DETECTED NOT DETECTED Final   Mycoplasma pneumoniae NOT DETECTED NOT DETECTED Final    Comment: Performed at [facility], [place]. [address] [place], [place] [address]     Labs: Basic Metabolic Panel: Recent Labs  Lab 03/27/21 0435 03/28/21 0905 03/29/21 0313 03/30/21 0404 03/31/21 0356  NA 134* 137 133* 133* 135  K 4.1 3.6 3.8 3.7 4.0  CL 103 103 101 101 102  CO2 23 25 24 25 26   GLUCOSE 172* 122* 125* 114* 110*  BUN 17 20 25* 24* 23*  CREATININE 0.88 0.98 0.76 0.71 0.73  CALCIUM 8.7* 8.8* 8.4* 8.7* 9.1   Liver Function Tests: Recent Labs  Lab 03/27/21 0435 03/28/21 0905 03/29/21 0313 03/30/21 0404 03/31/21 0356  AST 22 29 29  47* 49*  ALT 32 36 35 54* 63*  ALKPHOS 65 75 66 76 80  BILITOT 1.0 0.8 0.7 0.6 0.7  PROT 6.8 7.4 6.5 6.8 7.2  ALBUMIN 2.7* 2.8* 2.5* 2.4* 2.5*   CBC: Recent Labs  Lab 03/27/21 0321 03/28/21 0905 03/29/21 0313 03/30/21 0404 03/31/21 0356  WBC 1.7*  2.0* 1.9* 2.0* 2.1*  NEUTROABS 0.2* 0.2* 0.2* 0.2* 0.1*  HGB 6.7* 8.7* 7.3* 7.6* 7.4*  HCT 21.3* 26.6* 22.6* 23.5* 23.1*  MCV 86.9 85.8 87.3 87.0 86.8  PLT 12* 23* 20* 21* 26*    Recent Labs    03/29/21 0313 03/30/21 0404 03/31/21 0356  BNP 882.0* 644.7* 513.9*   Principal Problem:   Multifocal pneumonia Active Problems:   Myelodysplasia, high grade (HCC)   Ecthyma gangrenosum   Pancytopenia (HCC)   Elevated brain natriuretic peptide (BNP) level   Demand ischemia (HCC)   Aortic atherosclerosis (HCC)   Malnutrition of moderate degree   Severe aortic stenosis   Acute combined systolic and diastolic congestive heart failure (HCC)   Acute congestive heart failure (HCC)   Drug rash   Immunocompromised (HCC)   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

## 2021-04-01 ENCOUNTER — Inpatient Hospital Stay: Payer: Self-pay

## 2021-04-02 ENCOUNTER — Inpatient Hospital Stay: Payer: Self-pay

## 2021-04-02 LAB — CULTURE, BLOOD (ROUTINE X 2)
Culture: NO GROWTH
Culture: NO GROWTH
Special Requests: ADEQUATE

## 2021-04-03 ENCOUNTER — Inpatient Hospital Stay: Payer: Self-pay

## 2021-04-03 LAB — FUNGITELL, SERUM: Fungitell Result: <31 pg/mL (ref <80)

## 2021-04-04 ENCOUNTER — Inpatient Hospital Stay: Payer: Self-pay

## 2021-04-07 ENCOUNTER — Telehealth: Payer: Self-pay | Admitting: *Deleted

## 2021-04-07 ENCOUNTER — Other Ambulatory Visit: Payer: Self-pay

## 2021-04-07 ENCOUNTER — Inpatient Hospital Stay: Payer: Self-pay | Attending: Hematology & Oncology

## 2021-04-07 ENCOUNTER — Telehealth: Payer: Self-pay

## 2021-04-07 ENCOUNTER — Inpatient Hospital Stay: Payer: Self-pay

## 2021-04-07 ENCOUNTER — Encounter: Payer: Self-pay | Admitting: Hematology & Oncology

## 2021-04-07 ENCOUNTER — Inpatient Hospital Stay: Payer: Self-pay | Attending: Family | Admitting: Hematology & Oncology

## 2021-04-07 VITALS — BP 108/61 | HR 87 | Temp 98.2°F | Resp 16 | Ht 69.0 in | Wt 152.0 lb

## 2021-04-07 DIAGNOSIS — D46Z Other myelodysplastic syndromes: Secondary | ICD-10-CM

## 2021-04-07 DIAGNOSIS — Z79899 Other long term (current) drug therapy: Secondary | ICD-10-CM | POA: Insufficient documentation

## 2021-04-07 DIAGNOSIS — D461 Refractory anemia with ring sideroblasts: Secondary | ICD-10-CM | POA: Insufficient documentation

## 2021-04-07 DIAGNOSIS — D469 Myelodysplastic syndrome, unspecified: Secondary | ICD-10-CM | POA: Insufficient documentation

## 2021-04-07 DIAGNOSIS — Z5111 Encounter for antineoplastic chemotherapy: Secondary | ICD-10-CM | POA: Insufficient documentation

## 2021-04-07 LAB — CBC WITH DIFFERENTIAL (CANCER CENTER ONLY)
Abs Immature Granulocytes: 0 10*3/uL (ref 0.00–0.07)
Basophils Absolute: 0 10*3/uL (ref 0.0–0.1)
Basophils Relative: 0 %
Eosinophils Absolute: 0 10*3/uL (ref 0.0–0.5)
Eosinophils Relative: 0 %
HCT: 24.8 % — ABNORMAL LOW (ref 39.0–52.0)
Hemoglobin: 7.8 g/dL — ABNORMAL LOW (ref 13.0–17.0)
Immature Granulocytes: 0 %
Lymphocytes Relative: 83 %
Lymphs Abs: 2.1 10*3/uL (ref 0.7–4.0)
MCH: 27.1 pg (ref 26.0–34.0)
MCHC: 31.5 g/dL (ref 30.0–36.0)
MCV: 86.1 fL (ref 80.0–100.0)
Monocytes Absolute: 0.2 10*3/uL (ref 0.1–1.0)
Monocytes Relative: 8 %
Neutro Abs: 0.2 10*3/uL — CL (ref 1.7–7.7)
Neutrophils Relative %: 9 %
Platelet Count: 52 10*3/uL — ABNORMAL LOW (ref 150–400)
RBC: 2.88 MIL/uL — ABNORMAL LOW (ref 4.22–5.81)
RDW: 26.3 % — ABNORMAL HIGH (ref 11.5–15.5)
WBC Count: 2.5 10*3/uL — ABNORMAL LOW (ref 4.0–10.5)
nRBC: 70 % — ABNORMAL HIGH (ref 0.0–0.2)

## 2021-04-07 LAB — BASIC METABOLIC PANEL - CANCER CENTER ONLY
Anion gap: 4 — ABNORMAL LOW (ref 5–15)
BUN: 25 mg/dL — ABNORMAL HIGH (ref 6–20)
CO2: 31 mmol/L (ref 22–32)
Calcium: 10.1 mg/dL (ref 8.9–10.3)
Chloride: 97 mmol/L — ABNORMAL LOW (ref 98–111)
Creatinine: 0.75 mg/dL (ref 0.61–1.24)
GFR, Estimated: >60 mL/min (ref >60)
Glucose, Bld: 99 mg/dL (ref 70–99)
Potassium: 4 mmol/L (ref 3.5–5.1)
Sodium: 132 mmol/L — ABNORMAL LOW (ref 135–145)

## 2021-04-07 MED ORDER — ONDANSETRON HCL 8 MG PO TABS: 8.0000 mg | ORAL_TABLET | Freq: Once | ORAL | Status: DC

## 2021-04-07 MED ORDER — ONDANSETRON HCL 8 MG PO TABS
ORAL_TABLET | ORAL | Status: AC
  Filled 2021-04-07: qty 1

## 2021-04-07 MED ORDER — AZACITIDINE CHEMO SQ INJECTION
75.0000 mg/m2 | Freq: Once | INTRAMUSCULAR | Status: AC
  Administered 2021-04-07: 142.5 mg via SUBCUTANEOUS
  Filled 2021-04-07: qty 5.7

## 2021-04-07 NOTE — Progress Notes (Signed)
Reviewed pt CBC with Dr. Marin Olp and ok to treat with ANC 0.2 and platelets 52 and hemoglobin 7.8

## 2021-04-07 NOTE — Progress Notes (Signed)
Hematology and Oncology Follow Up Visit  Tor Tsuda [id number] 1966/04/02 55 y.o. 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  S/p cycle #1 -- start on 02/17/2021     Interim History:  Mr. Wlodarczyk is back for follow-up.  He now is back after being in the hospital.  He was hospitalized probably for a week or so.  He just looked septic.  Thankfully all of his cultures were negative.  I was worried that he may have a ecthyma gangrenosum.  He had these maculopapular skin lesions.  Unfortunately, surgery was not willing to biopsy him because of his platelet count.  He is going go out to [facility] to see one of the leukemia specialist out there.  I called Dr. Florene Glen about this.  Dr. Florene Glen says we can send him out there probably in about a week or so.  He is eating better.  He started to gain a little bit of weight.  He has had no problems with nausea or vomiting.  He has had no issues with bowels or bladder.  There is been no diarrhea.  There is been no issues with fever.  The skin lesions that he has not have flattened out quite nicely.  Currently, I would say performance status is ECOG 1.    Medications:  Current Outpatient Medications:  .  azaCITIDine (VIDAZA) 100 MG SUSR, Vidaza 142.5 mg (rounded from 143.25 mg)=75 mg/m2 x 1.91 m2 daily times five days every twenty eight days (Patient taking differently: Inject 100 mg into the vein See admin instructions. Vidaza 142.5 mg (rounded from 143.25 mg)=75 mg/m2 x 1.91 m2 daily times five days every twenty eight days), Disp: 1 each, Rfl: 0 .  carvedilol (COREG) 3.125 MG tablet, Take 1 tablet (3.125 mg total) by mouth 2 (two) times daily with a meal., Disp: 30 tablet, Rfl: 1 .  digoxin (LANOXIN) 0.25 MG tablet, Take 1 tablet (0.25 mg total) by mouth daily., Disp: 30 tablet,  Rfl: 1 .  Multiple Vitamins-Iron (MULTIVITAMIN/IRON PO), Take 1 tablet by mouth daily., Disp: , Rfl:  .  prochlorperazine (COMPAZINE) 10 MG tablet, Take 1 tablet (10 mg total) by mouth every 6 (six) hours as needed (Nausea or vomiting). (Patient taking differently: Take 10 mg by mouth every 6 (six) hours as needed for nausea or vomiting (Nausea or vomiting).), Disp: 30 tablet, Rfl: 1 .  sacubitril-valsartan (ENTRESTO) 24-26 MG, Take 1 tablet by mouth 2 (two) times daily., Disp: 60 tablet, Rfl: 1 .  traMADol (ULTRAM) 50 MG tablet, Take 1-2 tablets (50-100 mg total) by mouth every 6 (six) hours as needed. (Patient taking differently: Take 50-100 mg by mouth every 6 (six) hours as needed for severe pain.), Disp: 60 tablet, Rfl: 0  Allergies:  Allergies  Allergen Reactions  . Ciprofloxacin Rash  . Diflucan [Fluconazole] Rash  . Famvir [Famciclovir] Rash    Past Medical History, Surgical history, Social history, and Family History were reviewed and updated.  Review of Systems: Review of Systems  HENT:   Positive for nosebleeds.   Eyes: Negative.   Respiratory: Positive for chest tightness.   Cardiovascular: Negative.   Endocrine: Negative.   Genitourinary: Negative.    Musculoskeletal: Positive for arthralgias, back pain and myalgias.  Skin: Negative.   Neurological: Negative.   Hematological: Negative.   Psychiatric/Behavioral: Negative.  Physical Exam:  height is 5\' 9"  (1.753 m) and weight is 152 lb (68.9 kg). His oral temperature is 98.2 F (36.8 C). His blood pressure is 108/61 and his pulse is 87. His respiration is 16 and oxygen saturation is 100%.   Wt Readings from Last 3 Encounters:  04/07/21 152 lb (68.9 kg)  03/26/21 160 lb 0.9 oz (72.6 kg)  03/21/21 160 lb (72.6 kg)    Physical Exam Vitals reviewed.  HENT:     Head: Normocephalic and atraumatic.  Eyes:     Pupils: Pupils are equal, round, and reactive to light.  Cardiovascular:     Rate and Rhythm: Normal  rate and regular rhythm.     Heart sounds: Normal heart sounds.  Pulmonary:     Effort: Pulmonary effort is normal.     Breath sounds: Normal breath sounds.  Abdominal:     General: Bowel sounds are normal.     Palpations: Abdomen is soft.  Musculoskeletal:        General: No tenderness or deformity. Normal range of motion.     Cervical back: Normal range of motion.  Lymphadenopathy:     Cervical: No cervical adenopathy.  Skin:    Findings: No erythema or rash.     Comments: On his skin, he has  maculopapule lesions.  These have a central eschar.  They are somewhat tender.  There is no exudate from these.  They are on his face, arms, neck, upper chest and legs.  Neurological:     Mental Status: He is alert and oriented to person, place, and time.  Psychiatric:        Behavior: Behavior normal.        Thought Content: Thought content normal.        Judgment: Judgment normal.      Lab Results  Component Value Date   WBC 2.5 (L) 04/07/2021   HGB 7.8 (L) 04/07/2021   HCT 24.8 (L) 04/07/2021   MCV 86.1 04/07/2021   PLT 52 (L) 04/07/2021     Chemistry      Component Value Date/Time   NA 132 (L) 04/07/2021 0819   K 4.0 04/07/2021 0819   CL 97 (L) 04/07/2021 0819   CO2 31 04/07/2021 0819   BUN 25 (H) 04/07/2021 0819   CREATININE 0.75 04/07/2021 0819      Component Value Date/Time   CALCIUM 10.1 04/07/2021 0819   ALKPHOS 80 03/31/2021 0356   AST 49 (H) 03/31/2021 0356   AST 21 03/26/2021 1051   ALT 63 (H) 03/31/2021 0356   ALT 32 03/26/2021 1051   BILITOT 0.7 03/31/2021 0356   BILITOT 1.1 03/26/2021 1051      Impression and Plan: Mr. Bufkin is a very nice 55 year old Hispanic male.  He has high-grade myelodysplasia.  This is evidenced by the complex cytogenetics that he has.  He does look better.  Hopefully, we will see that his blood counts will start to improve little bit more.  We will go ahead with Vidaza again.  He will get 5 days of Vidaza.  We will hold off  on adding venetoclax right now.  Will be very interesting to see what Mina Marble has to say about his situation.  I do believe that he is a transplant candidate given that he has high-grade myelodysplasia as manifested by his multiple chromosomal abnormalities.  We have to take his blood counts routinely.  Recommended I does have a cardiac murmur.  He has aortic stenosis  that is going to have to be treated at some point.  He is going to have the valve repaired or replaced.  Again, his blood issues are certainly more pressing right now.  I will plan to get him back to see me when he starts his third cycle of treatment, if he is not already at [facility] getting ready for a transplant.    [name], MD 5/9/20229:59 AM

## 2021-04-07 NOTE — Patient Instructions (Signed)
[facility]  Discharge Instructions: °Thank you for choosing [facility] to provide your oncology and hematology care.  ° °If you have a lab appointment with the Cancer Center, please go directly to the Cancer Center and check in at the registration area. ° °Wear comfortable clothing and clothing appropriate for easy access to any Portacath or PICC line.  ° °We strive to give you quality time with your provider. You may need to reschedule your appointment if you arrive late (15 or more minutes).  Arriving late affects you and other patients whose appointments are after yours.  Also, if you miss three or more appointments without notifying the office, you may be dismissed from the clinic at the provider’s discretion.    °  °For prescription refill requests, have your pharmacy contact our office and allow 72 hours for refills to be completed.   ° °Today you received the following chemotherapy and/or immunotherapy agents Vidaza °  °To help prevent nausea and vomiting after your treatment, we encourage you to take your nausea medication as directed. ° °BELOW ARE SYMPTOMS THAT SHOULD BE REPORTED IMMEDIATELY: °*FEVER GREATER THAN 100.4 F (38 °C) OR HIGHER °*CHILLS OR SWEATING °*NAUSEA AND VOMITING THAT IS NOT CONTROLLED WITH YOUR NAUSEA MEDICATION °*UNUSUAL SHORTNESS OF BREATH °*UNUSUAL BRUISING OR BLEEDING °*URINARY PROBLEMS (pain or burning when urinating, or frequent urination) °*BOWEL PROBLEMS (unusual diarrhea, constipation, pain near the anus) °TENDERNESS IN MOUTH AND THROAT WITH OR WITHOUT PRESENCE OF ULCERS (sore throat, sores in mouth, or a toothache) °UNUSUAL RASH, SWELLING OR PAIN  °UNUSUAL VAGINAL DISCHARGE OR ITCHING  ° °Items with * indicate a potential emergency and should be followed up as soon as possible or go to the Emergency Department if any problems should occur. ° °Please show the CHEMOTHERAPY ALERT CARD or IMMUNOTHERAPY ALERT CARD at check-in to the  Emergency Department and triage nurse. °Should you have questions after your visit or need to cancel or reschedule your appointment, please contact [facility]  [phone] and follow the prompts.  Office hours are 8:00 a.m. to 4:30 p.m. Monday - Friday. Please note that voicemails left after 4:00 p.m. may not be returned until the following business day.  We are closed weekends and major holidays. You have access to a nurse at all times for urgent questions. Please call the main number to the clinic [phone] and follow the prompts. ° °For any non-urgent questions, you may also contact your provider using MyChart. We now offer e-Visits for anyone 18 and older to request care online for non-urgent symptoms. For details visit [email or web address]. °  °Also download the MyChart app! Go to the app store, search "MyChart", open the app, select [place], and log in with your MyChart username and password. ° °Due to Covid, a mask is required upon entering the hospital/clinic. If you do not have a mask, one will be given to you upon arrival. For doctor visits, patients may have 1 support person aged 18 or older with them. For treatment visits, patients cannot have anyone with them due to current Covid guidelines and our immunocompromised population.  °

## 2021-04-07 NOTE — Telephone Encounter (Signed)
appts made per 04/07/21 los and pt to gain his appts in tx/avs and through [employer]

## 2021-04-07 NOTE — Telephone Encounter (Signed)
Dr. Ennever notified of ANC-0.2.  No new orders received at this time.  

## 2021-04-08 ENCOUNTER — Inpatient Hospital Stay: Payer: Self-pay

## 2021-04-08 VITALS — BP 117/53 | HR 91 | Temp 97.9°F | Resp 16

## 2021-04-08 DIAGNOSIS — D46Z Other myelodysplastic syndromes: Secondary | ICD-10-CM

## 2021-04-08 MED ORDER — ONDANSETRON HCL 8 MG PO TABS: 8.0000 mg | ORAL_TABLET | Freq: Once | ORAL | Status: DC

## 2021-04-08 MED ORDER — AZACITIDINE CHEMO SQ INJECTION
75.0000 mg/m2 | Freq: Once | INTRAMUSCULAR | Status: AC
  Administered 2021-04-08: 142.5 mg via SUBCUTANEOUS
  Filled 2021-04-08: qty 5.7

## 2021-04-08 NOTE — Patient Instructions (Signed)
[facility]  Discharge Instructions: °Thank you for choosing [facility] to provide your oncology and hematology care.  ° °If you have a lab appointment with the Cancer Center, please go directly to the Cancer Center and check in at the registration area. ° °Wear comfortable clothing and clothing appropriate for easy access to any Portacath or PICC line.  ° °We strive to give you quality time with your provider. You may need to reschedule your appointment if you arrive late (15 or more minutes).  Arriving late affects you and other patients whose appointments are after yours.  Also, if you miss three or more appointments without notifying the office, you may be dismissed from the clinic at the provider’s discretion.    °  °For prescription refill requests, have your pharmacy contact our office and allow 72 hours for refills to be completed.   ° °Today you received the following chemotherapy and/or immunotherapy agents Vidaza °  °To help prevent nausea and vomiting after your treatment, we encourage you to take your nausea medication as directed. ° °BELOW ARE SYMPTOMS THAT SHOULD BE REPORTED IMMEDIATELY: °*FEVER GREATER THAN 100.4 F (38 °C) OR HIGHER °*CHILLS OR SWEATING °*NAUSEA AND VOMITING THAT IS NOT CONTROLLED WITH YOUR NAUSEA MEDICATION °*UNUSUAL SHORTNESS OF BREATH °*UNUSUAL BRUISING OR BLEEDING °*URINARY PROBLEMS (pain or burning when urinating, or frequent urination) °*BOWEL PROBLEMS (unusual diarrhea, constipation, pain near the anus) °TENDERNESS IN MOUTH AND THROAT WITH OR WITHOUT PRESENCE OF ULCERS (sore throat, sores in mouth, or a toothache) °UNUSUAL RASH, SWELLING OR PAIN  °UNUSUAL VAGINAL DISCHARGE OR ITCHING  ° °Items with * indicate a potential emergency and should be followed up as soon as possible or go to the Emergency Department if any problems should occur. ° °Please show the CHEMOTHERAPY ALERT CARD or IMMUNOTHERAPY ALERT CARD at check-in to the  Emergency Department and triage nurse. °Should you have questions after your visit or need to cancel or reschedule your appointment, please contact [facility]  [phone] and follow the prompts.  Office hours are 8:00 a.m. to 4:30 p.m. Monday - Friday. Please note that voicemails left after 4:00 p.m. may not be returned until the following business day.  We are closed weekends and major holidays. You have access to a nurse at all times for urgent questions. Please call the main number to the clinic [phone] and follow the prompts. ° °For any non-urgent questions, you may also contact your provider using MyChart. We now offer e-Visits for anyone 18 and older to request care online for non-urgent symptoms. For details visit [email or web address]. °  °Also download the MyChart app! Go to the app store, search "MyChart", open the app, select [facility], and log in with your MyChart username and password. ° °Due to Covid, a mask is required upon entering the hospital/clinic. If you do not have a mask, one will be given to you upon arrival. For doctor visits, patients may have 1 support person aged 18 or older with them. For treatment visits, patients cannot have anyone with them due to current Covid guidelines and our immunocompromised population.  °

## 2021-04-09 ENCOUNTER — Other Ambulatory Visit: Payer: Self-pay

## 2021-04-09 ENCOUNTER — Inpatient Hospital Stay: Payer: Self-pay

## 2021-04-09 VITALS — BP 119/58 | HR 95 | Temp 98.4°F

## 2021-04-09 DIAGNOSIS — D46Z Other myelodysplastic syndromes: Secondary | ICD-10-CM

## 2021-04-09 MED ORDER — ONDANSETRON HCL 8 MG PO TABS: 8.0000 mg | ORAL_TABLET | Freq: Once | ORAL | Status: DC

## 2021-04-09 MED ORDER — AZACITIDINE CHEMO SQ INJECTION
75.0000 mg/m2 | Freq: Once | INTRAMUSCULAR | Status: AC
  Administered 2021-04-09: 142.5 mg via SUBCUTANEOUS
  Filled 2021-04-09: qty 5.7

## 2021-04-09 NOTE — Progress Notes (Signed)
Primary Physician/Referring:  Cathleen Corti, PA-C  Patient ID: Thomas Frazier, male    DOB: 12-11-1965, 55 y.o.   MRN: [id number]  Chief Complaint  Patient presents with  . Congestive Heart Failure  . Hospitalization Follow-up    10 days  . New Patient (Initial Visit)   HPI:    Thomas Frazier  is a 55 y.o.  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  Patient denies significant cardiovascular risk factors.  No history of hypertension, hyperlipidemia, diabetes, no history of premature CAD. No known history of CAD.  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  Guideline directed medical therapy was initiated including carvedilol and Entresto, as well as digoxin and Lasix.  Patient responded well to diuresis.  Suspected severe aortic stenosis to be etiology of cardiomyopathy and has no significant troponin leak suspect this is chronic.  Patient now presents for follow up.  Patient reports since hospitalization he has been feeling significantly better.  He has had no recurrence of dyspnea.  Denies chest pain, palpitations, dizziness, syncope, near syncope.  Denies orthopnea, PND, leg swelling.  Patient does admit that he has been unable to afford Entresto and is therefore not been taking it since discharge from the hospital.  He is tolerating carvedilol and digoxin without issue.  He also reports rash has slowly been improving since discharge from the hospital.   Past Medical History:  Diagnosis Date  . Acute diastolic CHF (congestive heart failure) ([place]) 03/28/2021  . Aortic atherosclerosis ([place]) 03/27/2021  . 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 APPENDECTOMY     Family History  Problem Relation Age of Onset  . Hiatal hernia Mother        Erupted inside her body  . Healthy Sister   . Healthy Brother   . Healthy Sister   . Hyperlipidemia Brother   . Hyperlipidemia Brother     Social History   Tobacco Use  . Smoking status: Never Smoker  . Smokeless tobacco: Never Used  Substance Use Topics  . Alcohol use: Not Currently   Marital Status: Married   ROS  Review of Systems  Constitutional: Negative for malaise/fatigue and weight gain.  Cardiovascular: Negative for chest pain, claudication, leg swelling, near-syncope, orthopnea, palpitations, paroxysmal nocturnal dyspnea and syncope.  Respiratory: Negative for shortness of breath.   Hematologic/Lymphatic: Does not bruise/bleed easily.  Gastrointestinal: Negative for melena.  Neurological: Negative for dizziness and weakness.    Objective  Blood pressure 115/70, pulse 97, temperature 98.2 F (36.8 C), temperature source Temporal, resp. rate 16, height 5\' 9"  (1.753 m), weight 149 lb 9.6 oz (67.9 kg), SpO2 98 %.  Vitals with BMI 04/11/2021 04/11/2021 04/10/2021  Height - 5\' 9"  -  Weight - 149 lbs 10 oz -  BMI - 26.71 -  Systolic 245 809 983  Diastolic 57 70 63  Pulse 94 97 99      Physical Exam Vitals reviewed.  Constitutional:      Comments: Appears older than stated age.  HENT:     Head: Normocephalic and atraumatic.  Cardiovascular:     Rate and Rhythm: Normal rate and regular rhythm.     Pulses: Intact distal pulses.  Carotid pulses are on the right side with bruit and on the left side with bruit.    Heart sounds: S1 normal and S2 normal. Murmur heard.   Harsh midsystolic murmur is present with a grade of 3/6 at the upper right sternal border radiating to the neck. No gallop.   Pulmonary:     Effort: Pulmonary effort is normal. No respiratory distress.     Breath sounds: No wheezing, rhonchi  or rales.  Abdominal:     General: Bowel sounds are normal.  Musculoskeletal:     Right lower leg: No edema.     Left lower leg: No edema.  Skin:    General: Skin is warm and dry.  Neurological:     Mental Status: He is alert.     Laboratory examination:   Recent Labs    03/30/21 0404 03/31/21 0356 04/07/21 0819  NA 133* 135 132*  K 3.7 4.0 4.0  CL 101 102 97*  CO2 25 26 31   GLUCOSE 114* 110* 99  BUN 24* 23* 25*  CREATININE 0.71 0.73 0.75  CALCIUM 8.7* 9.1 10.1  GFRNONAA >60 >60 >60   estimated creatinine clearance is 99 mL/min (by C-G formula based on SCr of 0.75 mg/dL).  CMP Latest Ref Rng & Units 04/07/2021 03/31/2021 03/30/2021  Glucose 70 - 99 mg/dL 99 110(H) 114(H)  BUN 6 - 20 mg/dL 25(H) 23(H) 24(H)  Creatinine 0.61 - 1.24 mg/dL 0.75 0.73 0.71  Sodium 135 - 145 mmol/L 132(L) 135 133(L)  Potassium 3.5 - 5.1 mmol/L 4.0 4.0 3.7  Chloride 98 - 111 mmol/L 97(L) 102 101  CO2 22 - 32 mmol/L 31 26 25   Calcium 8.9 - 10.3 mg/dL 10.1 9.1 8.7(L)  Total Protein 6.5 - 8.1 g/dL - 7.2 6.8  Total Bilirubin 0.3 - 1.2 mg/dL - 0.7 0.6  Alkaline Phos 38 - 126 U/L - 80 76  AST 15 - 41 U/L - 49(H) 47(H)  ALT 0 - 44 U/L - 63(H) 54(H)   CBC Latest Ref Rng & Units 04/07/2021 03/31/2021 03/30/2021  WBC 4.0 - 10.5 K/uL 2.5(L) 2.1(L) 2.0(L)  Hemoglobin 13.0 - 17.0 g/dL 7.8(L) 7.4(L) 7.6(L)  Hematocrit 39.0 - 52.0 % 24.8(L) 23.1(L) 23.5(L)  Platelets 150 - 400 K/uL 52(L) 26(LL) 21(LL)    Lipid Panel No results for input(s): CHOL, TRIG, LDLCALC, VLDL, HDL, CHOLHDL, LDLDIRECT in the last 8760 hours.  HEMOGLOBIN A1C No results found for: HGBA1C, MPG TSH No results for input(s): TSH in the last 8760 hours.  External labs:   None   Medications and allergies   Allergies  Allergen Reactions  . Ciprofloxacin Rash  . Diflucan [Fluconazole] Rash  . Famvir [Famciclovir] Rash     Outpatient Medications Prior to Visit  Medication Sig Dispense Refill  . azaCITIDine (VIDAZA) 100 MG SUSR  Vidaza 142.5 mg (rounded from 143.25 mg)=75 mg/m2 x 1.91 m2 daily times five days every twenty eight days (Patient taking differently: Inject 100 mg into the vein See admin instructions. Vidaza 142.5 mg (rounded from 143.25 mg)=75 mg/m2 x 1.91 m2 daily times five days every twenty eight days) 1 each 0  . Multiple Vitamins-Iron (MULTIVITAMIN/IRON PO) Take 1 tablet by mouth daily.    . prochlorperazine (COMPAZINE) 10 MG tablet Take 1 tablet (10 mg total) by mouth every 6 (six) hours as needed (Nausea or vomiting). (Patient taking differently: Take 10 mg by mouth every 6 (six) hours as needed for nausea or vomiting (Nausea or vomiting).) 30 tablet 1  .  sacubitril-valsartan (ENTRESTO) 24-26 MG Take 1 tablet by mouth 2 (two) times daily. 60 tablet 1  . carvedilol (COREG) 3.125 MG tablet Take 1 tablet (3.125 mg total) by mouth 2 (two) times daily with a meal. 30 tablet 1  . digoxin (LANOXIN) 0.25 MG tablet Take 1 tablet (0.25 mg total) by mouth daily. 30 tablet 1  . traMADol (ULTRAM) 50 MG tablet Take 1-2 tablets (50-100 mg total) by mouth every 6 (six) hours as needed. (Patient taking differently: Take 50-100 mg by mouth every 6 (six) hours as needed for severe pain.) 60 tablet 0   No facility-administered medications prior to visit.     Radiology:   No results found. CT angio chest 03/26/2021: 1. No pulmonary embolus. 2. Patchy, ground-glass, and confluent opacities in the right greater than left lower lobe. There also subpleural opacities involving the anterior left greater than right upper lobes. Findings are suspicious for multifocal pneumonia. Recommend radiographic follow-up to resolution. 3. Prominent right greater than left hilar nodes and prominent subcarinal node, likely reactive. 4. Borderline cardiomegaly.  Coronary artery calcifications. Aortic Atherosclerosis (ICD10-I70.0).  Chest x-ray 03/29/2021: Persistent bibasilar opacities without substantial change. No significant pleural  effusion. No pneumothorax. Stable cardiomediastinal contours.  Cardiac Studies:   Echocardiogram 03/27/2021:  1. Left ventricular ejection fraction, by estimation, is 45 to 50%. The  left ventricle has mildly decreased function. The left ventricle  demonstrates global hypokinesis. The left ventricular internal cavity size  was mildly dilated. Left ventricular  diastolic parameters are consistent with Grade II diastolic dysfunction  (pseudonormalization). Elevated left atrial pressure.  2. Right ventricular systolic function is normal. The right ventricular  size is mildly enlarged. There is mildly elevated pulmonary artery  systolic pressure. The estimated right ventricular systolic pressure is  88.4 mmHg.  3. Left atrial size was mildly dilated.  4. The mitral valve is grossly normal. Trivial mitral valve  regurgitation. No evidence of mitral stenosis.  5. The aortic valve is tricuspid. There is moderate calcification of the  aortic valve. There is moderate thickening of the aortic valve. Aortic  valve regurgitation is mild to moderate. Moderate aortic valve stenosis.  Aortic valve area, by VTI measures  1.28 cm. Aortic valve mean gradient measures 23.3 mmHg. Aortic valve Vmax  measures 3.12 m/s.  6. The inferior vena cava is normal in size with greater than 50%  respiratory variability, suggesting right atrial pressure of 3 mmHg.  Upon personal review by Dr. Einar Gip felt LVEF to be 30-35% as opposed to 45-50%.    EKG:   EKG 04/11/2021: Sinus rhythm at a rate of 90 bpm.  Biatrial enlargement.  Normal axis.  LVH with secondary ST-T wave changes.  EKG 03/19/2021: Sinus tachycardia with single PVC at a rate of 106 bpm. Normal axis. Nonspecific T wave abnormality.  Assessment     ICD-10-CM   1. Acute on chronic combined systolic and diastolic CHF (congestive heart failure) (HCC)  I50.43 EKG 12-Lead  2. Chronic combined systolic and diastolic heart failure (HCC)  I50.42 Lipid  Panel With LDL/HDL Ratio  3. Severe aortic stenosis  I35.0 Lipid Panel With LDL/HDL Ratio     Medications Discontinued During This Encounter  Medication Reason  . traMADol (ULTRAM) 50 MG tablet Error  . digoxin (LANOXIN) 0.25 MG tablet Reorder  . carvedilol (COREG) 3.125 MG tablet Reorder    Meds ordered this encounter  Medications  . carvedilol (COREG) 3.125 MG tablet    Sig: Take 1 tablet (3.125 mg total) by  mouth 2 (two) times daily with a meal.    Dispense:  180 tablet    Refill:  3  . digoxin (LANOXIN) 0.25 MG tablet    Sig: Take 1 tablet (0.25 mg total) by mouth daily.    Dispense:  90 tablet    Refill:  3    Recommendations:   Thomas Frazier is a 55 y.o. 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  Patient denies significant cardiovascular risk factors.  No history of hypertension, hyperlipidemia, diabetes, no history of premature CAD. No known history of CAD.  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  Guideline directed medical therapy was initiated including carvedilol and Entresto, as well as digoxin and Lasix.  Patient responded well to diuresis.  Suspected severe aortic stenosis to be etiology of cardiomyopathy and has no significant troponin leak suspect this is chronic.  Patient now presents for follow up.  Patient is feeling much improved overall, without recurrence of dyspnea and no clinical signs of acute decompensated heart failure.  Blood pressure is soft, however patient remains asymptomatic.  He unfortunately is not on Entresto at this time due to high cost.  We will provide him samples for Entresto 24/26 mg twice daily and assist him in applying  for patient assistance program.  We will plan to repeat BMP in 1 week.  Advised patient regarding signs and symptoms of hypotension, he will notify the office immediately if he experiences any issues with reinitiation of Entresto.  We will also obtain lipid profile testing.  Patient heart rate remains >70 bpm, therefore will consider up titration of beta-blocker therapy versus addition of Corlanor at future visit.  We will also plan to obtain carotid artery duplex following next visit as patient has bilateral bruit, which may be radiation of aortic stenosis murmur.  Regard to severe aortic stenosis, will not pursue evaluation for surgical intervention at this time, rather recommend medical optimization at this time given heart failure.  Also patient continues to follow with oncology who has recommended referral to tertiary care center for consideration of stem cell transplant.  Follow-up in 2 weeks, sooner if needed, for heart failure and aortic stenosis.   [name], PA-C 04/11/2021, 4:53 PM Office: [phone]

## 2021-04-09 NOTE — Patient Instructions (Signed)
[facility] [facility]  Discharge Instructions: Thank you for choosing [facility] to provide your oncology and hematology care.   If you have a lab appointment with the [facility], please go directly to the [facility] and check in at the registration area.  Wear comfortable clothing and clothing appropriate for easy access to any Portacath or PICC line.   We strive to give you quality time with your provider. You may need to reschedule your appointment if you arrive late (15 or more minutes).  Arriving late affects you and other patients whose appointments are after yours.  Also, if you miss three or more appointments without notifying the office, you may be dismissed from the clinic at the provider's discretion.      For prescription refill requests, have your pharmacy contact our office and allow 72 hours for refills to be completed.    Today you received the following chemotherapy and/or immunotherapy agents azacytadine      To help prevent nausea and vomiting after your treatment, we encourage you to take your nausea medication as directed.  BELOW ARE SYMPTOMS THAT SHOULD BE REPORTED IMMEDIATELY: . *FEVER GREATER THAN 100.4 F (38 C) OR HIGHER . *CHILLS OR SWEATING . *NAUSEA AND VOMITING THAT IS NOT CONTROLLED WITH YOUR NAUSEA MEDICATION . *UNUSUAL SHORTNESS OF BREATH . *UNUSUAL BRUISING OR BLEEDING . *URINARY PROBLEMS (pain or burning when urinating, or frequent urination) . *BOWEL PROBLEMS (unusual diarrhea, constipation, pain near the anus) . TENDERNESS IN MOUTH AND THROAT WITH OR WITHOUT PRESENCE OF ULCERS (sore throat, sores in mouth, or a toothache) . UNUSUAL RASH, SWELLING OR PAIN  . UNUSUAL VAGINAL DISCHARGE OR ITCHING   Items with * indicate a potential emergency and should be followed up as soon as possible or go to the Emergency Department if any problems should occur.  Please show the CHEMOTHERAPY ALERT CARD or IMMUNOTHERAPY ALERT  CARD at check-in to the Emergency Department and triage nurse. Should you have questions after your visit or need to cancel or reschedule your appointment, please contact [facility]  [phone] and follow the prompts.  Office hours are 8:00 a.m. to 4:30 p.m. Monday - Friday. Please note that voicemails left after 4:00 p.m. may not be returned until the following business day.  We are closed weekends and major holidays. You have access to a nurse at all times for urgent questions. Please call the main number to the clinic [phone] and follow the prompts.  For any non-urgent questions, you may also contact your provider using MyChart. We now offer e-Visits for anyone 21 and older to request care online for non-urgent symptoms. For details visit [email or web address].   Also download the MyChart app! Go to the app store, search "MyChart", open the app, select [facility], and log in with your MyChart username and password.  Due to Covid, a mask is required upon entering the hospital/clinic. If you do not have a mask, one will be given to you upon arrival. For doctor visits, patients may have 1 support person aged 68 or older with them. For treatment visits, patients cannot have anyone with them due to current Covid guidelines and our immunocompromised population.

## 2021-04-10 ENCOUNTER — Inpatient Hospital Stay: Payer: Self-pay

## 2021-04-10 VITALS — BP 129/63 | HR 99 | Temp 98.3°F

## 2021-04-10 DIAGNOSIS — D46Z Other myelodysplastic syndromes: Secondary | ICD-10-CM

## 2021-04-10 MED ORDER — ONDANSETRON HCL 8 MG PO TABS: 8.0000 mg | ORAL_TABLET | Freq: Once | ORAL | Status: DC

## 2021-04-10 MED ORDER — AZACITIDINE CHEMO SQ INJECTION
75.0000 mg/m2 | Freq: Once | INTRAMUSCULAR | Status: AC
  Administered 2021-04-10: 142.5 mg via SUBCUTANEOUS
  Filled 2021-04-10: qty 5.7

## 2021-04-10 NOTE — Patient Instructions (Signed)
[facility]  Discharge Instructions: °Thank you for choosing [facility] to provide your oncology and hematology care.  ° °If you have a lab appointment with the Cancer Center, please go directly to the Cancer Center and check in at the registration area. ° °Wear comfortable clothing and clothing appropriate for easy access to any Portacath or PICC line.  ° °We strive to give you quality time with your provider. You may need to reschedule your appointment if you arrive late (15 or more minutes).  Arriving late affects you and other patients whose appointments are after yours.  Also, if you miss three or more appointments without notifying the office, you may be dismissed from the clinic at the provider’s discretion.    °  °For prescription refill requests, have your pharmacy contact our office and allow 72 hours for refills to be completed.   ° °Today you received the following chemotherapy and/or immunotherapy agents Vidaza °  °To help prevent nausea and vomiting after your treatment, we encourage you to take your nausea medication as directed. ° °BELOW ARE SYMPTOMS THAT SHOULD BE REPORTED IMMEDIATELY: °*FEVER GREATER THAN 100.4 F (38 °C) OR HIGHER °*CHILLS OR SWEATING °*NAUSEA AND VOMITING THAT IS NOT CONTROLLED WITH YOUR NAUSEA MEDICATION °*UNUSUAL SHORTNESS OF BREATH °*UNUSUAL BRUISING OR BLEEDING °*URINARY PROBLEMS (pain or burning when urinating, or frequent urination) °*BOWEL PROBLEMS (unusual diarrhea, constipation, pain near the anus) °TENDERNESS IN MOUTH AND THROAT WITH OR WITHOUT PRESENCE OF ULCERS (sore throat, sores in mouth, or a toothache) °UNUSUAL RASH, SWELLING OR PAIN  °UNUSUAL VAGINAL DISCHARGE OR ITCHING  ° °Items with * indicate a potential emergency and should be followed up as soon as possible or go to the Emergency Department if any problems should occur. ° °Please show the CHEMOTHERAPY ALERT CARD or IMMUNOTHERAPY ALERT CARD at check-in to the  Emergency Department and triage nurse. °Should you have questions after your visit or need to cancel or reschedule your appointment, please contact [facility]  [phone] and follow the prompts.  Office hours are 8:00 a.m. to 4:30 p.m. Monday - Friday. Please note that voicemails left after 4:00 p.m. may not be returned until the following business day.  We are closed weekends and major holidays. You have access to a nurse at all times for urgent questions. Please call the main number to the clinic [phone] and follow the prompts. ° °For any non-urgent questions, you may also contact your provider using MyChart. We now offer e-Visits for anyone 18 and older to request care online for non-urgent symptoms. For details visit [email or web address]. °  °Also download the MyChart app! Go to the app store, search "MyChart", open the app, select [facility], and log in with your MyChart username and password. ° °Due to Covid, a mask is required upon entering the hospital/clinic. If you do not have a mask, one will be given to you upon arrival. For doctor visits, patients may have 1 support person aged 18 or older with them. For treatment visits, patients cannot have anyone with them due to current Covid guidelines and our immunocompromised population.  °

## 2021-04-11 ENCOUNTER — Ambulatory Visit: Payer: Self-pay | Admitting: Student

## 2021-04-11 ENCOUNTER — Other Ambulatory Visit: Payer: Self-pay

## 2021-04-11 ENCOUNTER — Inpatient Hospital Stay: Payer: Self-pay

## 2021-04-11 ENCOUNTER — Encounter: Payer: Self-pay | Admitting: Student

## 2021-04-11 VITALS — BP 115/70 | HR 97 | Temp 98.2°F | Resp 16 | Ht 69.0 in | Wt 149.6 lb

## 2021-04-11 VITALS — BP 121/57 | HR 94 | Temp 97.9°F | Resp 16

## 2021-04-11 DIAGNOSIS — I350 Nonrheumatic aortic (valve) stenosis: Secondary | ICD-10-CM

## 2021-04-11 DIAGNOSIS — I5042 Chronic combined systolic (congestive) and diastolic (congestive) heart failure: Secondary | ICD-10-CM

## 2021-04-11 DIAGNOSIS — D46Z Other myelodysplastic syndromes: Secondary | ICD-10-CM

## 2021-04-11 DIAGNOSIS — I5043 Acute on chronic combined systolic (congestive) and diastolic (congestive) heart failure: Secondary | ICD-10-CM

## 2021-04-11 MED ORDER — ONDANSETRON HCL 8 MG PO TABS: 8.0000 mg | ORAL_TABLET | Freq: Once | ORAL | Status: DC

## 2021-04-11 MED ORDER — CARVEDILOL 3.125 MG PO TABS: 3.1250 mg | ORAL_TABLET | Freq: Two times a day (BID) | ORAL | 3 refills | Status: DC

## 2021-04-11 MED ORDER — AZACITIDINE CHEMO SQ INJECTION
75.0000 mg/m2 | Freq: Once | INTRAMUSCULAR | Status: AC
  Administered 2021-04-11: 142.5 mg via SUBCUTANEOUS
  Filled 2021-04-11: qty 5.7

## 2021-04-11 MED ORDER — ONDANSETRON HCL 8 MG PO TABS
ORAL_TABLET | ORAL | Status: AC
  Filled 2021-04-11: qty 1

## 2021-04-11 MED ORDER — DIGOXIN 250 MCG PO TABS: 0.2500 mg | ORAL_TABLET | Freq: Every day | ORAL | 3 refills | Status: AC

## 2021-04-11 NOTE — Patient Instructions (Signed)
[facility]  Discharge Instructions: °Thank you for choosing [facility] to provide your oncology and hematology care.  ° °If you have a lab appointment with the Cancer Center, please go directly to the Cancer Center and check in at the registration area. ° °Wear comfortable clothing and clothing appropriate for easy access to any Portacath or PICC line.  ° °We strive to give you quality time with your provider. You may need to reschedule your appointment if you arrive late (15 or more minutes).  Arriving late affects you and other patients whose appointments are after yours.  Also, if you miss three or more appointments without notifying the office, you may be dismissed from the clinic at the provider’s discretion.    °  °For prescription refill requests, have your pharmacy contact our office and allow 72 hours for refills to be completed.   ° °Today you received the following chemotherapy and/or immunotherapy agents Vidaza °  °To help prevent nausea and vomiting after your treatment, we encourage you to take your nausea medication as directed. ° °BELOW ARE SYMPTOMS THAT SHOULD BE REPORTED IMMEDIATELY: °*FEVER GREATER THAN 100.4 F (38 °C) OR HIGHER °*CHILLS OR SWEATING °*NAUSEA AND VOMITING THAT IS NOT CONTROLLED WITH YOUR NAUSEA MEDICATION °*UNUSUAL SHORTNESS OF BREATH °*UNUSUAL BRUISING OR BLEEDING °*URINARY PROBLEMS (pain or burning when urinating, or frequent urination) °*BOWEL PROBLEMS (unusual diarrhea, constipation, pain near the anus) °TENDERNESS IN MOUTH AND THROAT WITH OR WITHOUT PRESENCE OF ULCERS (sore throat, sores in mouth, or a toothache) °UNUSUAL RASH, SWELLING OR PAIN  °UNUSUAL VAGINAL DISCHARGE OR ITCHING  ° °Items with * indicate a potential emergency and should be followed up as soon as possible or go to the Emergency Department if any problems should occur. ° °Please show the CHEMOTHERAPY ALERT CARD or IMMUNOTHERAPY ALERT CARD at check-in to the  Emergency Department and triage nurse. °Should you have questions after your visit or need to cancel or reschedule your appointment, please contact [facility]  [phone] and follow the prompts.  Office hours are 8:00 a.m. to 4:30 p.m. Monday - Friday. Please note that voicemails left after 4:00 p.m. may not be returned until the following business day.  We are closed weekends and major holidays. You have access to a nurse at all times for urgent questions. Please call the main number to the clinic [phone] and follow the prompts. ° °For any non-urgent questions, you may also contact your provider using MyChart. We now offer e-Visits for anyone 18 and older to request care online for non-urgent symptoms. For details visit [email or web address]. °  °Also download the MyChart app! Go to the app store, search "MyChart", open the app, select [facility], and log in with your MyChart username and password. ° °Due to Covid, a mask is required upon entering the hospital/clinic. If you do not have a mask, one will be given to you upon arrival. For doctor visits, patients may have 1 support person aged 18 or older with them. For treatment visits, patients cannot have anyone with them due to current Covid guidelines and our immunocompromised population.  °

## 2021-04-16 ENCOUNTER — Telehealth: Payer: Self-pay | Admitting: *Deleted

## 2021-04-16 ENCOUNTER — Other Ambulatory Visit: Payer: Self-pay

## 2021-04-16 ENCOUNTER — Inpatient Hospital Stay: Payer: Self-pay

## 2021-04-16 ENCOUNTER — Telehealth: Payer: Self-pay

## 2021-04-16 DIAGNOSIS — D461 Refractory anemia with ring sideroblasts: Secondary | ICD-10-CM

## 2021-04-16 DIAGNOSIS — D46Z Other myelodysplastic syndromes: Secondary | ICD-10-CM

## 2021-04-16 DIAGNOSIS — D696 Thrombocytopenia, unspecified: Secondary | ICD-10-CM

## 2021-04-16 LAB — CBC WITH DIFFERENTIAL (CANCER CENTER ONLY)
Abs Immature Granulocytes: 0.01 10*3/uL (ref 0.00–0.07)
Basophils Absolute: 0 10*3/uL (ref 0.0–0.1)
Basophils Relative: 1 %
Eosinophils Absolute: 0 10*3/uL (ref 0.0–0.5)
Eosinophils Relative: 1 %
HCT: 19.5 % — ABNORMAL LOW (ref 39.0–52.0)
Hemoglobin: 6.3 g/dL — CL (ref 13.0–17.0)
Immature Granulocytes: 1 %
Lymphocytes Relative: 77 %
Lymphs Abs: 1.7 10*3/uL (ref 0.7–4.0)
MCH: 26.9 pg (ref 26.0–34.0)
MCHC: 32.3 g/dL (ref 30.0–36.0)
MCV: 83.3 fL (ref 80.0–100.0)
Monocytes Absolute: 0.1 10*3/uL (ref 0.1–1.0)
Monocytes Relative: 7 %
Neutro Abs: 0.3 10*3/uL — CL (ref 1.7–7.7)
Neutrophils Relative %: 13 %
Platelet Count: 23 10*3/uL — ABNORMAL LOW (ref 150–400)
RBC: 2.34 MIL/uL — ABNORMAL LOW (ref 4.22–5.81)
RDW: 26.2 % — ABNORMAL HIGH (ref 11.5–15.5)
WBC Count: 2.1 10*3/uL — ABNORMAL LOW (ref 4.0–10.5)
nRBC: 11.3 % — ABNORMAL HIGH (ref 0.0–0.2)

## 2021-04-16 LAB — BASIC METABOLIC PANEL - CANCER CENTER ONLY
Anion gap: 5 (ref 5–15)
BUN: 24 mg/dL — ABNORMAL HIGH (ref 6–20)
CO2: 31 mmol/L (ref 22–32)
Calcium: 10 mg/dL (ref 8.9–10.3)
Chloride: 101 mmol/L (ref 98–111)
Creatinine: 0.75 mg/dL (ref 0.61–1.24)
GFR, Estimated: >60 mL/min (ref >60)
Glucose, Bld: 110 mg/dL — ABNORMAL HIGH (ref 70–99)
Potassium: 4.4 mmol/L (ref 3.5–5.1)
Sodium: 137 mmol/L (ref 135–145)

## 2021-04-16 NOTE — Telephone Encounter (Signed)
S/w pt per sch message and moved pts lab from 5/25 to 5/23, pt is aware to wait for results   Thomas Frazier

## 2021-04-16 NOTE — Telephone Encounter (Signed)
Dr. Marin Olp notified of HGB-6.3, platelet count-23 and ANC-0.3.  Orders received for pt to come back on Monday, 04/21/21 for labs only.  Message sent to scheduling.

## 2021-04-21 ENCOUNTER — Inpatient Hospital Stay: Payer: Self-pay

## 2021-04-21 ENCOUNTER — Telehealth: Payer: Self-pay | Admitting: *Deleted

## 2021-04-21 ENCOUNTER — Other Ambulatory Visit: Payer: Self-pay | Admitting: *Deleted

## 2021-04-21 ENCOUNTER — Other Ambulatory Visit: Payer: Self-pay | Admitting: Family

## 2021-04-21 ENCOUNTER — Other Ambulatory Visit: Payer: Self-pay

## 2021-04-21 DIAGNOSIS — D649 Anemia, unspecified: Secondary | ICD-10-CM

## 2021-04-21 DIAGNOSIS — D46Z Other myelodysplastic syndromes: Secondary | ICD-10-CM

## 2021-04-21 LAB — CBC WITH DIFFERENTIAL (CANCER CENTER ONLY)
Abs Immature Granulocytes: 0.01 10*3/uL (ref 0.00–0.07)
Basophils Absolute: 0 10*3/uL (ref 0.0–0.1)
Basophils Relative: 0 %
Eosinophils Absolute: 0 10*3/uL (ref 0.0–0.5)
Eosinophils Relative: 0 %
HCT: 17.9 % — ABNORMAL LOW (ref 39.0–52.0)
Hemoglobin: 5.8 g/dL — CL (ref 13.0–17.0)
Immature Granulocytes: 0 %
Lymphocytes Relative: 74 %
Lymphs Abs: 1.9 10*3/uL (ref 0.7–4.0)
MCH: 27.4 pg (ref 26.0–34.0)
MCHC: 32.4 g/dL (ref 30.0–36.0)
MCV: 84.4 fL (ref 80.0–100.0)
Monocytes Absolute: 0.2 10*3/uL (ref 0.1–1.0)
Monocytes Relative: 8 %
Neutro Abs: 0.5 10*3/uL — ABNORMAL LOW (ref 1.7–7.7)
Neutrophils Relative %: 18 %
Platelet Count: 23 10*3/uL — ABNORMAL LOW (ref 150–400)
RBC: 2.12 MIL/uL — ABNORMAL LOW (ref 4.22–5.81)
RDW: 26.1 % — ABNORMAL HIGH (ref 11.5–15.5)
WBC Count: 2.7 10*3/uL — ABNORMAL LOW (ref 4.0–10.5)
nRBC: 24.3 % — ABNORMAL HIGH (ref 0.0–0.2)

## 2021-04-21 LAB — BASIC METABOLIC PANEL - CANCER CENTER ONLY
Anion gap: 6 (ref 5–15)
BUN: 28 mg/dL — ABNORMAL HIGH (ref 6–20)
CO2: 29 mmol/L (ref 22–32)
Calcium: 10.3 mg/dL (ref 8.9–10.3)
Chloride: 99 mmol/L (ref 98–111)
Creatinine: 0.83 mg/dL (ref 0.61–1.24)
GFR, Estimated: >60 mL/min (ref >60)
Glucose, Bld: 110 mg/dL — ABNORMAL HIGH (ref 70–99)
Potassium: 4.7 mmol/L (ref 3.5–5.1)
Sodium: 134 mmol/L — ABNORMAL LOW (ref 135–145)

## 2021-04-21 LAB — SAMPLE TO BLOOD BANK

## 2021-04-21 LAB — PREPARE RBC (CROSSMATCH)

## 2021-04-21 NOTE — Telephone Encounter (Signed)
Patient came in today for labs and not feeling well. Hemoglobin is 5.8. Per Dr. Marin Olp, give one unit of blood tomorrow. Orders received. Patient typed and crossmatched. He verbalized understanding of appointments. New schedule given to him.

## 2021-04-21 NOTE — Progress Notes (Signed)
Day 1 of azacitidine orders moved from 6/6 to 5/31 per Dr. Antonieta Pert instructions.

## 2021-04-22 ENCOUNTER — Other Ambulatory Visit: Payer: Self-pay

## 2021-04-22 ENCOUNTER — Inpatient Hospital Stay: Payer: Self-pay

## 2021-04-22 DIAGNOSIS — D649 Anemia, unspecified: Secondary | ICD-10-CM

## 2021-04-22 DIAGNOSIS — D46Z Other myelodysplastic syndromes: Secondary | ICD-10-CM

## 2021-04-22 DIAGNOSIS — M898X9 Other specified disorders of bone, unspecified site: Secondary | ICD-10-CM

## 2021-04-22 MED ORDER — HEPARIN SOD (PORK) LOCK FLUSH 100 UNIT/ML IV SOLN
500.0000 [IU] | Freq: Every day | INTRAVENOUS | Status: DC | PRN
Start: 2021-04-22 — End: 2021-04-22
  Filled 2021-04-22: qty 5

## 2021-04-22 MED ORDER — DIPHENHYDRAMINE HCL 25 MG PO CAPS
25.0000 mg | ORAL_CAPSULE | Freq: Once | ORAL | Status: AC
  Administered 2021-04-22: 25 mg via ORAL

## 2021-04-22 MED ORDER — ACETAMINOPHEN 325 MG PO TABS
ORAL_TABLET | ORAL | Status: AC
  Filled 2021-04-22: qty 2

## 2021-04-22 MED ORDER — SODIUM CHLORIDE 0.9% IV SOLUTION
250.0000 mL | Freq: Once | INTRAVENOUS | Status: AC
  Administered 2021-04-22: 250 mL via INTRAVENOUS
  Filled 2021-04-22: qty 250

## 2021-04-22 MED ORDER — TRAMADOL HCL 50 MG PO TABS: 100.0000 mg | ORAL_TABLET | Freq: Four times a day (QID) | ORAL | 0 refills | Status: DC | PRN

## 2021-04-22 MED ORDER — ACETAMINOPHEN 325 MG PO TABS
650.0000 mg | ORAL_TABLET | Freq: Once | ORAL | Status: AC
Start: 2021-04-22 — End: 2021-04-22
  Administered 2021-04-22: 650 mg via ORAL

## 2021-04-23 ENCOUNTER — Inpatient Hospital Stay: Payer: Self-pay

## 2021-04-23 LAB — TYPE AND SCREEN
ABO/RH(D): O POS
Antibody Screen: NEGATIVE
Unit division: 0

## 2021-04-23 LAB — BPAM RBC
Blood Product Expiration Date: 202206232359
ISSUE DATE / TIME: 202205240759
Unit Type and Rh: 5100

## 2021-04-24 NOTE — Progress Notes (Signed)
Primary Physician/Referring:  Cathleen Corti, PA-C  Patient ID: Ardean Melroy, male    DOB: 1966/02/07, 55 y.o.   MRN: [id number]  Chief Complaint  Patient presents with  . Acute on chronic combined systolic and diastolic    HPI:    Wilberto Console  is a 55 y.o.  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  Patient denies significant cardiovascular risk factors.  No history of hypertension, hyperlipidemia, diabetes, no history of premature CAD. No known history of CAD.  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  Guideline directed medical therapy was initiated including carvedilol and Entresto, as well as digoxin and Lasix.  Patient responded well to diuresis.  Suspect severe aortic stenosis to be etiology of cardiomyopathy and has no significant troponin leak suspect this is chronic.  Patient presents for 2 week follow up of heart failure and aortic stenosis. At last visit restarted Entresto as he had not been on it since the hospital given high cost.  BUN trended up slightly from 24-28, otherwise BMP stable.  Patient has also had episodes of dizziness since initiation of Entresto.  His blood pressure remains soft. Denies chest pain, palpitations, dizziness, syncope, near syncope.  Denies orthopnea, PND, leg swelling.  Patient reports shortness of breath has essentially resolved since discharge.  He has notably been referred to [facility] oncology for evaluation of bone marrow transplant.  Past Medical History:  Diagnosis Date  . Acute diastolic CHF (congestive heart failure) ([place]) 03/28/2021  . Aortic atherosclerosis ([place]) 03/27/2021  . 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 APPENDECTOMY     Family History  Problem Relation Age of Onset  . Hiatal hernia Mother        Erupted inside her body  . Healthy Sister   . Healthy Brother   . Healthy Sister   . Hyperlipidemia Brother   . Hyperlipidemia Brother     Social History   Tobacco Use  . Smoking status: Never Smoker  . Smokeless tobacco: Never Used  Substance Use Topics  . Alcohol use: Not Currently   Marital Status: Married   ROS  Review of Systems  Constitutional: Negative for malaise/fatigue and weight gain.  Cardiovascular: Negative for chest pain, claudication, leg swelling, near-syncope, orthopnea, palpitations, paroxysmal nocturnal dyspnea and syncope.  Respiratory: Negative for shortness of breath.   Hematologic/Lymphatic: Does not bruise/bleed easily.  Gastrointestinal: Negative for melena.  Neurological: Positive for dizziness. Negative for weakness.    Objective  Blood pressure (!) 98/53, pulse 97, temperature 98.7 F (37.1 C), height 5\' 9"  (1.753 m), weight 158 lb (71.7 kg), SpO2 98 %.  Vitals with BMI 04/25/2021 04/22/2021 04/22/2021  Height 5\' 9"  - -  Weight 158 lbs - -  BMI 76.80 - -  Systolic 98 881 103  Diastolic 53 63 59  Pulse 97 94 103      Physical Exam Vitals reviewed.  Constitutional:      Comments: Appears older than stated age.  HENT:     Head: Normocephalic and atraumatic.  Cardiovascular:     Rate and Rhythm: Normal rate and regular rhythm.     Pulses: Intact distal pulses.  Carotid pulses are on the right side with bruit and on the left side with bruit.    Heart sounds: S1 normal and S2 normal. Murmur heard.   Harsh midsystolic murmur is present with a grade of 3/6 at the upper right sternal border radiating to the neck. No gallop.   Pulmonary:     Effort: Pulmonary effort is normal. No respiratory distress.     Breath sounds: No wheezing, rhonchi or rales.   Musculoskeletal:     Right lower leg: No edema.     Left lower leg: No edema.  Skin:    General: Skin is warm and dry.  Neurological:     Mental Status: He is alert.     Laboratory examination:   Recent Labs    04/07/21 0819 04/16/21 1003 04/21/21 1103  NA 132* 137 134*  K 4.0 4.4 4.7  CL 97* 101 99  CO2 31 31 29   GLUCOSE 99 110* 110*  BUN 25* 24* 28*  CREATININE 0.75 0.75 0.83  CALCIUM 10.1 10.0 10.3  GFRNONAA >60 >60 >60   estimated creatinine clearance is 99.4 mL/min (by C-G formula based on SCr of 0.83 mg/dL).  CMP Latest Ref Rng & Units 04/21/2021 04/16/2021 04/07/2021  Glucose 70 - 99 mg/dL 110(H) 110(H) 99  BUN 6 - 20 mg/dL 28(H) 24(H) 25(H)  Creatinine 0.61 - 1.24 mg/dL 0.83 0.75 0.75  Sodium 135 - 145 mmol/L 134(L) 137 132(L)  Potassium 3.5 - 5.1 mmol/L 4.7 4.4 4.0  Chloride 98 - 111 mmol/L 99 101 97(L)  CO2 22 - 32 mmol/L 29 31 31   Calcium 8.9 - 10.3 mg/dL 10.3 10.0 10.1  Total Protein 6.5 - 8.1 g/dL - - -  Total Bilirubin 0.3 - 1.2 mg/dL - - -  Alkaline Phos 38 - 126 U/L - - -  AST 15 - 41 U/L - - -  ALT 0 - 44 U/L - - -   CBC Latest Ref Rng & Units 04/21/2021 04/16/2021 04/07/2021  WBC 4.0 - 10.5 K/uL 2.7(L) 2.1(L) 2.5(L)  Hemoglobin 13.0 - 17.0 g/dL 5.8(LL) 6.3(LL) 7.8(L)  Hematocrit 39.0 - 52.0 % 17.9(L) 19.5(L) 24.8(L)  Platelets 150 - 400 K/uL 23(L) 23(L) 52(L)    Lipid Panel No results for input(s): CHOL, TRIG, LDLCALC, VLDL, HDL, CHOLHDL, LDLDIRECT in the last 8760 hours.  HEMOGLOBIN A1C No results found for: HGBA1C, MPG TSH No results for input(s): TSH in the last 8760 hours.  External labs:   None   Medications and allergies   Allergies  Allergen Reactions  . Ciprofloxacin Rash  . Diflucan [Fluconazole] Rash  . Famvir [Famciclovir] Rash     Outpatient Medications Prior to Visit  Medication Sig Dispense Refill  . azaCITIDine (VIDAZA) 100 MG SUSR Vidaza 142.5 mg (rounded from 143.25 mg)=75 mg/m2 x 1.91 m2 daily times five days every  twenty eight days (Patient taking differently: Inject 100 mg into the vein See admin instructions. Vidaza 142.5 mg (rounded from 143.25 mg)=75 mg/m2 x 1.91 m2 daily times five days every twenty eight days) 1 each 0  . digoxin (LANOXIN) 0.25 MG tablet Take 1 tablet (0.25 mg total) by mouth daily. 90 tablet 3  . Multiple Vitamins-Iron (MULTIVITAMIN/IRON PO) Take 1 tablet by mouth daily.    . prochlorperazine (COMPAZINE) 10 MG tablet Take 1 tablet (10 mg total) by mouth every 6 (six) hours as needed (Nausea or vomiting). (Patient taking differently: Take 10 mg by mouth every 6 (six) hours as needed for nausea or  vomiting (Nausea or vomiting).) 30 tablet 1  . traMADol (ULTRAM) 50 MG tablet Take 2 tablets (100 mg total) by mouth every 6 (six) hours as needed. 1-2 tablets (50mg - 100mg ) by mouth every 6 hours as needed 60 tablet 0  . carvedilol (COREG) 3.125 MG tablet Take 1 tablet (3.125 mg total) by mouth 2 (two) times daily with a meal. 180 tablet 3  . sacubitril-valsartan (ENTRESTO) 24-26 MG Take 1 tablet by mouth 2 (two) times daily. 60 tablet 1   No facility-administered medications prior to visit.     Radiology:   No results found. CT angio chest 03/26/2021: 1. No pulmonary embolus. 2. Patchy, ground-glass, and confluent opacities in the right greater than left lower lobe. There also subpleural opacities involving the anterior left greater than right upper lobes. Findings are suspicious for multifocal pneumonia. Recommend radiographic follow-up to resolution. 3. Prominent right greater than left hilar nodes and prominent subcarinal node, likely reactive. 4. Borderline cardiomegaly.  Coronary artery calcifications. Aortic Atherosclerosis (ICD10-I70.0).  Chest x-ray 03/29/2021: Persistent bibasilar opacities without substantial change. No significant pleural effusion. No pneumothorax. Stable cardiomediastinal contours.  Cardiac Studies:   Echocardiogram 03/27/2021:  1. Left ventricular  ejection fraction, by estimation, is 45 to 50%. The  left ventricle has mildly decreased function. The left ventricle  demonstrates global hypokinesis. The left ventricular internal cavity size  was mildly dilated. Left ventricular  diastolic parameters are consistent with Grade II diastolic dysfunction  (pseudonormalization). Elevated left atrial pressure.  2. Right ventricular systolic function is normal. The right ventricular  size is mildly enlarged. There is mildly elevated pulmonary artery  systolic pressure. The estimated right ventricular systolic pressure is  56.2 mmHg.  3. Left atrial size was mildly dilated.  4. The mitral valve is grossly normal. Trivial mitral valve  regurgitation. No evidence of mitral stenosis.  5. The aortic valve is tricuspid. There is moderate calcification of the  aortic valve. There is moderate thickening of the aortic valve. Aortic  valve regurgitation is mild to moderate. Moderate aortic valve stenosis.  Aortic valve area, by VTI measures  1.28 cm. Aortic valve mean gradient measures 23.3 mmHg. Aortic valve Vmax  measures 3.12 m/s.  6. The inferior vena cava is normal in size with greater than 50%  respiratory variability, suggesting right atrial pressure of 3 mmHg.  Upon personal review by Dr. Einar Gip felt LVEF to be 30-35% as opposed to 45-50%.    EKG:   EKG 04/11/2021: Sinus rhythm at a rate of 90 bpm.  Biatrial enlargement.  Normal axis.  LVH with secondary ST-T wave changes.  EKG 03/19/2021: Sinus tachycardia with single PVC at a rate of 106 bpm. Normal axis. Nonspecific T wave abnormality.  Assessment     ICD-10-CM   1. Chronic combined systolic and diastolic heart failure (HCC)  I50.42 EKG 12-Lead    Digoxin level  2. Severe aortic stenosis  I35.0      Medications Discontinued During This Encounter  Medication Reason  . carvedilol (COREG) 3.125 MG tablet Reorder  . sacubitril-valsartan (ENTRESTO) 24-26 MG     Meds ordered  this encounter  Medications  . carvedilol (COREG) 3.125 MG tablet    Sig: Take 1 tablet (3.125 mg total) by mouth 2 (two) times daily with a meal.    Dispense:  180 tablet    Refill:  3  . sacubitril-valsartan (ENTRESTO) 24-26 MG    Sig: Take 0.5 tablets by mouth 2 (two) times daily.    Dispense:  60 tablet    Refill:  1    Recommendations:   Gunnison Chahal is a 55 y.o. 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  Patient denies significant cardiovascular risk factors.  No history of hypertension, hyperlipidemia, diabetes, no history of premature CAD. No known history of CAD.  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  Guideline directed medical therapy was initiated including carvedilol and Entresto, as well as digoxin and Lasix.  Patient responded well to diuresis.  Suspected severe aortic stenosis to be etiology of cardiomyopathy and has no significant troponin leak suspect this is chronic.  Patient presents for 2 week follow up of heart failure and aortic stenosis. At last visit restarted Entresto as he had not been on it since the hospital given high cost.  BUN trended up slightly from 24-28, otherwise BMP stable.  Patient has had episodes of dizziness since initiation of Entresto given his low blood pressure will reduce Entresto.  Patient will now take Entresto 24/26 mg half tablet twice daily.  Patient is relatively asymptomatic and feeling much improved since discharge from the hospital.  As he is relatively stable from a cardiovascular standpoint will not make further changes to his medications at this time.  We will obtain digoxin level.  Patient's heart rate  remains >70 bpm and he is presently on maximum tolerated beta-blocker therapy.  We will consider addition of Corlanor at next visit.  Could also consider obtaining carotid artery duplex, however suspect bilateral carotid bruit to be radiation of aortic stenosis murmur.  In regard to severe aortic stenosis, will not pursue evaluation for surgical intervention at this time given heart failure and oncology prognosis would pursue medical optimization at this time.  Follow-up in 4 weeks, sooner if needed, for heart failure and aortic stenosis.   [name], PA-C 04/28/2021, 6:18 PM Office: [phone]

## 2021-04-25 ENCOUNTER — Other Ambulatory Visit: Payer: Self-pay

## 2021-04-25 ENCOUNTER — Encounter: Payer: Self-pay | Admitting: Student

## 2021-04-25 ENCOUNTER — Ambulatory Visit: Payer: Self-pay | Admitting: Student

## 2021-04-25 VITALS — BP 98/53 | HR 97 | Temp 98.7°F | Ht 69.0 in | Wt 158.0 lb

## 2021-04-25 DIAGNOSIS — I5042 Chronic combined systolic (congestive) and diastolic (congestive) heart failure: Secondary | ICD-10-CM

## 2021-04-25 DIAGNOSIS — I350 Nonrheumatic aortic (valve) stenosis: Secondary | ICD-10-CM

## 2021-04-25 MED ORDER — SACUBITRIL-VALSARTAN 24-26 MG PO TABS: 0.5000 | ORAL_TABLET | Freq: Two times a day (BID) | ORAL | 1 refills | Status: DC

## 2021-04-25 MED ORDER — CARVEDILOL 3.125 MG PO TABS: 3.1250 mg | ORAL_TABLET | Freq: Two times a day (BID) | ORAL | 3 refills | Status: AC

## 2021-04-26 ENCOUNTER — Encounter: Payer: Self-pay | Admitting: Hematology & Oncology

## 2021-04-29 ENCOUNTER — Other Ambulatory Visit: Payer: Self-pay

## 2021-04-29 ENCOUNTER — Encounter: Payer: Self-pay | Admitting: Hematology & Oncology

## 2021-04-29 ENCOUNTER — Inpatient Hospital Stay: Payer: Self-pay

## 2021-04-29 ENCOUNTER — Inpatient Hospital Stay (HOSPITAL_BASED_OUTPATIENT_CLINIC_OR_DEPARTMENT_OTHER): Payer: Self-pay | Admitting: Hematology & Oncology

## 2021-04-29 ENCOUNTER — Telehealth: Payer: Self-pay | Admitting: *Deleted

## 2021-04-29 ENCOUNTER — Telehealth: Payer: Self-pay

## 2021-04-29 VITALS — BP 99/51 | HR 101 | Temp 98.7°F | Resp 20 | Wt 160.0 lb

## 2021-04-29 DIAGNOSIS — D46Z Other myelodysplastic syndromes: Secondary | ICD-10-CM

## 2021-04-29 LAB — CMP (CANCER CENTER ONLY)
ALT: 15 U/L (ref 0–44)
AST: 16 U/L (ref 15–41)
Albumin: 3.3 g/dL — ABNORMAL LOW (ref 3.5–5.0)
Alkaline Phosphatase: 79 U/L (ref 38–126)
Anion gap: 7 (ref 5–15)
BUN: 24 mg/dL — ABNORMAL HIGH (ref 6–20)
CO2: 26 mmol/L (ref 22–32)
Calcium: 9.4 mg/dL (ref 8.9–10.3)
Chloride: 97 mmol/L — ABNORMAL LOW (ref 98–111)
Creatinine: 0.76 mg/dL (ref 0.61–1.24)
GFR, Estimated: >60 mL/min (ref >60)
Glucose, Bld: 136 mg/dL — ABNORMAL HIGH (ref 70–99)
Potassium: 4.6 mmol/L (ref 3.5–5.1)
Sodium: 130 mmol/L — ABNORMAL LOW (ref 135–145)
Total Bilirubin: 0.7 mg/dL (ref 0.3–1.2)
Total Protein: 7.2 g/dL (ref 6.5–8.1)

## 2021-04-29 LAB — CBC WITH DIFFERENTIAL (CANCER CENTER ONLY)
Abs Immature Granulocytes: 0 10*3/uL (ref 0.00–0.07)
Basophils Absolute: 0 10*3/uL (ref 0.0–0.1)
Basophils Relative: 0 %
Eosinophils Absolute: 0 10*3/uL (ref 0.0–0.5)
Eosinophils Relative: 0 %
HCT: 17.7 % — ABNORMAL LOW (ref 39.0–52.0)
Hemoglobin: 5.7 g/dL — CL (ref 13.0–17.0)
Lymphocytes Relative: 88 %
Lymphs Abs: 1.8 10*3/uL (ref 0.7–4.0)
MCH: 27.4 pg (ref 26.0–34.0)
MCHC: 32.2 g/dL (ref 30.0–36.0)
MCV: 85.1 fL (ref 80.0–100.0)
Monocytes Absolute: 0.2 10*3/uL (ref 0.1–1.0)
Monocytes Relative: 10 %
Neutro Abs: 0 10*3/uL — CL (ref 1.7–7.7)
Neutrophils Relative %: 2 %
Platelet Count: 74 10*3/uL — ABNORMAL LOW (ref 150–400)
RBC: 2.08 MIL/uL — ABNORMAL LOW (ref 4.22–5.81)
RDW: 26.5 % — ABNORMAL HIGH (ref 11.5–15.5)
WBC Count: 2.1 10*3/uL — ABNORMAL LOW (ref 4.0–10.5)
nRBC: 100.9 % — ABNORMAL HIGH (ref 0.0–0.2)

## 2021-04-29 LAB — SAMPLE TO BLOOD BANK

## 2021-04-29 LAB — RETICULOCYTES
Immature Retic Fract: 17.5 % — ABNORMAL HIGH (ref 2.3–15.9)
RBC.: 2.15 MIL/uL — ABNORMAL LOW (ref 4.22–5.81)
Retic Count, Absolute: 111.5 10*3/uL (ref 19.0–186.0)
Retic Ct Pct: 5.2 % — ABNORMAL HIGH (ref 0.4–3.1)

## 2021-04-29 LAB — SAVE SMEAR(SSMR), FOR PROVIDER SLIDE REVIEW

## 2021-04-29 LAB — PREPARE RBC (CROSSMATCH)

## 2021-04-29 MED ORDER — MEGESTROL ACETATE 40 MG/ML PO SUSP
400.0000 mg | Freq: Two times a day (BID) | ORAL | 4 refills | Status: DC
Start: 2021-04-29 — End: 2021-05-27

## 2021-04-29 NOTE — Telephone Encounter (Signed)
6/7 appts made per 04/29/21 los, pt to gain sch in mychart and at 04/30/21 appt   [name]

## 2021-04-29 NOTE — Telephone Encounter (Signed)
Dr. Marin Olp notified of HGB-5.7 and ANC-0.2.  Orders received for pt to get 2 units of PRBC's tomorrow per Dr. Marin Olp.

## 2021-04-29 NOTE — Progress Notes (Signed)
Hematology and Oncology Follow Up Visit  Thomas Frazier [id number] Jun 04, 1966 55 y.o. 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  S/p cycle #2 -- start on 02/17/2021     Interim History:  Thomas Frazier is back for follow-up.  Overall, he is really about the same.  He comes in a wheelchair.  He is quite tired.  I am sure this is because he is quite anemic.  He has swelling in his feet.  This is probably from the anemia and also to some degree from his cardiac valvular disease.  I think he has significant aortic stenosis.  He did make it out to [facility].  He saw Dr. Mariea Clonts out there.  They do agree that he would be a candidate for allogeneic stem cell transplant.  I need to try to find a donor for him.  They agreed with Vidaza for right now.  He clearly needs to be transfused.  I think a lot of his symptoms are because of anemia.  He has had no bleeding.  Has had some loose stool but no frank diarrhea.  His skin is looking a lot better.  He has had no fever.  He has had a decreased appetite.  His weight is up a little bit which could be from fluid.  He has had a little bit of cough.  It is nonproductive.  Overall, I would have to say his performance status is by ECOG 2.    Medications:  Current Outpatient Medications:  .  azaCITIDine (VIDAZA) 100 MG SUSR, Vidaza 142.5 mg (rounded from 143.25 mg)=75 mg/m2 x 1.91 m2 daily times five days every twenty eight days (Patient taking differently: Inject 100 mg into the vein See admin instructions. Vidaza 142.5 mg (rounded from 143.25 mg)=75 mg/m2 x 1.91 m2 daily times five days every twenty eight days), Disp: 1 each, Rfl: 0 .  carvedilol (COREG) 3.125 MG tablet, Take 1 tablet (3.125 mg total) by mouth 2 (two) times daily with a meal., Disp: 180 tablet, Rfl: 3 .  digoxin (LANOXIN) 0.25 MG  tablet, Take 1 tablet (0.25 mg total) by mouth daily., Disp: 90 tablet, Rfl: 3 .  Multiple Vitamins-Iron (MULTIVITAMIN/IRON PO), Take 1 tablet by mouth daily., Disp: , Rfl:  .  prochlorperazine (COMPAZINE) 10 MG tablet, Take 1 tablet (10 mg total) by mouth every 6 (six) hours as needed (Nausea or vomiting). (Patient taking differently: Take 10 mg by mouth every 6 (six) hours as needed for nausea or vomiting (Nausea or vomiting).), Disp: 30 tablet, Rfl: 1 .  sacubitril-valsartan (ENTRESTO) 24-26 MG, Take 0.5 tablets by mouth 2 (two) times daily., Disp: 60 tablet, Rfl: 1 .  traMADol (ULTRAM) 50 MG tablet, Take 2 tablets (100 mg total) by mouth every 6 (six) hours as needed. 1-2 tablets (50mg - 100mg ) by mouth every 6 hours as needed, Disp: 60 tablet, Rfl: 0  Allergies:  Allergies  Allergen Reactions  . Ciprofloxacin Rash  . Diflucan [Fluconazole] Rash  . Famvir [Famciclovir] Rash    Past Medical History, Surgical history, Social history, and Family History were reviewed and updated.  Review of Systems: Review of Systems  HENT:   Positive for nosebleeds.   Eyes: Negative.   Respiratory: Positive for chest tightness.   Cardiovascular: Negative.   Endocrine: Negative.   Genitourinary: Negative.    Musculoskeletal:  Positive for arthralgias, back pain and myalgias.  Skin: Negative.   Neurological: Negative.   Hematological: Negative.   Psychiatric/Behavioral: Negative.     Physical Exam:  vitals were not taken for this visit.   Wt Readings from Last 3 Encounters:  04/25/21 158 lb (71.7 kg)  04/11/21 149 lb 9.6 oz (67.9 kg)  04/07/21 152 lb (68.9 kg)    Physical Exam Vitals reviewed.  HENT:     Head: Normocephalic and atraumatic.  Eyes:     Pupils: Pupils are equal, round, and reactive to light.  Cardiovascular:     Rate and Rhythm: Normal rate and regular rhythm.     Heart sounds: Normal heart sounds.  Pulmonary:     Effort: Pulmonary effort is normal.     Breath sounds:  Normal breath sounds.  Abdominal:     General: Bowel sounds are normal.     Palpations: Abdomen is soft.  Musculoskeletal:        General: No tenderness or deformity. Normal range of motion.     Cervical back: Normal range of motion.  Lymphadenopathy:     Cervical: No cervical adenopathy.  Skin:    Findings: No erythema or rash.     Comments: On his skin, he has  maculopapule lesions.  These have a central eschar.  They are somewhat tender.  There is no exudate from these.  They are on his face, arms, neck, upper chest and legs.  Neurological:     Mental Status: He is alert and oriented to person, place, and time.  Psychiatric:        Behavior: Behavior normal.        Thought Content: Thought content normal.        Judgment: Judgment normal.      Lab Results  Component Value Date   WBC 2.1 (L) 04/29/2021   HGB 5.7 (LL) 04/29/2021   HCT 17.7 (L) 04/29/2021   MCV 85.1 04/29/2021   PLT 74 (L) 04/29/2021     Chemistry      Component Value Date/Time   NA 134 (L) 04/21/2021 1103   K 4.7 04/21/2021 1103   CL 99 04/21/2021 1103   CO2 29 04/21/2021 1103   BUN 28 (H) 04/21/2021 1103   CREATININE 0.83 04/21/2021 1103      Component Value Date/Time   CALCIUM 10.3 04/21/2021 1103   ALKPHOS 80 03/31/2021 0356   AST 49 (H) 03/31/2021 0356   AST 21 03/26/2021 1051   ALT 63 (H) 03/31/2021 0356   ALT 32 03/26/2021 1051   BILITOT 0.7 03/31/2021 0356   BILITOT 1.1 03/26/2021 1051      Impression and Plan: Thomas Frazier is a very nice 55 year old Hispanic male.  He has high-grade myelodysplasia.  This is evidenced by the complex cytogenetics that he has.  He actually is not due for his next cycle of Vidaza until next week.  We will transfuse him tomorrow.  I am impressed that his platelet count is gone better.  Hopefully this might be a sign that something might be working.  I just wish that his anemia would improve.  We will transfuse him tomorrow.  We will then plan to get him  in next week for his third cycle of treatment.  Hopefully, Thomas Frazier will be able to find a donor for him.  I know they are looking into his family to see if there might be a possible donor with his family.   Thomas Napoleon, MD  5/31/20222:01 PM

## 2021-04-30 ENCOUNTER — Inpatient Hospital Stay: Payer: Self-pay | Attending: Family

## 2021-04-30 ENCOUNTER — Inpatient Hospital Stay: Payer: Self-pay

## 2021-04-30 DIAGNOSIS — D46Z Other myelodysplastic syndromes: Secondary | ICD-10-CM

## 2021-04-30 DIAGNOSIS — D461 Refractory anemia with ring sideroblasts: Secondary | ICD-10-CM | POA: Insufficient documentation

## 2021-04-30 DIAGNOSIS — Z5111 Encounter for antineoplastic chemotherapy: Secondary | ICD-10-CM | POA: Insufficient documentation

## 2021-04-30 LAB — IRON AND TIBC
Iron: 47 ug/dL (ref 42–163)
Saturation Ratios: 24 % (ref 20–55)
TIBC: 195 ug/dL — ABNORMAL LOW (ref 202–409)
UIBC: 148 ug/dL (ref 117–376)

## 2021-04-30 LAB — FERRITIN: Ferritin: 1290 ng/mL — ABNORMAL HIGH (ref 24–336)

## 2021-04-30 MED ORDER — ACETAMINOPHEN 325 MG PO TABS
ORAL_TABLET | ORAL | Status: AC
  Filled 2021-04-30: qty 2

## 2021-04-30 MED ORDER — SODIUM CHLORIDE 0.9% IV SOLUTION
250.0000 mL | Freq: Once | INTRAVENOUS | Status: AC
  Administered 2021-04-30: 250 mL via INTRAVENOUS
  Filled 2021-04-30: qty 250

## 2021-04-30 MED ORDER — ACETAMINOPHEN 325 MG PO TABS
650.0000 mg | ORAL_TABLET | Freq: Once | ORAL | Status: AC
  Administered 2021-04-30: 650 mg via ORAL

## 2021-04-30 NOTE — Patient Instructions (Signed)
https://www.[facility].org/donate-blood/blood-donation-process/what-happens-to-donated-blood/blood-transfusions/types-of-blood-transfusions.html"> https://www.[facility].org/donate-blood/blood-donation-process/what-happens-to-donated-blood/blood-transfusions/risks-complications.html">  Transfusin de [employer], cuidados posteriores Blood Transfusion, Adult, Care After Esta hoja le brinda informacin sobre cmo cuidarse despus del procedimiento. Su mdico tambin podr darle instrucciones ms especficas. Comunquese con el mdico si tiene problemas o preguntas. Qu puedo esperar despus del procedimiento? Despus del procedimiento, es normal tener los siguientes sntomas:  Dolor y Tour manager donde se coloc la va intravenosa.  [place] del procedimiento. Esta puede ser la respuesta del cuerpo a las nuevas clulas sanguneas recibidas.  Dolor de [place]. Siga estas instrucciones en su casa: Cuidado del lugar de la insercin de la va intravenosa  Siga las instrucciones del mdico acerca del cuidado del lugar de la insercin de la va intravenosa. Asegrese de hacer lo siguiente: ? Lvese las manos con agua y [place] antes y despus de cambiar la venda (vendaje). Use desinfectante para manos si no dispone de [place] y [place]. ? Cambie los vendajes como se lo haya indicado el mdico.  Psychiatric nurse de insercin de la va intravenosa (IV) todos los das para descartar signos de infeccin. Preste atencin a los siguientes signos: ? Dolor, hinchazn o enrojecimiento. ? Sangrado proveniente del Environmental consultant. ? Calor. ? Pus o mal olor.      Instrucciones generales  Use los medicamentos de venta libre y los recetados solamente como se lo haya indicado el mdico.  Haga reposo como se lo haya indicado el mdico.  Retome sus actividades normales como se lo haya indicado el mdico.  Concurra a todas las visitas de seguimiento como se lo haya indicado el  mdico. Esto es importante. Comunquese con un mdico si:  Tiene picazn o zonas enrojecidas e hinchadas en la piel (urticaria).  Est ansioso.  Se siente dbil despus de realizar sus actividades habituales.  Tiene enrojecimiento, hinchazn, calor o dolor alrededor del lugar de la insercin de la va intravenosa.  Observa sangre que sale del lugar de la insercin de la va intravenosa que no se detiene al Oceanographer presin.  Tiene pus o percibe mal olor que proviene del lugar de la insercin de la va intravenosa. Solicite ayuda de inmediato si:  Tiene signos de Nurse, mental health grave o de una reaccin del sistema inmunitario, por ejemplo: ? Dificultad para respirar o falta de aire. ? Hinchazn de la cara o sensacin de sofoco. ? Fiebre o escalofros. ? Dolor de cabeza, en la espalda o el pecho. ? Orina de color oscuro o sangre en la orina. ? Erupcin cutnea generalizada. ? Latidos cardacos acelerados. ? Sentirse mareado o aturdido. Si recibe la transfusin de sangre en un entorno ambulatorio, le indicarn con quin debe ponerse en contacto para informar cualquier reaccin. Estos sntomas pueden representar un problema grave que constituye Engineer, maintenance (IT). No espere a ver si los sntomas desaparecen. Solicite atencin mdica de inmediato. Comunquese con el servicio de emergencias de su localidad (911 en los [place]). No conduzca por sus propios medios [employer]. Resumen  Es normal que tenga moretones y Social research officer, government a la palpacin alrededor del Environmental consultant de la insercin de la va intravenosa.  Controle TEFL teacher de insercin de la va intravenosa (IV) todos los das para descartar signos de infeccin.  Haga reposo como se lo haya indicado el mdico. Retome sus actividades normales como se lo haya indicado el mdico.  Busque ayuda de inmediato si tiene sntomas de una reaccin alrgica grave o de una reaccin del sistema inmunitario a una transfusin de [place].  Esta  informacin no tiene Marine scientist el consejo del mdico. Asegrese de hacerle al mdico cualquier pregunta que tenga. Document Revised: 07/04/2019 Document Reviewed: 07/04/2019 Elsevier Patient Education  [place].

## 2021-04-30 NOTE — Progress Notes (Signed)
Upon completion of first unit of blood patient noted to have temp 99.8 and BP remaining in the 80s. Dr. Marin Olp notified and pt to receive Tylenol and 2nd unit of blood. Pt medicated and blood started per MD orders.  Reviewed pt BPs over transfusion and final BP with Dr. Marin Olp. No new orders received and pt to be discharged home. Reviewed plan with patient who verbalized understanding and had no further questions. Pt aware to call clinic with any fevers or concerns. Pt verbalized understanding.

## 2021-05-01 ENCOUNTER — Inpatient Hospital Stay: Payer: Self-pay

## 2021-05-01 LAB — TYPE AND SCREEN
ABO/RH(D): O POS
Antibody Screen: NEGATIVE
Unit division: 0
Unit division: 0

## 2021-05-01 LAB — BPAM RBC
Blood Product Expiration Date: 202206262359
Blood Product Expiration Date: 202206262359
ISSUE DATE / TIME: 202206010751
ISSUE DATE / TIME: 202206010751
Unit Type and Rh: 5100
Unit Type and Rh: 5100

## 2021-05-02 ENCOUNTER — Telehealth: Payer: Self-pay

## 2021-05-02 ENCOUNTER — Inpatient Hospital Stay: Payer: Self-pay

## 2021-05-02 ENCOUNTER — Telehealth: Payer: Self-pay | Admitting: *Deleted

## 2021-05-02 NOTE — Telephone Encounter (Signed)
Oer 05/01/21 scheduling message - called and lvm of upcoming appointments

## 2021-05-05 ENCOUNTER — Inpatient Hospital Stay: Payer: Self-pay

## 2021-05-05 NOTE — Telephone Encounter (Signed)
Please advise patient to cut his carvedilol in half as well. Take 1/2 tablet twice daily. Continue current Entesto dose.

## 2021-05-05 NOTE — Telephone Encounter (Signed)
No answer left a vm to call back

## 2021-05-06 ENCOUNTER — Other Ambulatory Visit: Payer: Self-pay

## 2021-05-06 ENCOUNTER — Inpatient Hospital Stay: Payer: Self-pay

## 2021-05-06 ENCOUNTER — Inpatient Hospital Stay (HOSPITAL_BASED_OUTPATIENT_CLINIC_OR_DEPARTMENT_OTHER): Payer: Self-pay | Admitting: Family

## 2021-05-06 ENCOUNTER — Encounter: Payer: Self-pay | Admitting: Family

## 2021-05-06 ENCOUNTER — Telehealth: Payer: Self-pay | Admitting: *Deleted

## 2021-05-06 VITALS — BP 93/54 | HR 86 | Temp 98.4°F | Resp 17 | Wt 158.0 lb

## 2021-05-06 DIAGNOSIS — D46Z Other myelodysplastic syndromes: Secondary | ICD-10-CM

## 2021-05-06 DIAGNOSIS — D649 Anemia, unspecified: Secondary | ICD-10-CM

## 2021-05-06 DIAGNOSIS — D696 Thrombocytopenia, unspecified: Secondary | ICD-10-CM

## 2021-05-06 DIAGNOSIS — D461 Refractory anemia with ring sideroblasts: Secondary | ICD-10-CM

## 2021-05-06 LAB — CBC WITH DIFFERENTIAL (CANCER CENTER ONLY)
Abs Immature Granulocytes: 0 10*3/uL (ref 0.00–0.07)
Basophils Absolute: 0 10*3/uL (ref 0.0–0.1)
Basophils Relative: 0 %
Eosinophils Absolute: 0 10*3/uL (ref 0.0–0.5)
Eosinophils Relative: 0 %
HCT: 23.5 % — ABNORMAL LOW (ref 39.0–52.0)
Hemoglobin: 7.4 g/dL — ABNORMAL LOW (ref 13.0–17.0)
Immature Granulocytes: 0 %
Lymphocytes Relative: 81 %
Lymphs Abs: 1.3 10*3/uL (ref 0.7–4.0)
MCH: 27.1 pg (ref 26.0–34.0)
MCHC: 31.5 g/dL (ref 30.0–36.0)
MCV: 86.1 fL (ref 80.0–100.0)
Monocytes Absolute: 0.1 10*3/uL (ref 0.1–1.0)
Monocytes Relative: 6 %
Neutro Abs: 0.2 10*3/uL — CL (ref 1.7–7.7)
Neutrophils Relative %: 13 %
Platelet Count: 57 10*3/uL — ABNORMAL LOW (ref 150–400)
RBC: 2.73 MIL/uL — ABNORMAL LOW (ref 4.22–5.81)
RDW: 23.5 % — ABNORMAL HIGH (ref 11.5–15.5)
WBC Count: 1.6 10*3/uL — ABNORMAL LOW (ref 4.0–10.5)
nRBC: 45.2 % — ABNORMAL HIGH (ref 0.0–0.2)

## 2021-05-06 LAB — CMP (CANCER CENTER ONLY)
ALT: 19 U/L (ref 0–44)
AST: 16 U/L (ref 15–41)
Albumin: 3.3 g/dL — ABNORMAL LOW (ref 3.5–5.0)
Alkaline Phosphatase: 65 U/L (ref 38–126)
Anion gap: 6 (ref 5–15)
BUN: 18 mg/dL (ref 6–20)
CO2: 28 mmol/L (ref 22–32)
Calcium: 9.9 mg/dL (ref 8.9–10.3)
Chloride: 99 mmol/L (ref 98–111)
Creatinine: 0.73 mg/dL (ref 0.61–1.24)
GFR, Estimated: >60 mL/min (ref >60)
Glucose, Bld: 116 mg/dL — ABNORMAL HIGH (ref 70–99)
Potassium: 4.3 mmol/L (ref 3.5–5.1)
Sodium: 133 mmol/L — ABNORMAL LOW (ref 135–145)
Total Bilirubin: 1 mg/dL (ref 0.3–1.2)
Total Protein: 7.2 g/dL (ref 6.5–8.1)

## 2021-05-06 LAB — IRON AND TIBC
Iron: 39 ug/dL — ABNORMAL LOW (ref 42–163)
Saturation Ratios: 22 % (ref 20–55)
TIBC: 179 ug/dL — ABNORMAL LOW (ref 202–409)
UIBC: 139 ug/dL (ref 117–376)

## 2021-05-06 LAB — RETICULOCYTES
Immature Retic Fract: 6.7 % (ref 2.3–15.9)
RBC.: 2.7 MIL/uL — ABNORMAL LOW (ref 4.22–5.81)
Retic Count, Absolute: 73.5 10*3/uL (ref 19.0–186.0)
Retic Ct Pct: 2.7 % (ref 0.4–3.1)

## 2021-05-06 LAB — FERRITIN: Ferritin: 1409 ng/mL — ABNORMAL HIGH (ref 24–336)

## 2021-05-06 LAB — SAMPLE TO BLOOD BANK

## 2021-05-06 LAB — SAVE SMEAR(SSMR), FOR PROVIDER SLIDE REVIEW

## 2021-05-06 MED ORDER — ONDANSETRON HCL 8 MG PO TABS
ORAL_TABLET | ORAL | Status: AC
  Filled 2021-05-06: qty 1

## 2021-05-06 MED ORDER — AZACITIDINE CHEMO SQ INJECTION
75.0000 mg/m2 | Freq: Once | INTRAMUSCULAR | Status: AC
  Administered 2021-05-06: 142.5 mg via SUBCUTANEOUS
  Filled 2021-05-06: qty 5.7

## 2021-05-06 MED ORDER — ONDANSETRON HCL 8 MG PO TABS: 8.0000 mg | ORAL_TABLET | Freq: Once | ORAL | Status: DC

## 2021-05-06 NOTE — Progress Notes (Signed)
Hematology and Oncology Follow Up Visit  Constantine Ruddick [id number] [birth year] 55 y.o. 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 Heggs is here today for follow-up and treatment. He is in a wheel chair and feeling fatigued/weak. His Hgb is improved at 7.4 after receiving 2 units of blood last week on 6/1.  Her was referred By Dr. Mariea Clonts to Dr. Berton Bon and states that after his visit yesterday they have determined that his siblings may not be candidates for transplant due to age. They would prefer his donor be under the age of 42.  Hopefully they will be able to find a good match.  He notes occasional dizziness and mild SOB with over exertion and rests as needed at home. His wife is able to stay home and take care of him.  No fever, chills, n/v, cough, rash, chest pain, palpitations, abdominal pain or changes in bowel or bladder habits.  He is taking Miralax to help prevent constipation. He has noted a little rectal blood loss with straining at times.  No other blood loss noted. No abnormal bruising, no petechiae.  He has swelling in his feet. No redness or edema. No pitting. Pedal pulses are 2+.  No tenderness, numbness or tingling in his extremities at this time.  No falls or syncope to report.  He states that at this time he has a good appetite and is doing his best to stay well hydrated. His weight is stable at 158 lbs.   ECOG Performance Status: 2 - Symptomatic, <50% confined to bed  Medications:  Allergies as of 05/06/2021      Reactions   Ciprofloxacin Rash   Diflucan [fluconazole] Rash   Famvir [famciclovir] Rash      Medication List       Accurate as of May 06, 2021 10:40 AM. If you have  any questions, ask your nurse or doctor.        azaCITIDine 100 MG Susr Commonly known as: VIDAZA Vidaza 142.5 mg (rounded from 143.25 mg)=75 mg/m2 x 1.91 m2 daily times five days every twenty eight days What changed:   how much to take  how to take this  when to take this   carvedilol 3.125 MG tablet Commonly known as: COREG Take 1 tablet (3.125 mg total) by mouth 2 (two) times daily with a meal.   digoxin 0.25 MG tablet Commonly known as: LANOXIN Take 1 tablet (0.25 mg total) by mouth daily.   megestrol 40 MG/ML suspension Commonly known as: MEGACE Take 10 mLs (400 mg total) by mouth 2 (two) times daily.   MULTIVITAMIN/IRON PO Take 1 tablet by mouth daily.   prochlorperazine 10 MG tablet Commonly known as: COMPAZINE Take 1 tablet (10 mg total) by mouth every 6 (six) hours as needed (Nausea or vomiting). What changed: reasons to take this   sacubitril-valsartan 24-26 MG Commonly known as: ENTRESTO Take 0.5 tablets by mouth 2 (two) times daily.   traMADol 50 MG tablet Commonly known as: ULTRAM Take 2 tablets (100 mg total) by mouth every 6 (six) hours as needed. 1-2 tablets (50mg - 100mg ) by mouth every 6 hours as needed       Allergies:  Allergies  Allergen Reactions  . Ciprofloxacin Rash  . Diflucan [Fluconazole] Rash  . Famvir [Famciclovir] Rash    Past Medical History, Surgical history, Social history, and Family History were reviewed and updated.  Review of Systems: All other 10 point review of systems is negative.   Physical Exam:  weight is 158 lb (71.7 kg). His oral temperature is 98.4 F (36.9 C). His blood pressure is 93/54 (abnormal) and his pulse is 86. His respiration is 17 and oxygen saturation is 100%.   Wt Readings from Last 3 Encounters:  05/06/21 158 lb (71.7 kg)  04/29/21 160 lb (72.6 kg)  04/25/21 158 lb (71.7 kg)    Ocular: Sclerae unicteric, pupils equal, round and reactive to light Ear-nose-throat: Oropharynx clear, dentition  fair Lymphatic: No cervical or supraclavicular adenopathy Lungs no rales or rhonchi, good excursion bilaterally Heart regular rate and rhythm, no murmur appreciated Abd soft, nontender, positive bowel sounds, no liver or spleen tip palpated on exam, no fluid wave  MSK no focal spinal tenderness, no joint edema Neuro: non-focal, well-oriented, appropriate affect Breasts: Deferred   Lab Results  Component Value Date   WBC 1.6 (L) 05/06/2021   HGB 7.4 (L) 05/06/2021   HCT 23.5 (L) 05/06/2021   MCV 86.1 05/06/2021   PLT 57 (L) 05/06/2021   Lab Results  Component Value Date   FERRITIN 1,290 (H) 04/29/2021   IRON 47 04/29/2021   TIBC 195 (L) 04/29/2021   UIBC 148 04/29/2021   IRONPCTSAT 24 04/29/2021   Lab Results  Component Value Date   RETICCTPCT 5.2 (H) 04/29/2021   RBC 2.73 (L) 05/06/2021   No results found for: KPAFRELGTCHN, LAMBDASER, KAPLAMBRATIO No results found for: IGGSERUM, IGA, IGMSERUM No results found for: Ronnald Ramp, A1GS, A2GS, Arnaldo Natal, GAMS, MSPIKE, SPEI   Chemistry      Component Value Date/Time   NA 133 (L) 05/06/2021 1000   K 4.3 05/06/2021 1000   CL 99 05/06/2021 1000   CO2 28 05/06/2021 1000   BUN 18 05/06/2021 1000   CREATININE 0.73 05/06/2021 1000      Component Value Date/Time   CALCIUM 9.9 05/06/2021 1000   ALKPHOS 65 05/06/2021 1000   AST 16 05/06/2021 1000   ALT 19 05/06/2021 1000   BILITOT 1.0 05/06/2021 1000       Impression and Plan: Mr. Litke is a very pleasant 55 yo Hispanic gentleman with high grade myelodysplasia, complex cytogenetics.  He has seen a transplant specialist with [facility] and they are working to find a M[employer] for allogenic transplant.  We will proceed with Vidaza today as planned per MD. This week is cycle 3.  I went over today's CBC and CMP with Dr. Marin Olp in detail. No transfusion needed at this time but we will recheck lab work on thursday and transfuse at that time if needed.  Weekly lab  check and follow-up in 4 weeks.  He was encouraged to contact our office with any questions or concerns.   Laverna Peace, NP 6/7/202210:40 AM

## 2021-05-06 NOTE — Patient Instructions (Signed)
Azacitidine suspension for injection (subcutaneous use) What is this medicine? AZACITIDINE (ay za SITE i deen) is a chemotherapy drug. This medicine reduces the growth of cancer cells and can suppress the immune system. It is used for treating myelodysplastic syndrome or some types of leukemia. This medicine may be used for other purposes; ask your health care provider or pharmacist if you have questions. COMMON BRAND NAME(S): Vidaza What should I tell my health care provider before I take this medicine? They need to know if you have any of these conditions:  kidney disease  liver disease  liver tumors  an unusual or allergic reaction to azacitidine, mannitol, other medicines, foods, dyes, or preservatives  pregnant or trying to get pregnant  breast-feeding How should I use this medicine? This medicine is for injection under the skin. It is administered in a hospital or clinic by a specially trained health care professional. Talk to your pediatrician regarding the use of this medicine in children. While this drug may be prescribed for selected conditions, precautions do apply. Overdosage: If you think you have taken too much of this medicine contact a poison control center or emergency room at once. NOTE: This medicine is only for you. Do not share this medicine with others. What if I miss a dose? It is important not to miss your dose. Call your doctor or health care professional if you are unable to keep an appointment. What may interact with this medicine? Interactions have not been studied. Give your health care provider a list of all the medicines, herbs, non-prescription drugs, or dietary supplements you use. Also tell them if you smoke, drink alcohol, or use illegal drugs. Some items may interact with your medicine. This list may not describe all possible interactions. Give your health care provider a list of all the medicines, herbs, non-prescription drugs, or dietary supplements  you use. Also tell them if you smoke, drink alcohol, or use illegal drugs. Some items may interact with your medicine. What should I watch for while using this medicine? Visit your doctor for checks on your progress. This drug may make you feel generally unwell. This is not uncommon, as chemotherapy can affect healthy cells as well as cancer cells. Report any side effects. Continue your course of treatment even though you feel ill unless your doctor tells you to stop. In some cases, you may be given additional medicines to help with side effects. Follow all directions for their use. Call your doctor or health care professional for advice if you get a fever, chills or sore throat, or other symptoms of a cold or flu. Do not treat yourself. This drug decreases your body's ability to fight infections. Try to avoid being around people who are sick. This medicine may increase your risk to bruise or bleed. Call your doctor or health care professional if you notice any unusual bleeding. You may need blood work done while you are taking this medicine. Do not become pregnant while taking this medicine and for 6 months after the last dose. Women should inform their doctor if they wish to become pregnant or think they might be pregnant. Men should not father a child while taking this medicine and for 3 months after the last dose. There is a potential for serious side effects to an unborn child. Talk to your health care professional or pharmacist for more information. Do not breast-feed an infant while taking this medicine and for 1 week after the last dose. This medicine may interfere with   the ability to have a child. Talk with your doctor or health care professional if you are concerned about your fertility. What side effects may I notice from receiving this medicine? Side effects that you should report to your doctor or health care professional as soon as possible:  allergic reactions like skin rash, itching or  hives, swelling of the face, lips, or tongue  low blood counts - this medicine may decrease the number of white blood cells, red blood cells and platelets. You may be at increased risk for infections and bleeding.  signs of infection - fever or chills, cough, sore throat, pain passing urine  signs of decreased platelets or bleeding - bruising, pinpoint red spots on the skin, black, tarry stools, blood in the urine  signs of decreased red blood cells - unusually weak or tired, fainting spells, lightheadedness  signs and symptoms of kidney injury like trouble passing urine or change in the amount of urine  signs and symptoms of liver injury like dark yellow or brown urine; general ill feeling or flu-like symptoms; light-colored stools; loss of appetite; nausea; right upper belly pain; unusually weak or tired; yellowing of the eyes or skin Side effects that usually do not require medical attention (report to your doctor or health care professional if they continue or are bothersome):  constipation  diarrhea  nausea, vomiting  pain or redness at the injection site  unusually weak or tired This list may not describe all possible side effects. Call your doctor for medical advice about side effects. You may report side effects to FDA at 1-800-FDA-1088. Where should I keep my medicine? This drug is given in a hospital or clinic and will not be stored at home. NOTE: This sheet is a summary. It may not cover all possible information. If you have questions about this medicine, talk to your doctor, pharmacist, or health care provider.  2021 Elsevier/Gold Standard (2016-12-15 14:37:51)  

## 2021-05-06 NOTE — Telephone Encounter (Signed)
Jory Ee NP notified of ANC-0.2.  No new orders received at this time.

## 2021-05-07 ENCOUNTER — Inpatient Hospital Stay: Payer: Self-pay

## 2021-05-07 VITALS — BP 91/48 | HR 86 | Temp 98.2°F | Resp 16

## 2021-05-07 DIAGNOSIS — D46Z Other myelodysplastic syndromes: Secondary | ICD-10-CM

## 2021-05-07 MED ORDER — AZACITIDINE CHEMO SQ INJECTION
75.0000 mg/m2 | Freq: Once | INTRAMUSCULAR | Status: AC
  Administered 2021-05-07: 142.5 mg via SUBCUTANEOUS
  Filled 2021-05-07: qty 5.7

## 2021-05-07 NOTE — Patient Instructions (Signed)
Azacitidine suspension for injection (subcutaneous use) What is this medicine? AZACITIDINE (ay za SITE i deen) is a chemotherapy drug. This medicine reduces the growth of cancer cells and can suppress the immune system. It is used for treating myelodysplastic syndrome or some types of leukemia. This medicine may be used for other purposes; ask your health care provider or pharmacist if you have questions. COMMON BRAND NAME(S): Vidaza What should I tell my health care provider before I take this medicine? They need to know if you have any of these conditions:  kidney disease  liver disease  liver tumors  an unusual or allergic reaction to azacitidine, mannitol, other medicines, foods, dyes, or preservatives  pregnant or trying to get pregnant  breast-feeding How should I use this medicine? This medicine is for injection under the skin. It is administered in a hospital or clinic by a specially trained health care professional. Talk to your pediatrician regarding the use of this medicine in children. While this drug may be prescribed for selected conditions, precautions do apply. Overdosage: If you think you have taken too much of this medicine contact a poison control center or emergency room at once. NOTE: This medicine is only for you. Do not share this medicine with others. What if I miss a dose? It is important not to miss your dose. Call your doctor or health care professional if you are unable to keep an appointment. What may interact with this medicine? Interactions have not been studied. Give your health care provider a list of all the medicines, herbs, non-prescription drugs, or dietary supplements you use. Also tell them if you smoke, drink alcohol, or use illegal drugs. Some items may interact with your medicine. This list may not describe all possible interactions. Give your health care provider a list of all the medicines, herbs, non-prescription drugs, or dietary supplements  you use. Also tell them if you smoke, drink alcohol, or use illegal drugs. Some items may interact with your medicine. What should I watch for while using this medicine? Visit your doctor for checks on your progress. This drug may make you feel generally unwell. This is not uncommon, as chemotherapy can affect healthy cells as well as cancer cells. Report any side effects. Continue your course of treatment even though you feel ill unless your doctor tells you to stop. In some cases, you may be given additional medicines to help with side effects. Follow all directions for their use. Call your doctor or health care professional for advice if you get a fever, chills or sore throat, or other symptoms of a cold or flu. Do not treat yourself. This drug decreases your body's ability to fight infections. Try to avoid being around people who are sick. This medicine may increase your risk to bruise or bleed. Call your doctor or health care professional if you notice any unusual bleeding. You may need blood work done while you are taking this medicine. Do not become pregnant while taking this medicine and for 6 months after the last dose. Women should inform their doctor if they wish to become pregnant or think they might be pregnant. Men should not father a child while taking this medicine and for 3 months after the last dose. There is a potential for serious side effects to an unborn child. Talk to your health care professional or pharmacist for more information. Do not breast-feed an infant while taking this medicine and for 1 week after the last dose. This medicine may interfere with   the ability to have a child. Talk with your doctor or health care professional if you are concerned about your fertility. What side effects may I notice from receiving this medicine? Side effects that you should report to your doctor or health care professional as soon as possible:  allergic reactions like skin rash, itching or  hives, swelling of the face, lips, or tongue  low blood counts - this medicine may decrease the number of white blood cells, red blood cells and platelets. You may be at increased risk for infections and bleeding.  signs of infection - fever or chills, cough, sore throat, pain passing urine  signs of decreased platelets or bleeding - bruising, pinpoint red spots on the skin, black, tarry stools, blood in the urine  signs of decreased red blood cells - unusually weak or tired, fainting spells, lightheadedness  signs and symptoms of kidney injury like trouble passing urine or change in the amount of urine  signs and symptoms of liver injury like dark yellow or brown urine; general ill feeling or flu-like symptoms; light-colored stools; loss of appetite; nausea; right upper belly pain; unusually weak or tired; yellowing of the eyes or skin Side effects that usually do not require medical attention (report to your doctor or health care professional if they continue or are bothersome):  constipation  diarrhea  nausea, vomiting  pain or redness at the injection site  unusually weak or tired This list may not describe all possible side effects. Call your doctor for medical advice about side effects. You may report side effects to FDA at 1-800-FDA-1088. Where should I keep my medicine? This drug is given in a hospital or clinic and will not be stored at home. NOTE: This sheet is a summary. It may not cover all possible information. If you have questions about this medicine, talk to your doctor, pharmacist, or health care provider.  2021 Elsevier/Gold Standard (2016-12-15 14:37:51)  

## 2021-05-07 NOTE — Telephone Encounter (Signed)
Spoke to patient he is aware

## 2021-05-08 ENCOUNTER — Inpatient Hospital Stay: Payer: Self-pay

## 2021-05-08 ENCOUNTER — Telehealth: Payer: Self-pay

## 2021-05-08 ENCOUNTER — Other Ambulatory Visit: Payer: Self-pay

## 2021-05-08 ENCOUNTER — Encounter: Payer: Self-pay | Admitting: *Deleted

## 2021-05-08 VITALS — BP 100/51 | HR 88 | Temp 98.3°F | Resp 17

## 2021-05-08 DIAGNOSIS — D649 Anemia, unspecified: Secondary | ICD-10-CM

## 2021-05-08 DIAGNOSIS — D46Z Other myelodysplastic syndromes: Secondary | ICD-10-CM

## 2021-05-08 DIAGNOSIS — D696 Thrombocytopenia, unspecified: Secondary | ICD-10-CM

## 2021-05-08 DIAGNOSIS — D461 Refractory anemia with ring sideroblasts: Secondary | ICD-10-CM

## 2021-05-08 LAB — CBC WITH DIFFERENTIAL (CANCER CENTER ONLY)
Abs Immature Granulocytes: 0 10*3/uL (ref 0.00–0.07)
Basophils Absolute: 0 10*3/uL (ref 0.0–0.1)
Basophils Relative: 0 %
Eosinophils Absolute: 0 10*3/uL (ref 0.0–0.5)
Eosinophils Relative: 1 %
HCT: 23.9 % — ABNORMAL LOW (ref 39.0–52.0)
Hemoglobin: 7.5 g/dL — ABNORMAL LOW (ref 13.0–17.0)
Immature Granulocytes: 0 %
Lymphocytes Relative: 86 %
Lymphs Abs: 1.8 10*3/uL (ref 0.7–4.0)
MCH: 27.3 pg (ref 26.0–34.0)
MCHC: 31.4 g/dL (ref 30.0–36.0)
MCV: 86.9 fL (ref 80.0–100.0)
Monocytes Absolute: 0.1 10*3/uL (ref 0.1–1.0)
Monocytes Relative: 4 %
Neutro Abs: 0.2 10*3/uL — CL (ref 1.7–7.7)
Neutrophils Relative %: 9 %
Platelet Count: 53 10*3/uL — ABNORMAL LOW (ref 150–400)
RBC: 2.75 MIL/uL — ABNORMAL LOW (ref 4.22–5.81)
RDW: 23.9 % — ABNORMAL HIGH (ref 11.5–15.5)
WBC Count: 2.1 10*3/uL — ABNORMAL LOW (ref 4.0–10.5)
nRBC: 17.9 % — ABNORMAL HIGH (ref 0.0–0.2)

## 2021-05-08 LAB — CMP (CANCER CENTER ONLY)
ALT: 17 U/L (ref 0–44)
AST: 15 U/L (ref 15–41)
Albumin: 3.5 g/dL (ref 3.5–5.0)
Alkaline Phosphatase: 63 U/L (ref 38–126)
Anion gap: 5 (ref 5–15)
BUN: 19 mg/dL (ref 6–20)
CO2: 27 mmol/L (ref 22–32)
Calcium: 10.3 mg/dL (ref 8.9–10.3)
Chloride: 100 mmol/L (ref 98–111)
Creatinine: 0.72 mg/dL (ref 0.61–1.24)
GFR, Estimated: >60 mL/min (ref >60)
Glucose, Bld: 103 mg/dL — ABNORMAL HIGH (ref 70–99)
Potassium: 4.3 mmol/L (ref 3.5–5.1)
Sodium: 132 mmol/L — ABNORMAL LOW (ref 135–145)
Total Bilirubin: 0.7 mg/dL (ref 0.3–1.2)
Total Protein: 7.5 g/dL (ref 6.5–8.1)

## 2021-05-08 LAB — SAMPLE TO BLOOD BANK

## 2021-05-08 MED ORDER — AZACITIDINE CHEMO SQ INJECTION
75.0000 mg/m2 | Freq: Once | INTRAMUSCULAR | Status: AC
  Administered 2021-05-08: 142.5 mg via SUBCUTANEOUS
  Filled 2021-05-08: qty 5.7

## 2021-05-08 MED ORDER — ONDANSETRON HCL 8 MG PO TABS: 8.0000 mg | ORAL_TABLET | Freq: Once | ORAL | Status: DC

## 2021-05-08 NOTE — Progress Notes (Signed)
Hgb 7.5  Dr Marin Olp notified.  No blood needed at this time.  Also [facility] ANC reported.  Dr Marin Olp aware no orders on this result

## 2021-05-08 NOTE — Patient Instructions (Signed)
[facility] [place]  Discharge Instructions: Thank you for choosing [facility] to provide your oncology and hematology care.   If you have a lab appointment with the [facility], please go directly to the [facility] and check in at the registration area.  Wear comfortable clothing and clothing appropriate for easy access to any Portacath or PICC line.   We strive to give you quality time with your provider. You may need to reschedule your appointment if you arrive late (15 or more minutes).  Arriving late affects you and other patients whose appointments are after yours.  Also, if you miss three or more appointments without notifying the office, you may be dismissed from the clinic at the provider's discretion.      For prescription refill requests, have your pharmacy contact our office and allow 72 hours for refills to be completed.    Today you received the following chemotherapy and/or immunotherapy agents Vidaza.      To help prevent nausea and vomiting after your treatment, we encourage you to take your nausea medication as directed.  BELOW ARE SYMPTOMS THAT SHOULD BE REPORTED IMMEDIATELY: *FEVER GREATER THAN 100.4 F (38 C) OR HIGHER *CHILLS OR SWEATING *NAUSEA AND VOMITING THAT IS NOT CONTROLLED WITH YOUR NAUSEA MEDICATION *UNUSUAL SHORTNESS OF BREATH *UNUSUAL BRUISING OR BLEEDING *URINARY PROBLEMS (pain or burning when urinating, or frequent urination) *BOWEL PROBLEMS (unusual diarrhea, constipation, pain near the anus) TENDERNESS IN MOUTH AND THROAT WITH OR WITHOUT PRESENCE OF ULCERS (sore throat, sores in mouth, or a toothache) UNUSUAL RASH, SWELLING OR PAIN  UNUSUAL VAGINAL DISCHARGE OR ITCHING   Items with * indicate a potential emergency and should be followed up as soon as possible or go to the Emergency Department if any problems should occur.  Please show the CHEMOTHERAPY ALERT CARD or IMMUNOTHERAPY ALERT CARD at check-in to the  Emergency Department and triage nurse. Should you have questions after your visit or need to cancel or reschedule your appointment, please contact [facility]  [phone] and follow the prompts.  Office hours are 8:00 a.m. to 4:30 p.m. Monday - Friday. Please note that voicemails left after 4:00 p.m. may not be returned until the following business day.  We are closed weekends and major holidays. You have access to a nurse at all times for urgent questions. Please call the main number to the clinic [phone] and follow the prompts.  For any non-urgent questions, you may also contact your provider using MyChart. We now offer e-Visits for anyone 45 and older to request care online for non-urgent symptoms. For details visit [email or web address].   Also download the MyChart app! Go to the app store, search "MyChart", open the app, select [facility], and log in with your MyChart username and password.  Due to Covid, a mask is required upon entering the hospital/clinic. If you do not have a mask, one will be given to you upon arrival. For doctor visits, patients may have 1 support person aged 22 or older with them. For treatment visits, patients cannot have anyone with them due to current Covid guidelines and our immunocompromised population.

## 2021-05-08 NOTE — Telephone Encounter (Signed)
Per 05/06/21 los appts have been added, pt needed labs today per los and pt was added on after tx, all other appts have been made and pt will gain this sch at his 6/10 appt    Thomas Frazier

## 2021-05-09 ENCOUNTER — Inpatient Hospital Stay: Payer: Self-pay

## 2021-05-09 VITALS — BP 100/50 | HR 92 | Temp 98.2°F | Resp 18

## 2021-05-09 DIAGNOSIS — D46Z Other myelodysplastic syndromes: Secondary | ICD-10-CM

## 2021-05-09 MED ORDER — ONDANSETRON HCL 8 MG PO TABS: 8.0000 mg | ORAL_TABLET | Freq: Once | ORAL | Status: DC

## 2021-05-09 MED ORDER — AZACITIDINE CHEMO SQ INJECTION
75.0000 mg/m2 | Freq: Once | INTRAMUSCULAR | Status: AC
  Administered 2021-05-09: 142.5 mg via SUBCUTANEOUS
  Filled 2021-05-09: qty 5.7

## 2021-05-09 NOTE — Patient Instructions (Signed)
Azacitidine suspension for injection (subcutaneous use) What is this medication? AZACITIDINE (ay za SITE i deen) is a chemotherapy drug. This medicine reduces the growth of cancer cells and can suppress the immune system. It is used fortreating myelodysplastic syndrome or some types of leukemia. This medicine may be used for other purposes; ask your health care provider orpharmacist if you have questions. COMMON BRAND NAME(S): Vidaza What should I tell my care team before I take this medication? They need to know if you have any of these conditions: kidney disease liver disease liver tumors an unusual or allergic reaction to azacitidine, mannitol, other medicines, foods, dyes, or preservatives pregnant or trying to get pregnant breast-feeding How should I use this medication? This medicine is for injection under the skin. It is administered in a hospitalor clinic by a specially trained health care professional. Talk to your pediatrician regarding the use of this medicine in children. Whilethis drug may be prescribed for selected conditions, precautions do apply. Overdosage: If you think you have taken too much of this medicine contact apoison control center or emergency room at once. NOTE: This medicine is only for you. Do not share this medicine with others. What if I miss a dose? It is important not to miss your dose. Call your doctor or health careprofessional if you are unable to keep an appointment. What may interact with this medication? Interactions have not been studied. Give your health care provider a list of all the medicines, herbs, non-prescription drugs, or dietary supplements you use. Also tell them if you smoke, drink alcohol, or use illegal drugs. Some items may interact with yourmedicine. This list may not describe all possible interactions. Give your health care provider a list of all the medicines, herbs, non-prescription drugs, or dietary supplements you use. Also tell  them if you smoke, drink alcohol, or use illegaldrugs. Some items may interact with your medicine. What should I watch for while using this medication? Visit your doctor for checks on your progress. This drug may make you feel generally unwell. This is not uncommon, as chemotherapy can affect healthy cells as well as cancer cells. Report any side effects. Continue your course oftreatment even though you feel ill unless your doctor tells you to stop. In some cases, you may be given additional medicines to help with side effects.Follow all directions for their use. Call your doctor or health care professional for advice if you get a fever, chills or sore throat, or other symptoms of a cold or flu. Do not treat yourself. This drug decreases your body's ability to fight infections. Try toavoid being around people who are sick. This medicine may increase your risk to bruise or bleed. Call your doctor orhealth care professional if you notice any unusual bleeding. You may need blood work done while you are taking this medicine. Do not become pregnant while taking this medicine and for 6 months after the last dose. Women should inform their doctor if they wish to become pregnant or think they might be pregnant. Men should not father a child while taking this medicine and for 3 months after the last dose. There is a potential for serious side effects to an unborn child. Talk to your health care professional or pharmacist for more information. Do not breast-feed an infant while taking thismedicine and for 1 week after the last dose. This medicine may interfere with the ability to have a child. Talk with yourdoctor or health care professional if you are concerned about your fertility.   What side effects may I notice from receiving this medication? Side effects that you should report to your doctor or health care professionalas soon as possible: allergic reactions like skin rash, itching or hives, swelling of the  face, lips, or tongue low blood counts - this medicine may decrease the number of white blood cells, red blood cells and platelets. You may be at increased risk for infections and bleeding. signs of infection - fever or chills, cough, sore throat, pain passing urine signs of decreased platelets or bleeding - bruising, pinpoint red spots on the skin, black, tarry stools, blood in the urine signs of decreased red blood cells - unusually weak or tired, fainting spells, lightheadedness signs and symptoms of kidney injury like trouble passing urine or change in the amount of urine signs and symptoms of liver injury like dark yellow or brown urine; general ill feeling or flu-like symptoms; light-colored stools; loss of appetite; nausea; right upper belly pain; unusually weak or tired; yellowing of the eyes or skin Side effects that usually do not require medical attention (report to yourdoctor or health care professional if they continue or are bothersome): constipation diarrhea nausea, vomiting pain or redness at the injection site unusually weak or tired This list may not describe all possible side effects. Call your doctor for medical advice about side effects. You may report side effects to FDA at1-800-FDA-1088. Where should I keep my medication? This drug is given in a hospital or clinic and will not be stored at home. NOTE: This sheet is a summary. It may not cover all possible information. If you have questions about this medicine, talk to your doctor, pharmacist, orhealth care provider.  2022 Elsevier/Gold Standard (2016-12-15 14:37:51)  

## 2021-05-12 ENCOUNTER — Inpatient Hospital Stay: Payer: Self-pay

## 2021-05-12 ENCOUNTER — Other Ambulatory Visit: Payer: Self-pay

## 2021-05-12 VITALS — BP 101/53 | HR 94 | Temp 97.7°F | Resp 18

## 2021-05-12 DIAGNOSIS — D461 Refractory anemia with ring sideroblasts: Secondary | ICD-10-CM

## 2021-05-12 DIAGNOSIS — D696 Thrombocytopenia, unspecified: Secondary | ICD-10-CM

## 2021-05-12 DIAGNOSIS — D46Z Other myelodysplastic syndromes: Secondary | ICD-10-CM

## 2021-05-12 DIAGNOSIS — D649 Anemia, unspecified: Secondary | ICD-10-CM

## 2021-05-12 LAB — CBC WITH DIFFERENTIAL (CANCER CENTER ONLY)
Abs Immature Granulocytes: 0 10*3/uL (ref 0.00–0.07)
Basophils Absolute: 0 10*3/uL (ref 0.0–0.1)
Basophils Relative: 0 %
Eosinophils Absolute: 0 10*3/uL (ref 0.0–0.5)
Eosinophils Relative: 0 %
HCT: 22.3 % — ABNORMAL LOW (ref 39.0–52.0)
Hemoglobin: 7.2 g/dL — ABNORMAL LOW (ref 13.0–17.0)
Immature Granulocytes: 0 %
Lymphocytes Relative: 81 %
Lymphs Abs: 1.4 10*3/uL (ref 0.7–4.0)
MCH: 27.6 pg (ref 26.0–34.0)
MCHC: 32.3 g/dL (ref 30.0–36.0)
MCV: 85.4 fL (ref 80.0–100.0)
Monocytes Absolute: 0.1 10*3/uL (ref 0.1–1.0)
Monocytes Relative: 4 %
Neutro Abs: 0.3 10*3/uL — CL (ref 1.7–7.7)
Neutrophils Relative %: 15 %
Platelet Count: 41 10*3/uL — ABNORMAL LOW (ref 150–400)
RBC: 2.61 MIL/uL — ABNORMAL LOW (ref 4.22–5.81)
RDW: 24.7 % — ABNORMAL HIGH (ref 11.5–15.5)
WBC Count: 1.7 10*3/uL — ABNORMAL LOW (ref 4.0–10.5)
nRBC: 4.7 % — ABNORMAL HIGH (ref 0.0–0.2)

## 2021-05-12 LAB — CMP (CANCER CENTER ONLY)
ALT: 19 U/L (ref 0–44)
AST: 16 U/L (ref 15–41)
Albumin: 3.6 g/dL (ref 3.5–5.0)
Alkaline Phosphatase: 77 U/L (ref 38–126)
Anion gap: 5 (ref 5–15)
BUN: 24 mg/dL — ABNORMAL HIGH (ref 6–20)
CO2: 28 mmol/L (ref 22–32)
Calcium: 9.9 mg/dL (ref 8.9–10.3)
Chloride: 101 mmol/L (ref 98–111)
Creatinine: 0.7 mg/dL (ref 0.61–1.24)
GFR, Estimated: >60 mL/min (ref >60)
Glucose, Bld: 111 mg/dL — ABNORMAL HIGH (ref 70–99)
Potassium: 4.2 mmol/L (ref 3.5–5.1)
Sodium: 134 mmol/L — ABNORMAL LOW (ref 135–145)
Total Bilirubin: 0.6 mg/dL (ref 0.3–1.2)
Total Protein: 7.5 g/dL (ref 6.5–8.1)

## 2021-05-12 LAB — SAMPLE TO BLOOD BANK

## 2021-05-12 MED ORDER — AZACITIDINE CHEMO SQ INJECTION
75.0000 mg/m2 | Freq: Once | INTRAMUSCULAR | Status: AC
  Administered 2021-05-12: 142.5 mg via SUBCUTANEOUS
  Filled 2021-05-12: qty 5.7

## 2021-05-12 MED ORDER — ONDANSETRON HCL 8 MG PO TABS: 8.0000 mg | ORAL_TABLET | Freq: Once | ORAL | Status: DC

## 2021-05-12 NOTE — Progress Notes (Signed)
Per Dr. Marin Olp, it's OK to treat with today's labs.

## 2021-05-12 NOTE — Patient Instructions (Signed)
Azacitidine suspension for injection (subcutaneous use) What is this medication? AZACITIDINE (ay za SITE i deen) is a chemotherapy drug. This medicine reduces the growth of cancer cells and can suppress the immune system. It is used fortreating myelodysplastic syndrome or some types of leukemia. This medicine may be used for other purposes; ask your health care provider orpharmacist if you have questions. COMMON BRAND NAME(S): Vidaza What should I tell my care team before I take this medication? They need to know if you have any of these conditions: kidney disease liver disease liver tumors an unusual or allergic reaction to azacitidine, mannitol, other medicines, foods, dyes, or preservatives pregnant or trying to get pregnant breast-feeding How should I use this medication? This medicine is for injection under the skin. It is administered in a hospitalor clinic by a specially trained health care professional. Talk to your pediatrician regarding the use of this medicine in children. Whilethis drug may be prescribed for selected conditions, precautions do apply. Overdosage: If you think you have taken too much of this medicine contact apoison control center or emergency room at once. NOTE: This medicine is only for you. Do not share this medicine with others. What if I miss a dose? It is important not to miss your dose. Call your doctor or health careprofessional if you are unable to keep an appointment. What may interact with this medication? Interactions have not been studied. Give your health care provider a list of all the medicines, herbs, non-prescription drugs, or dietary supplements you use. Also tell them if you smoke, drink alcohol, or use illegal drugs. Some items may interact with yourmedicine. This list may not describe all possible interactions. Give your health care provider a list of all the medicines, herbs, non-prescription drugs, or dietary supplements you use. Also tell  them if you smoke, drink alcohol, or use illegaldrugs. Some items may interact with your medicine. What should I watch for while using this medication? Visit your doctor for checks on your progress. This drug may make you feel generally unwell. This is not uncommon, as chemotherapy can affect healthy cells as well as cancer cells. Report any side effects. Continue your course oftreatment even though you feel ill unless your doctor tells you to stop. In some cases, you may be given additional medicines to help with side effects.Follow all directions for their use. Call your doctor or health care professional for advice if you get a fever, chills or sore throat, or other symptoms of a cold or flu. Do not treat yourself. This drug decreases your body's ability to fight infections. Try toavoid being around people who are sick. This medicine may increase your risk to bruise or bleed. Call your doctor orhealth care professional if you notice any unusual bleeding. You may need blood work done while you are taking this medicine. Do not become pregnant while taking this medicine and for 6 months after the last dose. Women should inform their doctor if they wish to become pregnant or think they might be pregnant. Men should not father a child while taking this medicine and for 3 months after the last dose. There is a potential for serious side effects to an unborn child. Talk to your health care professional or pharmacist for more information. Do not breast-feed an infant while taking thismedicine and for 1 week after the last dose. This medicine may interfere with the ability to have a child. Talk with yourdoctor or health care professional if you are concerned about your fertility.   What side effects may I notice from receiving this medication? Side effects that you should report to your doctor or health care professionalas soon as possible: allergic reactions like skin rash, itching or hives, swelling of the  face, lips, or tongue low blood counts - this medicine may decrease the number of white blood cells, red blood cells and platelets. You may be at increased risk for infections and bleeding. signs of infection - fever or chills, cough, sore throat, pain passing urine signs of decreased platelets or bleeding - bruising, pinpoint red spots on the skin, black, tarry stools, blood in the urine signs of decreased red blood cells - unusually weak or tired, fainting spells, lightheadedness signs and symptoms of kidney injury like trouble passing urine or change in the amount of urine signs and symptoms of liver injury like dark yellow or brown urine; general ill feeling or flu-like symptoms; light-colored stools; loss of appetite; nausea; right upper belly pain; unusually weak or tired; yellowing of the eyes or skin Side effects that usually do not require medical attention (report to yourdoctor or health care professional if they continue or are bothersome): constipation diarrhea nausea, vomiting pain or redness at the injection site unusually weak or tired This list may not describe all possible side effects. Call your doctor for medical advice about side effects. You may report side effects to FDA at1-800-FDA-1088. Where should I keep my medication? This drug is given in a hospital or clinic and will not be stored at home. NOTE: This sheet is a summary. It may not cover all possible information. If you have questions about this medicine, talk to your doctor, pharmacist, orhealth care provider.  2022 Elsevier/Gold Standard (2016-12-15 14:37:51)  

## 2021-05-19 ENCOUNTER — Telehealth: Payer: Self-pay | Admitting: *Deleted

## 2021-05-19 ENCOUNTER — Other Ambulatory Visit: Payer: Self-pay

## 2021-05-19 ENCOUNTER — Inpatient Hospital Stay: Payer: Self-pay

## 2021-05-19 ENCOUNTER — Encounter: Payer: Self-pay | Admitting: Hematology & Oncology

## 2021-05-19 ENCOUNTER — Inpatient Hospital Stay: Payer: Self-pay | Admitting: Hematology & Oncology

## 2021-05-19 VITALS — BP 93/52 | HR 72 | Temp 97.9°F | Resp 16

## 2021-05-19 DIAGNOSIS — D461 Refractory anemia with ring sideroblasts: Secondary | ICD-10-CM

## 2021-05-19 DIAGNOSIS — D649 Anemia, unspecified: Secondary | ICD-10-CM

## 2021-05-19 DIAGNOSIS — D696 Thrombocytopenia, unspecified: Secondary | ICD-10-CM

## 2021-05-19 DIAGNOSIS — D46Z Other myelodysplastic syndromes: Secondary | ICD-10-CM

## 2021-05-19 LAB — CMP (CANCER CENTER ONLY)
ALT: 15 U/L (ref 0–44)
AST: 13 U/L — ABNORMAL LOW (ref 15–41)
Albumin: 3.9 g/dL (ref 3.5–5.0)
Alkaline Phosphatase: 103 U/L (ref 38–126)
Anion gap: 5 (ref 5–15)
BUN: 20 mg/dL (ref 6–20)
CO2: 30 mmol/L (ref 22–32)
Calcium: 10.2 mg/dL (ref 8.9–10.3)
Chloride: 98 mmol/L (ref 98–111)
Creatinine: 0.77 mg/dL (ref 0.61–1.24)
GFR, Estimated: >60 mL/min (ref >60)
Glucose, Bld: 96 mg/dL (ref 70–99)
Potassium: 4.4 mmol/L (ref 3.5–5.1)
Sodium: 133 mmol/L — ABNORMAL LOW (ref 135–145)
Total Bilirubin: 0.9 mg/dL (ref 0.3–1.2)
Total Protein: 8.3 g/dL — ABNORMAL HIGH (ref 6.5–8.1)

## 2021-05-19 LAB — CBC WITH DIFFERENTIAL (CANCER CENTER ONLY)
Abs Immature Granulocytes: 0.01 10*3/uL (ref 0.00–0.07)
Basophils Absolute: 0 10*3/uL (ref 0.0–0.1)
Basophils Relative: 0 %
Eosinophils Absolute: 0 10*3/uL (ref 0.0–0.5)
Eosinophils Relative: 1 %
HCT: 18.2 % — ABNORMAL LOW (ref 39.0–52.0)
Hemoglobin: 5.8 g/dL — CL (ref 13.0–17.0)
Immature Granulocytes: 1 %
Lymphocytes Relative: 78 %
Lymphs Abs: 1.4 10*3/uL (ref 0.7–4.0)
MCH: 27.9 pg (ref 26.0–34.0)
MCHC: 31.9 g/dL (ref 30.0–36.0)
MCV: 87.5 fL (ref 80.0–100.0)
Monocytes Absolute: 0.1 10*3/uL (ref 0.1–1.0)
Monocytes Relative: 5 %
Neutro Abs: 0.3 10*3/uL — CL (ref 1.7–7.7)
Neutrophils Relative %: 15 %
Platelet Count: 39 10*3/uL — ABNORMAL LOW (ref 150–400)
RBC: 2.08 MIL/uL — ABNORMAL LOW (ref 4.22–5.81)
RDW: 25.2 % — ABNORMAL HIGH (ref 11.5–15.5)
WBC Count: 1.7 10*3/uL — ABNORMAL LOW (ref 4.0–10.5)
nRBC: 7.6 % — ABNORMAL HIGH (ref 0.0–0.2)

## 2021-05-19 LAB — IRON AND TIBC
Iron: 224 ug/dL — ABNORMAL HIGH (ref 45–182)
Saturation Ratios: 86 % — ABNORMAL HIGH (ref 17.9–39.5)
TIBC: 261 ug/dL (ref 250–450)
UIBC: 37 ug/dL

## 2021-05-19 LAB — LACTATE DEHYDROGENASE: LDH: 141 U/L (ref 98–192)

## 2021-05-19 LAB — FERRITIN: Ferritin: 864 ng/mL — ABNORMAL HIGH (ref 24–336)

## 2021-05-19 LAB — SAMPLE TO BLOOD BANK

## 2021-05-19 LAB — PREPARE RBC (CROSSMATCH)

## 2021-05-19 MED ORDER — DIPHENHYDRAMINE HCL 25 MG PO CAPS
25.0000 mg | ORAL_CAPSULE | Freq: Once | ORAL | Status: AC
  Administered 2021-05-19: 25 mg via ORAL

## 2021-05-19 MED ORDER — ACETAMINOPHEN 325 MG PO TABS
650.0000 mg | ORAL_TABLET | Freq: Once | ORAL | Status: AC
  Administered 2021-05-19: 650 mg via ORAL

## 2021-05-19 MED ORDER — HEPARIN SOD (PORK) LOCK FLUSH 100 UNIT/ML IV SOLN
500.0000 [IU] | Freq: Every day | INTRAVENOUS | Status: DC | PRN
  Filled 2021-05-19: qty 5

## 2021-05-19 MED ORDER — DIPHENHYDRAMINE HCL 25 MG PO CAPS
ORAL_CAPSULE | ORAL | Status: AC
  Filled 2021-05-19: qty 1

## 2021-05-19 MED ORDER — ACETAMINOPHEN 325 MG PO TABS
ORAL_TABLET | ORAL | Status: AC
  Filled 2021-05-19: qty 2

## 2021-05-19 NOTE — Telephone Encounter (Signed)
Dr. Marin Olp notified of HGB-5.8.  Order received for pt to get 2 units of PRBC's per Dr. Marin Olp.

## 2021-05-19 NOTE — Telephone Encounter (Addendum)
Dr. Marin Olp notified of HGB-5.8.  Orders received for pt to get 2 units of PRBC's per Dr. Marin Olp. Duplicate note.

## 2021-05-19 NOTE — Patient Instructions (Signed)
https://www.redcrossblood.org/donate-blood/blood-donation-process/what-happens-to-donated-blood/blood-transfusions/types-of-blood-transfusions.html"> https://www.hematology.org/education/patients/blood-basics/blood-safety-and-matching"> https://www.nhlbi.nih.gov/health-topics/blood-transfusion">  Blood Transfusion, Adult A blood transfusion is a procedure in which you receive blood or a type of blood cell (blood component) through an IV. You may need a blood transfusion when your blood level is low. This may result from a bleeding disorder, illness, injury, or surgery. The blood may come from a donor. You may also be able to donate blood for yourself (autologous blood donation) before a planned surgery. The blood given in a transfusion is made up of different blood components. You may receive: Red blood cells. These carry oxygen to the cells in the body. Platelets. These help your blood to clot. Plasma. This is the liquid part of your blood. It carries proteins and other substances throughout the body. White blood cells. These help you fight infections. If you have hemophilia or another clotting disorder, you may also receive othertypes of blood products. Tell a health care provider about: Any blood disorders you have. Any previous reactions you have had during a blood transfusion. Any allergies you have. All medicines you are taking, including vitamins, herbs, eye drops, creams, and over-the-counter medicines. Any surgeries you have had. Any medical conditions you have, including any recent fever or cold symptoms. Whether you are pregnant or may be pregnant. What are the risks? Generally, this is a safe procedure. However, problems may occur. The most common problems include: A mild allergic reaction, such as red, swollen areas of skin (hives) and itching. Fever or chills. This may be the body's response to new blood cells received. This may occur during or up to 4 hours after the  transfusion. More serious problems may include: Transfusion-associated circulatory overload (TACO), or too much fluid in the lungs. This may cause breathing problems. A serious allergic reaction, such as difficulty breathing or swelling around the face and lips. Transfusion-related acute lung injury (TRALI), which causes breathing difficulty and low oxygen in the blood. This can occur within hours of the transfusion or several days later. Iron overload. This can happen after receiving many blood transfusions over a period of time. Infection or virus being transmitted. This is rare because donated blood is carefully tested before it is given. Hemolytic transfusion reaction. This is rare. It happens when your body's defense system (immune system)tries to attack the new blood cells. Symptoms may include fever, chills, nausea, low blood pressure, and low back or chest pain. Transfusion-associated graft-versus-host disease (TAGVHD). This is rare. It happens when donated cells attack your body's healthy tissues. What happens before the procedure? Medicines Ask your health care provider about: Changing or stopping your regular medicines. This is especially important if you are taking diabetes medicines or blood thinners. Taking medicines such as aspirin and ibuprofen. These medicines can thin your blood. Do not take these medicines unless your health care provider tells you to take them. Taking over-the-counter medicines, vitamins, herbs, and supplements. General instructions Follow instructions from your health care provider about eating and drinking restrictions. You will have a blood test to determine your blood type. This is necessary to know what kind of blood your body will accept and to match it to the donor blood. If you are going to have a planned surgery, you may be able to do an autologous blood donation. This may be done in case you need to have a transfusion. You will have your temperature,  blood pressure, and pulse monitored before the transfusion. If you have had an allergic reaction to a transfusion in the past, you may be given   medicine to help prevent a reaction. This medicine may be given to you by mouth (orally) or through an IV. Set aside time for the blood transfusion. This procedure generally takes 1-4 hours to complete. What happens during the procedure?  An IV will be inserted into one of your veins. The bag of donated blood will be attached to your IV. The blood will then enter through your vein. Your temperature, blood pressure, and pulse will be monitored regularly during the transfusion. This monitoring is done to detect early signs of a transfusion reaction. Tell your nurse right away if you have any of these symptoms during the transfusion: Shortness of breath or trouble breathing. Chest or back pain. Fever or chills. Hives or itching. If you have any signs or symptoms of a reaction, your transfusion will be stopped and you may be given medicine. When the transfusion is complete, your IV will be removed. Pressure may be applied to the IV site for a few minutes. A bandage (dressing)will be applied. The procedure may vary among health care providers and hospitals. What happens after the procedure? Your temperature, blood pressure, pulse, breathing rate, and blood oxygen level will be monitored until you leave the hospital or clinic. Your blood may be tested to see how you are responding to the transfusion. You may be warmed with fluids or blankets to maintain a normal body temperature. If you receive your blood transfusion in an outpatient setting, you will be told whom to contact to report any reactions. Where to find more information For more information on blood transfusions, visit the American Red Cross: redcross.org Summary A blood transfusion is a procedure in which you receive blood or a type of blood cell (blood component) through an IV. The blood you  receive may come from a donor or be donated by yourself (autologous blood donation) before a planned surgery. The blood given in a transfusion is made up of different blood components. You may receive red blood cells, platelets, plasma, or white blood cells depending on the condition treated. Your temperature, blood pressure, and pulse will be monitored before, during, and after the transfusion. After the transfusion, your blood may be tested to see how your body has responded. This information is not intended to replace advice given to you by your health care provider. Make sure you discuss any questions you have with your healthcare provider. Document Revised: 09/21/2019 Document Reviewed: 05/11/2019 Elsevier Patient Education  2022 Elsevier Inc.  

## 2021-05-20 ENCOUNTER — Ambulatory Visit: Payer: Self-pay | Admitting: Hematology & Oncology

## 2021-05-20 LAB — TYPE AND SCREEN
ABO/RH(D): O POS
Antibody Screen: NEGATIVE
Unit division: 0
Unit division: 0

## 2021-05-20 LAB — BPAM RBC
Blood Product Expiration Date: 202207232359
Blood Product Expiration Date: 202207242359
ISSUE DATE / TIME: 202206201209
ISSUE DATE / TIME: 202206201209
Unit Type and Rh: 5100
Unit Type and Rh: 5100

## 2021-05-23 ENCOUNTER — Ambulatory Visit: Payer: PRIVATE HEALTH INSURANCE | Admitting: Student

## 2021-05-25 NOTE — Progress Notes (Signed)
Primary Physician/Referring:  Cathleen Corti, PA-C  Patient ID: Thomas Frazier, male    DOB: 1966-08-23, 55 y.o.   MRN: [id number]  Chief Complaint  Patient presents with   Chronic combined systolic and diastolic heart failure    Follow-up   Hospitalization Follow-up   HPI:    Thomas Frazier  is a 55 y.o.  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  Patient denies significant cardiovascular risk factors.  No history of hypertension, hyperlipidemia, diabetes, no history of premature CAD. No known history of CAD.  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  Guideline directed medical therapy was initiated including carvedilol and Entresto, as well as digoxin and Lasix.  Patient responded well to diuresis.  Suspect severe aortic stenosis to be etiology of cardiomyopathy and had no significant troponin leak suspect this is chronic.  Patient presents for 4 week follow up of heart failure and aortic stenosis.  On last visit reduced Entresto 24/26 mg half tablet twice daily.  Following last visit patient called the office with concerns that he continued to have dizziness and low blood pressure.  He was advised to also reduce carvedilol in half as well, however he did not do so.  Patient is presently taking Entresto 4/20 mg half tablet twice daily and carvedilol 3.125 mg twice daily.  Blood pressure remains soft, however patient is asymptomatic and has had no recurrence of dizziness or lightheadedness.  Patient states he is feeling the best he has since discharge from the hospital.  He continues to follow closely with [facility] oncology and is currently being evaluated for bone  marrow transplant.  He is slowly been increasing his physical activity without issue.  He received blood transfusion on 05/05/2021, which improved his fatigue and dizziness as well.  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   Past Surgical History:  Procedure Laterality Date   APPENDECTOMY     Family History  Problem Relation Age of Onset   Hiatal hernia Mother        Erupted inside her body   Healthy Sister    Healthy Brother    Healthy Sister    Hyperlipidemia Brother    Hyperlipidemia Brother     Social History   Tobacco Use   Smoking status: Never   Smokeless tobacco: Never  Substance Use Topics   Alcohol use: Not Currently   Marital Status: Married   ROS  Review of Systems  Cardiovascular:  Negative for chest pain, claudication, leg swelling, near-syncope, orthopnea, palpitations, paroxysmal nocturnal dyspnea and syncope.  Respiratory:  Negative for shortness of breath.   Hematologic/Lymphatic: Does not bruise/bleed easily.  Gastrointestinal:  Negative for melena.  Neurological:  Negative for dizziness (resolved).   Objective  Blood pressure (!) 108/58, pulse 90, temperature 98.7 F (37.1 C), weight 151 lb (68.5 kg), SpO2 98 %.  Vitals with BMI 05/27/2021 05/19/2021 05/19/2021  Height - - -  Weight 151 lbs - -  BMI - - -  Systolic 326 93 95  Diastolic 58 52 54  Pulse 90 72 85      Physical Exam Vitals reviewed.  Constitutional:      Comments: Appears older than stated age.  Cardiovascular:     Rate and Rhythm: Normal rate and regular rhythm.     Pulses: Intact distal pulses.          Carotid pulses are  on the right side with bruit and  on the left side with bruit.    Heart sounds: S1 normal and S2 normal. Murmur heard.  Harsh midsystolic murmur is present with a grade of 3/6 at the upper right  sternal border radiating to the neck.    No gallop.  Pulmonary:     Effort: Pulmonary effort is normal. No respiratory distress.     Breath sounds: No wheezing, rhonchi or rales.  Musculoskeletal:     Right lower leg: No edema.     Left lower leg: No edema.  Skin:    General: Skin is warm and dry.  Neurological:     Mental Status: He is alert.    Laboratory examination:   Recent Labs    05/12/21 0957 05/19/21 0946 05/26/21 1001  NA 134* 133* 137  K 4.2 4.4 4.2  CL 101 98 101  CO2 28 30 31   GLUCOSE 111* 96 85  BUN 24* 20 23*  CREATININE 0.70 0.77 0.68  CALCIUM 9.9 10.2 10.0  GFRNONAA >60 >60 >60   estimated creatinine clearance is 98.7 mL/min (by C-G formula based on SCr of 0.68 mg/dL).  CMP Latest Ref Rng & Units 05/26/2021 05/19/2021 05/12/2021  Glucose 70 - 99 mg/dL 85 96 111(H)  BUN 6 - 20 mg/dL 23(H) 20 24(H)  Creatinine 0.61 - 1.24 mg/dL 0.68 0.77 0.70  Sodium 135 - 145 mmol/L 137 133(L) 134(L)  Potassium 3.5 - 5.1 mmol/L 4.2 4.4 4.2  Chloride 98 - 111 mmol/L 101 98 101  CO2 22 - 32 mmol/L 31 30 28   Calcium 8.9 - 10.3 mg/dL 10.0 10.2 9.9  Total Protein 6.5 - 8.1 g/dL 8.2(H) 8.3(H) 7.5  Total Bilirubin 0.3 - 1.2 mg/dL 0.8 0.9 0.6  Alkaline Phos 38 - 126 U/L 131(H) 103 77  AST 15 - 41 U/L 13(L) 13(L) 16  ALT 0 - 44 U/L 14 15 19    CBC Latest Ref Rng & Units 05/26/2021 05/19/2021 05/12/2021  WBC 4.0 - 10.5 K/uL 1.6(L) 1.7(L) 1.7(L)  Hemoglobin 13.0 - 17.0 g/dL 8.9(L) 5.8(LL) 7.2(L)  Hematocrit 39.0 - 52.0 % 28.1(L) 18.2(L) 22.3(L)  Platelets 150 - 400 K/uL 109(L) 39(L) 41(L)    Lipid Panel No results for input(s): CHOL, TRIG, LDLCALC, VLDL, HDL, CHOLHDL, LDLDIRECT in the last 8760 hours.  HEMOGLOBIN A1C No results found for: HGBA1C, MPG TSH No results for input(s): TSH in the last 8760 hours.  External labs:  None   Allergies   Allergies  Allergen Reactions   Ciprofloxacin Rash   Diflucan [Fluconazole] Rash   Famvir [Famciclovir] Rash    Medications  Prior to Visit:   Outpatient Medications Prior to Visit  Medication Sig Dispense Refill   azaCITIDine (VIDAZA) 100 MG SUSR Vidaza 142.5 mg (rounded from 143.25 mg)=75 mg/m2 x 1.91 m2 daily times five days every twenty eight days (Patient taking differently: Inject 100 mg into the vein See admin instructions. Vidaza 142.5 mg (rounded from 143.25 mg)=75 mg/m2 x 1.91 m2 daily times five days every twenty eight days) 1 each 0   carvedilol (COREG) 3.125 MG tablet Take 1 tablet (3.125 mg total) by mouth 2 (two) times daily with a meal. 180 tablet 3  digoxin (LANOXIN) 0.25 MG tablet Take 1 tablet (0.25 mg total) by mouth daily. 90 tablet 3   Multiple Vitamins-Iron (MULTIVITAMIN/IRON PO) Take 1 tablet by mouth daily.     prochlorperazine (COMPAZINE) 10 MG tablet Take 1 tablet (10 mg total) by mouth every 6 (six) hours as needed (Nausea or vomiting). (Patient taking differently: Take 10 mg by mouth every 6 (six) hours as needed for nausea or vomiting (Nausea or vomiting).) 30 tablet 1   traMADol (ULTRAM) 50 MG tablet Take 2 tablets (100 mg total) by mouth every 6 (six) hours as needed. 1-2 tablets (50mg - 100mg ) by mouth every 6 hours as needed 60 tablet 0   sacubitril-valsartan (ENTRESTO) 24-26 MG Take 0.5 tablets by mouth 2 (two) times daily. 60 tablet 1   megestrol (MEGACE) 40 MG/ML suspension Take 10 mLs (400 mg total) by mouth 2 (two) times daily. (Patient not taking: Reported on 05/27/2021) 480 mL 4   melatonin 5 MG TABS Take 5 mg by mouth at bedtime as needed. (Patient not taking: Reported on 05/27/2021)     No facility-administered medications prior to visit.   Final Medications at End of Visit    Current Meds  Medication Sig   azaCITIDine (VIDAZA) 100 MG SUSR Vidaza 142.5 mg (rounded from 143.25 mg)=75 mg/m2 x 1.91 m2 daily times five days every twenty eight days (Patient taking differently: Inject 100 mg into the vein See admin instructions. Vidaza 142.5 mg (rounded from 143.25 mg)=75 mg/m2 x 1.91  m2 daily times five days every twenty eight days)   carvedilol (COREG) 3.125 MG tablet Take 1 tablet (3.125 mg total) by mouth 2 (two) times daily with a meal.   digoxin (LANOXIN) 0.25 MG tablet Take 1 tablet (0.25 mg total) by mouth daily.   ivabradine (CORLANOR) 5 MG TABS tablet Take 1 tablet (5 mg total) by mouth 2 (two) times daily with a meal.   Multiple Vitamins-Iron (MULTIVITAMIN/IRON PO) Take 1 tablet by mouth daily.   prochlorperazine (COMPAZINE) 10 MG tablet Take 1 tablet (10 mg total) by mouth every 6 (six) hours as needed (Nausea or vomiting). (Patient taking differently: Take 10 mg by mouth every 6 (six) hours as needed for nausea or vomiting (Nausea or vomiting).)   traMADol (ULTRAM) 50 MG tablet Take 2 tablets (100 mg total) by mouth every 6 (six) hours as needed. 1-2 tablets (50mg - 100mg ) by mouth every 6 hours as needed   [DISCONTINUED] sacubitril-valsartan (ENTRESTO) 24-26 MG Take 0.5 tablets by mouth 2 (two) times daily.   Radiology:   No results found. CT angio chest 03/26/2021: 1. No pulmonary embolus. 2. Patchy, ground-glass, and confluent opacities in the right greater than left lower lobe. There also subpleural opacities involving the anterior left greater than right upper lobes. Findings are suspicious for multifocal pneumonia. Recommend radiographic follow-up to resolution. 3. Prominent right greater than left hilar nodes and prominent subcarinal node, likely reactive. 4. Borderline cardiomegaly.  Coronary artery calcifications. Aortic Atherosclerosis (ICD10-I70.0).  Chest x-ray 03/29/2021: Persistent bibasilar opacities without substantial change. No significant pleural effusion. No pneumothorax. Stable cardiomediastinal contours.  Cardiac Studies:   Echocardiogram 03/27/2021:  1. Left ventricular ejection fraction, by estimation, is 45 to 50%. The  left ventricle has mildly decreased function. The left ventricle  demonstrates global hypokinesis. The left  ventricular internal cavity size  was mildly dilated. Left ventricular  diastolic parameters are consistent with Grade II diastolic dysfunction  (pseudonormalization). Elevated left atrial pressure.   2. Right ventricular systolic function is normal.  The right ventricular  size is mildly enlarged. There is mildly elevated pulmonary artery  systolic pressure. The estimated right ventricular systolic pressure is  91.4 mmHg.   3. Left atrial size was mildly dilated.   4. The mitral valve is grossly normal. Trivial mitral valve  regurgitation. No evidence of mitral stenosis.   5. The aortic valve is tricuspid. There is moderate calcification of the  aortic valve. There is moderate thickening of the aortic valve. Aortic  valve regurgitation is mild to moderate. Moderate aortic valve stenosis.  Aortic valve area, by VTI measures  1.28 cm. Aortic valve mean gradient measures 23.3 mmHg. Aortic valve Vmax  measures 3.12 m/s.   6. The inferior vena cava is normal in size with greater than 50%  respiratory variability, suggesting right atrial pressure of 3 mmHg.  Upon personal review by Dr. Einar Gip felt LVEF to be 30-35% as opposed to 45-50%.    EKG:   EKG 04/11/2021: Sinus rhythm at a rate of 90 bpm.  Biatrial enlargement.  Normal axis.  LVH with secondary ST-T wave changes.  EKG 03/19/2021: Sinus tachycardia with single PVC at a rate of 106 bpm.  Normal axis.  Nonspecific T wave abnormality.  Assessment     ICD-10-CM   1. Chronic combined systolic and diastolic heart failure (HCC)  I50.42     2. Severe aortic stenosis  I35.0        Medications Discontinued During This Encounter  Medication Reason   megestrol (MEGACE) 40 MG/ML suspension Error   melatonin 5 MG TABS Error    Meds ordered this encounter  Medications   ivabradine (CORLANOR) 5 MG TABS tablet    Sig: Take 1 tablet (5 mg total) by mouth 2 (two) times daily with a meal.    Dispense:  60 tablet    Refill:  1     Recommendations:   Thomas Frazier is a 55 y.o. 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  Patient denies significant cardiovascular risk factors.  No history of hypertension, hyperlipidemia, diabetes, no history of premature CAD. No known history of CAD.  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  Guideline directed medical therapy was initiated including carvedilol and Entresto, as well as digoxin and Lasix.  Patient responded well to diuresis.  Suspected severe aortic stenosis to be etiology of cardiomyopathy and has no significant troponin leak suspect this is chronic.  Patient presents for 4 week follow up of heart failure and aortic stenosis.  On last visit reduced Entresto 24/26 mg half tablet twice daily.  Following last visit patient called the office with concerns that he continued to have dizziness and low blood pressure.  He was advised to also reduce carvedilol in half as well, however he did not do so.  Patient is presently taking Entresto 4/20 mg half tablet twice daily and carvedilol 3.125 mg twice daily.  Patient's blood pressure mains soft, however with reduction of Entresto and recent blood transfusion he has had no recurrence of symptomatic hypotension.  Patient's heart rate remains >70 bpm, therefore we will add Corlanor 5 mg twice daily.  We will not make any other changes to medical therapy at this time.  We will plan to repeat echocardiogram following next visit.  In regard to  severe aortic stenosis, will not pursue evaluation for surgical intervention at this time given heart failure and oncology prognosis would pursue medical optimization at this  time.  Follow up in 4 weeks, sooner if needed, for heart failure and medication titration.    [name], PA-C 05/28/2021, 10:26 AM Office: [phone]

## 2021-05-26 ENCOUNTER — Other Ambulatory Visit: Payer: Self-pay

## 2021-05-26 ENCOUNTER — Inpatient Hospital Stay: Payer: Self-pay

## 2021-05-26 ENCOUNTER — Telehealth: Payer: Self-pay | Admitting: *Deleted

## 2021-05-26 DIAGNOSIS — D46Z Other myelodysplastic syndromes: Secondary | ICD-10-CM

## 2021-05-26 DIAGNOSIS — D649 Anemia, unspecified: Secondary | ICD-10-CM

## 2021-05-26 DIAGNOSIS — D461 Refractory anemia with ring sideroblasts: Secondary | ICD-10-CM

## 2021-05-26 DIAGNOSIS — D696 Thrombocytopenia, unspecified: Secondary | ICD-10-CM

## 2021-05-26 LAB — CBC WITH DIFFERENTIAL (CANCER CENTER ONLY)
Abs Immature Granulocytes: 0 10*3/uL (ref 0.00–0.07)
Basophils Absolute: 0 10*3/uL (ref 0.0–0.1)
Basophils Relative: 1 %
Eosinophils Absolute: 0 10*3/uL (ref 0.0–0.5)
Eosinophils Relative: 1 %
HCT: 28.1 % — ABNORMAL LOW (ref 39.0–52.0)
Hemoglobin: 8.9 g/dL — ABNORMAL LOW (ref 13.0–17.0)
Immature Granulocytes: 0 %
Lymphocytes Relative: 91 %
Lymphs Abs: 1.4 10*3/uL (ref 0.7–4.0)
MCH: 29.6 pg (ref 26.0–34.0)
MCHC: 31.7 g/dL (ref 30.0–36.0)
MCV: 93.4 fL (ref 80.0–100.0)
Monocytes Absolute: 0 10*3/uL — ABNORMAL LOW (ref 0.1–1.0)
Monocytes Relative: 2 %
Neutro Abs: 0.1 10*3/uL — CL (ref 1.7–7.7)
Neutrophils Relative %: 5 %
Platelet Count: 109 10*3/uL — ABNORMAL LOW (ref 150–400)
RBC: 3.01 MIL/uL — ABNORMAL LOW (ref 4.22–5.81)
RDW: 22.5 % — ABNORMAL HIGH (ref 11.5–15.5)
WBC Count: 1.6 10*3/uL — ABNORMAL LOW (ref 4.0–10.5)
nRBC: 3.8 % — ABNORMAL HIGH (ref 0.0–0.2)

## 2021-05-26 LAB — CMP (CANCER CENTER ONLY)
ALT: 14 U/L (ref 0–44)
AST: 13 U/L — ABNORMAL LOW (ref 15–41)
Albumin: 4.1 g/dL (ref 3.5–5.0)
Alkaline Phosphatase: 131 U/L — ABNORMAL HIGH (ref 38–126)
Anion gap: 5 (ref 5–15)
BUN: 23 mg/dL — ABNORMAL HIGH (ref 6–20)
CO2: 31 mmol/L (ref 22–32)
Calcium: 10 mg/dL (ref 8.9–10.3)
Chloride: 101 mmol/L (ref 98–111)
Creatinine: 0.68 mg/dL (ref 0.61–1.24)
GFR, Estimated: >60 mL/min (ref >60)
Glucose, Bld: 85 mg/dL (ref 70–99)
Potassium: 4.2 mmol/L (ref 3.5–5.1)
Sodium: 137 mmol/L (ref 135–145)
Total Bilirubin: 0.8 mg/dL (ref 0.3–1.2)
Total Protein: 8.2 g/dL — ABNORMAL HIGH (ref 6.5–8.1)

## 2021-05-26 LAB — SAMPLE TO BLOOD BANK

## 2021-05-26 NOTE — Telephone Encounter (Signed)
Critical ANC .1 reported by [facility] in lab.  Dr Marin Olp aware. No orders received

## 2021-05-27 ENCOUNTER — Ambulatory Visit: Payer: PRIVATE HEALTH INSURANCE | Admitting: Student

## 2021-05-27 ENCOUNTER — Telehealth: Payer: Self-pay

## 2021-05-27 ENCOUNTER — Encounter: Payer: Self-pay | Admitting: Student

## 2021-05-27 ENCOUNTER — Other Ambulatory Visit: Payer: Self-pay

## 2021-05-27 VITALS — BP 108/58 | HR 90 | Temp 98.7°F | Wt 151.0 lb

## 2021-05-27 DIAGNOSIS — I5042 Chronic combined systolic (congestive) and diastolic (congestive) heart failure: Secondary | ICD-10-CM

## 2021-05-27 DIAGNOSIS — I350 Nonrheumatic aortic (valve) stenosis: Secondary | ICD-10-CM

## 2021-05-27 MED ORDER — SACUBITRIL-VALSARTAN 24-26 MG PO TABS: 0.5000 | ORAL_TABLET | Freq: Two times a day (BID) | ORAL | 3 refills | Status: DC

## 2021-05-27 MED ORDER — IVABRADINE HCL 5 MG PO TABS: 5.0000 mg | ORAL_TABLET | Freq: Two times a day (BID) | ORAL | 1 refills | Status: DC

## 2021-05-27 NOTE — Telephone Encounter (Signed)
Patient assistance for entresto faxed

## 2021-05-27 NOTE — Telephone Encounter (Deleted)
Patient came in the office due to his [employer] monitor falling off, I took off his old one and put on a new one and asked him to mail the old one back. I notified julie at [employer] as well.

## 2021-05-27 NOTE — Telephone Encounter (Signed)
Done in error. Please disregard.

## 2021-06-04 ENCOUNTER — Encounter: Payer: Self-pay | Admitting: Hematology & Oncology

## 2021-06-04 ENCOUNTER — Inpatient Hospital Stay (HOSPITAL_BASED_OUTPATIENT_CLINIC_OR_DEPARTMENT_OTHER): Payer: Self-pay | Admitting: Hematology & Oncology

## 2021-06-04 ENCOUNTER — Other Ambulatory Visit: Payer: Self-pay

## 2021-06-04 ENCOUNTER — Inpatient Hospital Stay: Payer: Self-pay | Attending: Family

## 2021-06-04 ENCOUNTER — Inpatient Hospital Stay: Payer: Self-pay

## 2021-06-04 VITALS — BP 106/50 | HR 82 | Temp 98.6°F | Resp 18 | Wt 150.0 lb

## 2021-06-04 DIAGNOSIS — I350 Nonrheumatic aortic (valve) stenosis: Secondary | ICD-10-CM | POA: Insufficient documentation

## 2021-06-04 DIAGNOSIS — D649 Anemia, unspecified: Secondary | ICD-10-CM

## 2021-06-04 DIAGNOSIS — D696 Thrombocytopenia, unspecified: Secondary | ICD-10-CM

## 2021-06-04 DIAGNOSIS — D461 Refractory anemia with ring sideroblasts: Secondary | ICD-10-CM | POA: Insufficient documentation

## 2021-06-04 DIAGNOSIS — D46Z Other myelodysplastic syndromes: Secondary | ICD-10-CM

## 2021-06-04 DIAGNOSIS — Z5111 Encounter for antineoplastic chemotherapy: Secondary | ICD-10-CM | POA: Insufficient documentation

## 2021-06-04 DIAGNOSIS — R5383 Other fatigue: Secondary | ICD-10-CM | POA: Insufficient documentation

## 2021-06-04 DIAGNOSIS — Z79899 Other long term (current) drug therapy: Secondary | ICD-10-CM | POA: Insufficient documentation

## 2021-06-04 DIAGNOSIS — R0602 Shortness of breath: Secondary | ICD-10-CM | POA: Insufficient documentation

## 2021-06-04 DIAGNOSIS — M2550 Pain in unspecified joint: Secondary | ICD-10-CM | POA: Insufficient documentation

## 2021-06-04 LAB — CBC WITH DIFFERENTIAL (CANCER CENTER ONLY)
Abs Immature Granulocytes: 0 10*3/uL (ref 0.00–0.07)
Basophils Absolute: 0 10*3/uL (ref 0.0–0.1)
Basophils Relative: 0 %
Eosinophils Absolute: 0 10*3/uL (ref 0.0–0.5)
Eosinophils Relative: 1 %
HCT: 22.9 % — ABNORMAL LOW (ref 39.0–52.0)
Hemoglobin: 7.3 g/dL — ABNORMAL LOW (ref 13.0–17.0)
Immature Granulocytes: 0 %
Lymphocytes Relative: 92 %
Lymphs Abs: 2 10*3/uL (ref 0.7–4.0)
MCH: 31.5 pg (ref 26.0–34.0)
MCHC: 31.9 g/dL (ref 30.0–36.0)
MCV: 98.7 fL (ref 80.0–100.0)
Monocytes Absolute: 0 10*3/uL — ABNORMAL LOW (ref 0.1–1.0)
Monocytes Relative: 2 %
Neutro Abs: 0.1 10*3/uL — CL (ref 1.7–7.7)
Neutrophils Relative %: 5 %
Platelet Count: 178 10*3/uL (ref 150–400)
RBC: 2.32 MIL/uL — ABNORMAL LOW (ref 4.22–5.81)
RDW: 27.6 % — ABNORMAL HIGH (ref 11.5–15.5)
WBC Count: 2.1 10*3/uL — ABNORMAL LOW (ref 4.0–10.5)
nRBC: 2.9 % — ABNORMAL HIGH (ref 0.0–0.2)

## 2021-06-04 LAB — CMP (CANCER CENTER ONLY)
ALT: 12 U/L (ref 0–44)
AST: 12 U/L — ABNORMAL LOW (ref 15–41)
Albumin: 3.9 g/dL (ref 3.5–5.0)
Alkaline Phosphatase: 120 U/L (ref 38–126)
Anion gap: 6 (ref 5–15)
BUN: 19 mg/dL (ref 6–20)
CO2: 27 mmol/L (ref 22–32)
Calcium: 9.5 mg/dL (ref 8.9–10.3)
Chloride: 100 mmol/L (ref 98–111)
Creatinine: 0.66 mg/dL (ref 0.61–1.24)
GFR, Estimated: >60 mL/min (ref >60)
Glucose, Bld: 105 mg/dL — ABNORMAL HIGH (ref 70–99)
Potassium: 4.4 mmol/L (ref 3.5–5.1)
Sodium: 133 mmol/L — ABNORMAL LOW (ref 135–145)
Total Bilirubin: 0.4 mg/dL (ref 0.3–1.2)
Total Protein: 7.7 g/dL (ref 6.5–8.1)

## 2021-06-04 LAB — SAMPLE TO BLOOD BANK

## 2021-06-04 MED ORDER — ONDANSETRON HCL 8 MG PO TABS: 8.0000 mg | ORAL_TABLET | Freq: Once | ORAL | Status: DC

## 2021-06-04 MED ORDER — AZACITIDINE CHEMO SQ INJECTION
75.0000 mg/m2 | Freq: Once | INTRAMUSCULAR | Status: AC
  Administered 2021-06-04: 142.5 mg via SUBCUTANEOUS
  Filled 2021-06-04: qty 5.7

## 2021-06-04 MED ORDER — ONDANSETRON HCL 8 MG PO TABS
ORAL_TABLET | ORAL | Status: AC
  Filled 2021-06-04: qty 1

## 2021-06-04 NOTE — Patient Instructions (Signed)
Azacitidine suspension for injection (subcutaneous use) What is this medicine? AZACITIDINE (ay za SITE i deen) is a chemotherapy drug. This medicine reduces the growth of cancer cells and can suppress the immune system. It is used for treating myelodysplastic syndrome or some types of leukemia. This medicine may be used for other purposes; ask your health care provider or pharmacist if you have questions. COMMON BRAND NAME(S): Vidaza What should I tell my health care provider before I take this medicine? They need to know if you have any of these conditions:  kidney disease  liver disease  liver tumors  an unusual or allergic reaction to azacitidine, mannitol, other medicines, foods, dyes, or preservatives  pregnant or trying to get pregnant  breast-feeding How should I use this medicine? This medicine is for injection under the skin. It is administered in a hospital or clinic by a specially trained health care professional. Talk to your pediatrician regarding the use of this medicine in children. While this drug may be prescribed for selected conditions, precautions do apply. Overdosage: If you think you have taken too much of this medicine contact a poison control center or emergency room at once. NOTE: This medicine is only for you. Do not share this medicine with others. What if I miss a dose? It is important not to miss your dose. Call your doctor or health care professional if you are unable to keep an appointment. What may interact with this medicine? Interactions have not been studied. Give your health care provider a list of all the medicines, herbs, non-prescription drugs, or dietary supplements you use. Also tell them if you smoke, drink alcohol, or use illegal drugs. Some items may interact with your medicine. This list may not describe all possible interactions. Give your health care provider a list of all the medicines, herbs, non-prescription drugs, or dietary supplements  you use. Also tell them if you smoke, drink alcohol, or use illegal drugs. Some items may interact with your medicine. What should I watch for while using this medicine? Visit your doctor for checks on your progress. This drug may make you feel generally unwell. This is not uncommon, as chemotherapy can affect healthy cells as well as cancer cells. Report any side effects. Continue your course of treatment even though you feel ill unless your doctor tells you to stop. In some cases, you may be given additional medicines to help with side effects. Follow all directions for their use. Call your doctor or health care professional for advice if you get a fever, chills or sore throat, or other symptoms of a cold or flu. Do not treat yourself. This drug decreases your body's ability to fight infections. Try to avoid being around people who are sick. This medicine may increase your risk to bruise or bleed. Call your doctor or health care professional if you notice any unusual bleeding. You may need blood work done while you are taking this medicine. Do not become pregnant while taking this medicine and for 6 months after the last dose. Women should inform their doctor if they wish to become pregnant or think they might be pregnant. Men should not father a child while taking this medicine and for 3 months after the last dose. There is a potential for serious side effects to an unborn child. Talk to your health care professional or pharmacist for more information. Do not breast-feed an infant while taking this medicine and for 1 week after the last dose. This medicine may interfere with   the ability to have a child. Talk with your doctor or health care professional if you are concerned about your fertility. What side effects may I notice from receiving this medicine? Side effects that you should report to your doctor or health care professional as soon as possible:  allergic reactions like skin rash, itching or  hives, swelling of the face, lips, or tongue  low blood counts - this medicine may decrease the number of white blood cells, red blood cells and platelets. You may be at increased risk for infections and bleeding.  signs of infection - fever or chills, cough, sore throat, pain passing urine  signs of decreased platelets or bleeding - bruising, pinpoint red spots on the skin, black, tarry stools, blood in the urine  signs of decreased red blood cells - unusually weak or tired, fainting spells, lightheadedness  signs and symptoms of kidney injury like trouble passing urine or change in the amount of urine  signs and symptoms of liver injury like dark yellow or brown urine; general ill feeling or flu-like symptoms; light-colored stools; loss of appetite; nausea; right upper belly pain; unusually weak or tired; yellowing of the eyes or skin Side effects that usually do not require medical attention (report to your doctor or health care professional if they continue or are bothersome):  constipation  diarrhea  nausea, vomiting  pain or redness at the injection site  unusually weak or tired This list may not describe all possible side effects. Call your doctor for medical advice about side effects. You may report side effects to FDA at 1-800-FDA-1088. Where should I keep my medicine? This drug is given in a hospital or clinic and will not be stored at home. NOTE: This sheet is a summary. It may not cover all possible information. If you have questions about this medicine, talk to your doctor, pharmacist, or health care provider.  2021 Elsevier/Gold Standard (2016-12-15 14:37:51)  

## 2021-06-04 NOTE — Progress Notes (Signed)
Reviewed pt labs with Dr. Marin Olp and pt ok to treat with ANC 0.1

## 2021-06-05 ENCOUNTER — Other Ambulatory Visit: Payer: Self-pay | Admitting: *Deleted

## 2021-06-05 ENCOUNTER — Encounter: Payer: Self-pay | Admitting: Hematology & Oncology

## 2021-06-05 ENCOUNTER — Inpatient Hospital Stay: Payer: Self-pay

## 2021-06-05 ENCOUNTER — Telehealth: Payer: Self-pay | Admitting: Hematology & Oncology

## 2021-06-05 VITALS — BP 100/55 | HR 89 | Temp 98.8°F

## 2021-06-05 DIAGNOSIS — D649 Anemia, unspecified: Secondary | ICD-10-CM

## 2021-06-05 DIAGNOSIS — D46Z Other myelodysplastic syndromes: Secondary | ICD-10-CM

## 2021-06-05 LAB — PREPARE RBC (CROSSMATCH)

## 2021-06-05 MED ORDER — AZACITIDINE CHEMO SQ INJECTION
75.0000 mg/m2 | Freq: Once | INTRAMUSCULAR | Status: AC
  Administered 2021-06-05: 142.5 mg via SUBCUTANEOUS
  Filled 2021-06-05: qty 5.7

## 2021-06-05 NOTE — Telephone Encounter (Signed)
NO los 7/6

## 2021-06-05 NOTE — Progress Notes (Signed)
Hematology and Oncology Follow Up Visit  Thomas Frazier [id number] Aug 28, 1966 55 y.o. 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  Thankfully, he was walking in .  He is doing a little bit better.  He is still quite fatigued.  He apparently is going out to [facility] to be seen later on this month.  I think he told me that he was going to have a bone marrow biopsy done out there.  This would make a lot of sense.  It is hard to say how much different the bone marrow will be.  He still has marked anemia.  The thrombocytopenia certainly has resolved.  He is supposed to have an echocardiogram done at [facility] later on this month.  He has the aortic stenosis that is significant.  At some point, this is going to have to be fixed.  There is still no donor marrow for him yet.  This might be an issue.  Hopefully, a marrow will be able to be found for him.  He has had no bleeding.  He has had no fever.  There has been no nausea or vomiting.  His appetite is a little bit down.  His wife is doing a great job with him.  She is trying to feed him well.  She is trying to make sure that he stays safe.  He has had no cough.  There is no issues with bowels or bladder.  Currently, I would have said his performance status is ECOG 1.    Medications:  Allergies as of 06/04/2021       Reactions   Ciprofloxacin Rash   Diflucan [fluconazole] Rash   Famvir [famciclovir] Rash        Medication List        Accurate as of June 04, 2021 11:59 PM. If you have any questions, ask your nurse or doctor.          azaCITIDine 100 MG Susr Commonly known as: VIDAZA Vidaza 142.5 mg  (rounded from 143.25 mg)=75 mg/m2 x 1.91 m2 daily times five days every twenty eight days What changed:  how much to take how to take this when to take this   carvedilol 3.125 MG tablet Commonly known as: COREG Take 1 tablet (3.125 mg total) by mouth 2 (two) times daily with a meal.   digoxin 0.25 MG tablet Commonly known as: LANOXIN Take 1 tablet (0.25 mg total) by mouth daily.   ivabradine 5 MG Tabs tablet Commonly known as: Corlanor Take 1 tablet (5 mg total) by mouth 2 (two) times daily with a meal.   MULTIVITAMIN/IRON PO Take 1 tablet by mouth daily.   prochlorperazine 10 MG tablet Commonly known as: COMPAZINE Take 1 tablet (10 mg total) by mouth every 6 (six) hours as needed (Nausea or vomiting). What changed: reasons to take this   sacubitril-valsartan 24-26 MG Commonly known as: ENTRESTO Take 0.5 tablets by mouth 2 (two) times daily.   traMADol 50 MG tablet Commonly known as: ULTRAM Take 2 tablets (100 mg total) by mouth every 6 (six) hours as needed. 1-2 tablets (17m- 1096m by mouth every 6 hours as needed  Allergies:  Allergies  Allergen Reactions   Ciprofloxacin Rash   Diflucan [Fluconazole] Rash   Famvir [Famciclovir] Rash    Past Medical History, Surgical history, Social history, and Family History were reviewed and updated.  Review of Systems: Review of Systems  Constitutional:  Positive for malaise/fatigue. Negative for diaphoresis.  HENT: Negative.    Eyes: Negative.   Respiratory:  Positive for shortness of breath.   Cardiovascular: Negative.   Gastrointestinal: Negative.   Genitourinary: Negative.   Musculoskeletal:  Positive for joint pain.  Skin: Negative.   Neurological: Negative.   Endo/Heme/Allergies: Negative.   Psychiatric/Behavioral: Negative.      Physical Exam:  weight is 150 lb (68 kg). His oral temperature is 98.6 F (37 C). His blood pressure is 106/50 (abnormal) and his pulse is 82. His respiration is 18 and  oxygen saturation is 100%.   Wt Readings from Last 3 Encounters:  06/04/21 150 lb (68 kg)  05/27/21 151 lb (68.5 kg)  05/19/21 151 lb 1.9 oz (68.5 kg)    Physical Exam Vitals reviewed.  HENT:     Head: Normocephalic and atraumatic.  Eyes:     Pupils: Pupils are equal, round, and reactive to light.  Cardiovascular:     Rate and Rhythm: Normal rate and regular rhythm.     Heart sounds: Normal heart sounds.     Comments: Cardiac exam shows a regular rate and rhythm.  He has a 3/6 systolic ejection murmur consistent with aortic stenosis. Pulmonary:     Effort: Pulmonary effort is normal.     Breath sounds: Normal breath sounds.  Abdominal:     General: Bowel sounds are normal.     Palpations: Abdomen is soft.  Musculoskeletal:        General: No tenderness or deformity. Normal range of motion.     Cervical back: Normal range of motion.  Lymphadenopathy:     Cervical: No cervical adenopathy.  Skin:    General: Skin is warm and dry.     Findings: No erythema or rash.  Neurological:     Mental Status: He is alert and oriented to person, place, and time.  Psychiatric:        Behavior: Behavior normal.        Thought Content: Thought content normal.        Judgment: Judgment normal.    Lab Results  Component Value Date   WBC 2.1 (L) 06/04/2021   HGB 7.3 (L) 06/04/2021   HCT 22.9 (L) 06/04/2021   MCV 98.7 06/04/2021   PLT 178 06/04/2021   Lab Results  Component Value Date   FERRITIN 864 (H) 05/19/2021   IRON 224 (H) 05/19/2021   TIBC 261 05/19/2021   UIBC 37 05/19/2021   IRONPCTSAT 86 (H) 05/19/2021   Lab Results  Component Value Date   RETICCTPCT 2.7 05/06/2021   RBC 2.32 (L) 06/04/2021   No results found for: KPAFRELGTCHN, LAMBDASER, KAPLAMBRATIO No results found for: IGGSERUM, IGA, IGMSERUM No results found for: Ronnald Ramp, A1GS, A2GS, Arnaldo Natal, GAMS, MSPIKE, SPEI   Chemistry      Component Value Date/Time   NA 133 (L) 06/04/2021 1310    K 4.4 06/04/2021 1310   CL 100 06/04/2021 1310   CO2 27 06/04/2021 1310   BUN 19 06/04/2021 1310   CREATININE 0.66 06/04/2021 1310      Component Value Date/Time   CALCIUM 9.5 06/04/2021 1310   ALKPHOS 120 06/04/2021 1310   AST 12 (L) 06/04/2021  1310   ALT 12 06/04/2021 1310   BILITOT 0.4 06/04/2021 1310       Impression and Plan: Mr. Goostree is a very pleasant 55 yo Hispanic gentleman with high grade myelodysplasia, complex cytogenetics.   We will go ahead with his fourth cycle of treatment.  Hopefully, when he has the bone marrow test done, there will be an improvement.  I suppose we could always add venetoclax to the [place].  He is seen Dr. Mariea Clonts out at [facility].  Hopefully, they will be able to find a donor marrow for Mr. Janssen.  I think we will go ahead and transfuse him.  I think this would be helpful.  I realize his hemoglobin is not as low as it can be but I think he would benefit from a transfusion.  We will continue to follow him along weekly with lab work.  I am just happy that his quality of life seems to be doing little bit better.  He is trying his best.  He really is determined.  I am incredibly impressed with his resilience.  We will get him back to see [place] in another 3-4 weeks.    [name], MD 7/7/20227:20 AM

## 2021-06-05 NOTE — Patient Instructions (Signed)
[facility]  Discharge Instructions: Thank you for choosing [facility] to provide your oncology and hematology care.   If you have a lab appointment with the [facility], please go directly to the [facility] and check in at the registration area.  Wear comfortable clothing and clothing appropriate for easy access to any Portacath or PICC line.   We strive to give you quality time with your provider. You may need to reschedule your appointment if you arrive late (15 or more minutes).  Arriving late affects you and other patients whose appointments are after yours.  Also, if you miss three or more appointments without notifying the office, you may be dismissed from the clinic at the provider's discretion.      For prescription refill requests, have your pharmacy contact our office and allow 72 hours for refills to be completed.    Today you received the following chemotherapy and/or immunotherapy agents Vidaza      To help prevent nausea and vomiting after your treatment, we encourage you to take your nausea medication as directed.  BELOW ARE SYMPTOMS THAT SHOULD BE REPORTED IMMEDIATELY: *FEVER GREATER THAN 100.4 F (38 C) OR HIGHER *CHILLS OR SWEATING *NAUSEA AND VOMITING THAT IS NOT CONTROLLED WITH YOUR NAUSEA MEDICATION *UNUSUAL SHORTNESS OF BREATH *UNUSUAL BRUISING OR BLEEDING *URINARY PROBLEMS (pain or burning when urinating, or frequent urination) *BOWEL PROBLEMS (unusual diarrhea, constipation, pain near the anus) TENDERNESS IN MOUTH AND THROAT WITH OR WITHOUT PRESENCE OF ULCERS (sore throat, sores in mouth, or a toothache) UNUSUAL RASH, SWELLING OR PAIN  UNUSUAL VAGINAL DISCHARGE OR ITCHING   Items with * indicate a potential emergency and should be followed up as soon as possible or go to the Emergency Department if any problems should occur.  Please show the CHEMOTHERAPY ALERT CARD or IMMUNOTHERAPY ALERT CARD at check-in to the  Emergency Department and triage nurse. Should you have questions after your visit or need to cancel or reschedule your appointment, please contact [facility]  [phone] and follow the prompts.  Office hours are 8:00 a.m. to 4:30 p.m. Monday - Friday. Please note that voicemails left after 4:00 p.m. may not be returned until the following business day.  We are closed weekends and major holidays. You have access to a nurse at all times for urgent questions. Please call the main number to the clinic [phone] and follow the prompts.  For any non-urgent questions, you may also contact your provider using MyChart. We now offer e-Visits for anyone 107 and older to request care online for non-urgent symptoms. For details visit [email or web address].   Also download the MyChart app! Go to the app store, search "MyChart", open the app, select [facility], and log in with your MyChart username and password.  Due to Covid, a mask is required upon entering the hospital/clinic. If you do not have a mask, one will be given to you upon arrival. For doctor visits, patients may have 1 support person aged 10 or older with them. For treatment visits, patients cannot have anyone with them due to current Covid guidelines and our immunocompromised population.

## 2021-06-05 NOTE — Progress Notes (Signed)
Patient took his Zofran at home today prior to coming for his injections.

## 2021-06-06 ENCOUNTER — Ambulatory Visit: Payer: Self-pay

## 2021-06-06 ENCOUNTER — Inpatient Hospital Stay: Payer: Self-pay

## 2021-06-06 ENCOUNTER — Other Ambulatory Visit: Payer: Self-pay

## 2021-06-06 ENCOUNTER — Encounter: Payer: Self-pay | Admitting: Hematology & Oncology

## 2021-06-06 VITALS — BP 102/49 | HR 72 | Temp 98.4°F | Resp 16

## 2021-06-06 DIAGNOSIS — D649 Anemia, unspecified: Secondary | ICD-10-CM

## 2021-06-06 DIAGNOSIS — D46Z Other myelodysplastic syndromes: Secondary | ICD-10-CM

## 2021-06-06 MED ORDER — SODIUM CHLORIDE 0.9% IV SOLUTION
250.0000 mL | Freq: Once | INTRAVENOUS | Status: AC
  Administered 2021-06-06: 250 mL via INTRAVENOUS
  Filled 2021-06-06: qty 250

## 2021-06-06 MED ORDER — ACETAMINOPHEN 325 MG PO TABS
650.0000 mg | ORAL_TABLET | Freq: Once | ORAL | Status: AC
Start: 2021-06-06 — End: 2021-06-06
  Administered 2021-06-06: 650 mg via ORAL

## 2021-06-06 MED ORDER — DIPHENHYDRAMINE HCL 25 MG PO CAPS
ORAL_CAPSULE | ORAL | Status: AC
  Filled 2021-06-06: qty 1

## 2021-06-06 MED ORDER — AZACITIDINE CHEMO SQ INJECTION
75.0000 mg/m2 | Freq: Once | INTRAMUSCULAR | Status: AC
  Administered 2021-06-06: 142.5 mg via SUBCUTANEOUS
  Filled 2021-06-06: qty 5.7

## 2021-06-06 MED ORDER — ONDANSETRON HCL 8 MG PO TABS
ORAL_TABLET | ORAL | Status: AC
  Filled 2021-06-06: qty 1

## 2021-06-06 MED ORDER — ACETAMINOPHEN 325 MG PO TABS
ORAL_TABLET | ORAL | Status: AC
  Filled 2021-06-06: qty 2

## 2021-06-06 MED ORDER — DIPHENHYDRAMINE HCL 25 MG PO CAPS
25.0000 mg | ORAL_CAPSULE | Freq: Once | ORAL | Status: AC
Start: 2021-06-06 — End: 2021-06-06
  Administered 2021-06-06: 25 mg via ORAL

## 2021-06-06 MED ORDER — ONDANSETRON HCL 8 MG PO TABS
8.0000 mg | ORAL_TABLET | Freq: Once | ORAL | Status: AC
  Administered 2021-06-06: 8 mg via ORAL

## 2021-06-06 NOTE — Patient Instructions (Signed)
https://www.redcrossblood.org/donate-blood/blood-donation-process/what-happens-to-donated-blood/blood-transfusions/types-of-blood-transfusions.html"> https://www.hematology.org/education/patients/blood-basics/blood-safety-and-matching"> https://www.nhlbi.nih.gov/health-topics/blood-transfusion">  Blood Transfusion, Adult A blood transfusion is a procedure in which you receive blood or a type of blood cell (blood component) through an IV. You may need a blood transfusion when your blood level is low. This may result from a bleeding disorder, illness, injury, or surgery. The blood may come from a donor. You may also be able to donate blood for yourself (autologous blood donation) before a planned surgery. The blood given in a transfusion is made up of different blood components. You may receive: Red blood cells. These carry oxygen to the cells in the body. Platelets. These help your blood to clot. Plasma. This is the liquid part of your blood. It carries proteins and other substances throughout the body. White blood cells. These help you fight infections. If you have hemophilia or another clotting disorder, you may also receive othertypes of blood products. Tell a health care provider about: Any blood disorders you have. Any previous reactions you have had during a blood transfusion. Any allergies you have. All medicines you are taking, including vitamins, herbs, eye drops, creams, and over-the-counter medicines. Any surgeries you have had. Any medical conditions you have, including any recent fever or cold symptoms. Whether you are pregnant or may be pregnant. What are the risks? Generally, this is a safe procedure. However, problems may occur. The most common problems include: A mild allergic reaction, such as red, swollen areas of skin (hives) and itching. Fever or chills. This may be the body's response to new blood cells received. This may occur during or up to 4 hours after the  transfusion. More serious problems may include: Transfusion-associated circulatory overload (TACO), or too much fluid in the lungs. This may cause breathing problems. A serious allergic reaction, such as difficulty breathing or swelling around the face and lips. Transfusion-related acute lung injury (TRALI), which causes breathing difficulty and low oxygen in the blood. This can occur within hours of the transfusion or several days later. Iron overload. This can happen after receiving many blood transfusions over a period of time. Infection or virus being transmitted. This is rare because donated blood is carefully tested before it is given. Hemolytic transfusion reaction. This is rare. It happens when your body's defense system (immune system)tries to attack the new blood cells. Symptoms may include fever, chills, nausea, low blood pressure, and low back or chest pain. Transfusion-associated graft-versus-host disease (TAGVHD). This is rare. It happens when donated cells attack your body's healthy tissues. What happens before the procedure? Medicines Ask your health care provider about: Changing or stopping your regular medicines. This is especially important if you are taking diabetes medicines or blood thinners. Taking medicines such as aspirin and ibuprofen. These medicines can thin your blood. Do not take these medicines unless your health care provider tells you to take them. Taking over-the-counter medicines, vitamins, herbs, and supplements. General instructions Follow instructions from your health care provider about eating and drinking restrictions. You will have a blood test to determine your blood type. This is necessary to know what kind of blood your body will accept and to match it to the donor blood. If you are going to have a planned surgery, you may be able to do an autologous blood donation. This may be done in case you need to have a transfusion. You will have your temperature,  blood pressure, and pulse monitored before the transfusion. If you have had an allergic reaction to a transfusion in the past, you may be given   medicine to help prevent a reaction. This medicine may be given to you by mouth (orally) or through an IV. Set aside time for the blood transfusion. This procedure generally takes 1-4 hours to complete. What happens during the procedure?  An IV will be inserted into one of your veins. The bag of donated blood will be attached to your IV. The blood will then enter through your vein. Your temperature, blood pressure, and pulse will be monitored regularly during the transfusion. This monitoring is done to detect early signs of a transfusion reaction. Tell your nurse right away if you have any of these symptoms during the transfusion: Shortness of breath or trouble breathing. Chest or back pain. Fever or chills. Hives or itching. If you have any signs or symptoms of a reaction, your transfusion will be stopped and you may be given medicine. When the transfusion is complete, your IV will be removed. Pressure may be applied to the IV site for a few minutes. A bandage (dressing)will be applied. The procedure may vary among health care providers and hospitals. What happens after the procedure? Your temperature, blood pressure, pulse, breathing rate, and blood oxygen level will be monitored until you leave the hospital or clinic. Your blood may be tested to see how you are responding to the transfusion. You may be warmed with fluids or blankets to maintain a normal body temperature. If you receive your blood transfusion in an outpatient setting, you will be told whom to contact to report any reactions. Where to find more information For more information on blood transfusions, visit the American Red Cross: redcross.org Summary A blood transfusion is a procedure in which you receive blood or a type of blood cell (blood component) through an IV. The blood you  receive may come from a donor or be donated by yourself (autologous blood donation) before a planned surgery. The blood given in a transfusion is made up of different blood components. You may receive red blood cells, platelets, plasma, or white blood cells depending on the condition treated. Your temperature, blood pressure, and pulse will be monitored before, during, and after the transfusion. After the transfusion, your blood may be tested to see how your body has responded. This information is not intended to replace advice given to you by your health care provider. Make sure you discuss any questions you have with your healthcare provider. Document Revised: 09/21/2019 Document Reviewed: 05/11/2019 Elsevier Patient Education  2022 Elsevier Inc.  

## 2021-06-06 NOTE — Progress Notes (Signed)
Per Laverna Peace, NP ok to proceed with blood transfusion with temperature of 100.0

## 2021-06-08 LAB — BPAM RBC
Blood Product Expiration Date: 202208052359
Blood Product Expiration Date: 202208082359
ISSUE DATE / TIME: 202207080759
ISSUE DATE / TIME: 202207080759
Unit Type and Rh: 5100
Unit Type and Rh: 5100

## 2021-06-08 LAB — TYPE AND SCREEN
ABO/RH(D): O POS
Antibody Screen: NEGATIVE
Unit division: 0
Unit division: 0

## 2021-06-09 ENCOUNTER — Telehealth: Payer: Self-pay | Admitting: *Deleted

## 2021-06-09 ENCOUNTER — Other Ambulatory Visit: Payer: Self-pay

## 2021-06-09 ENCOUNTER — Inpatient Hospital Stay: Payer: Self-pay

## 2021-06-09 VITALS — BP 103/59 | HR 82 | Temp 98.3°F | Resp 17

## 2021-06-09 DIAGNOSIS — D46Z Other myelodysplastic syndromes: Secondary | ICD-10-CM

## 2021-06-09 LAB — CBC WITH DIFFERENTIAL (CANCER CENTER ONLY)
Abs Immature Granulocytes: 0.01 10*3/uL (ref 0.00–0.07)
Basophils Absolute: 0 10*3/uL (ref 0.0–0.1)
Basophils Relative: 0 %
Eosinophils Absolute: 0 10*3/uL (ref 0.0–0.5)
Eosinophils Relative: 1 %
HCT: 32.1 % — ABNORMAL LOW (ref 39.0–52.0)
Hemoglobin: 10.6 g/dL — ABNORMAL LOW (ref 13.0–17.0)
Immature Granulocytes: 1 %
Lymphocytes Relative: 78 %
Lymphs Abs: 1.6 10*3/uL (ref 0.7–4.0)
MCH: 31.3 pg (ref 26.0–34.0)
MCHC: 33 g/dL (ref 30.0–36.0)
MCV: 94.7 fL (ref 80.0–100.0)
Monocytes Absolute: 0 10*3/uL — ABNORMAL LOW (ref 0.1–1.0)
Monocytes Relative: 2 %
Neutro Abs: 0.4 10*3/uL — CL (ref 1.7–7.7)
Neutrophils Relative %: 18 %
Platelet Count: 134 10*3/uL — ABNORMAL LOW (ref 150–400)
RBC: 3.39 MIL/uL — ABNORMAL LOW (ref 4.22–5.81)
RDW: 22.6 % — ABNORMAL HIGH (ref 11.5–15.5)
WBC Count: 2 10*3/uL — ABNORMAL LOW (ref 4.0–10.5)
nRBC: 1 % — ABNORMAL HIGH (ref 0.0–0.2)

## 2021-06-09 LAB — BASIC METABOLIC PANEL - CANCER CENTER ONLY
Anion gap: 7 (ref 5–15)
BUN: 20 mg/dL (ref 6–20)
CO2: 27 mmol/L (ref 22–32)
Calcium: 10.3 mg/dL (ref 8.9–10.3)
Chloride: 101 mmol/L (ref 98–111)
Creatinine: 0.73 mg/dL (ref 0.61–1.24)
GFR, Estimated: >60 mL/min (ref >60)
Glucose, Bld: 124 mg/dL — ABNORMAL HIGH (ref 70–99)
Potassium: 4 mmol/L (ref 3.5–5.1)
Sodium: 135 mmol/L (ref 135–145)

## 2021-06-09 MED ORDER — AZACITIDINE CHEMO SQ INJECTION
75.0000 mg/m2 | Freq: Once | INTRAMUSCULAR | Status: AC
  Administered 2021-06-09: 142.5 mg via SUBCUTANEOUS
  Filled 2021-06-09: qty 5.7

## 2021-06-09 MED ORDER — ONDANSETRON HCL 8 MG PO TABS
ORAL_TABLET | ORAL | Status: AC
  Filled 2021-06-09: qty 1

## 2021-06-09 MED ORDER — ONDANSETRON HCL 8 MG PO TABS: 8.0000 mg | ORAL_TABLET | Freq: Once | ORAL | Status: DC

## 2021-06-09 NOTE — Progress Notes (Signed)
Okay to treat to day with ANC 0.4 per Laverna Peace, NP.

## 2021-06-09 NOTE — Patient Instructions (Addendum)
[facility]  Discharge Instructions: Thank you for choosing [facility] to provide your oncology and hematology care.   If you have a lab appointment with the [facility], please go directly to the [facility] and check in at the registration area.  Wear comfortable clothing and clothing appropriate for easy access to any Portacath or PICC line.   We strive to give you quality time with your provider. You may need to reschedule your appointment if you arrive late (15 or more minutes).  Arriving late affects you and other patients whose appointments are after yours.  Also, if you miss three or more appointments without notifying the office, you may be dismissed from the clinic at the provider's discretion.      For prescription refill requests, have your pharmacy contact our office and allow 72 hours for refills to be completed.    Today you received the following chemotherapy and/or immunotherapy agents Vidaza      To help prevent nausea and vomiting after your treatment, we encourage you to take your nausea medication as directed.  BELOW ARE SYMPTOMS THAT SHOULD BE REPORTED IMMEDIATELY: *FEVER GREATER THAN 100.4 F (38 C) OR HIGHER *CHILLS OR SWEATING *NAUSEA AND VOMITING THAT IS NOT CONTROLLED WITH YOUR NAUSEA MEDICATION *UNUSUAL SHORTNESS OF BREATH *UNUSUAL BRUISING OR BLEEDING *URINARY PROBLEMS (pain or burning when urinating, or frequent urination) *BOWEL PROBLEMS (unusual diarrhea, constipation, pain near the anus) TENDERNESS IN MOUTH AND THROAT WITH OR WITHOUT PRESENCE OF ULCERS (sore throat, sores in mouth, or a toothache) UNUSUAL RASH, SWELLING OR PAIN  UNUSUAL VAGINAL DISCHARGE OR ITCHING   Items with * indicate a potential emergency and should be followed up as soon as possible or go to the Emergency Department if any problems should occur.  Please show the CHEMOTHERAPY ALERT CARD or IMMUNOTHERAPY ALERT CARD at check-in to the  Emergency Department and triage nurse. Should you have questions after your visit or need to cancel or reschedule your appointment, please contact [facility]  [phone] and follow the prompts.  Office hours are 8:00 a.m. to 4:30 p.m. Monday - Friday. Please note that voicemails left after 4:00 p.m. may not be returned until the following business day.  We are closed weekends and major holidays. You have access to a nurse at all times for urgent questions. Please call the main number to the clinic [phone] and follow the prompts.  For any non-urgent questions, you may also contact your provider using MyChart. We now offer e-Visits for anyone 80 and older to request care online for non-urgent symptoms. For details visit [email or web address].   Also download the MyChart app! Go to the app store, search "MyChart", open the app, select [facility], and log in with your MyChart username and password.  Due to Covid, a mask is required upon entering the hospital/clinic. If you do not have a mask, one will be given to you upon arrival. For doctor visits, patients may have 1 support person aged 51 or older with them. For treatment visits, patients cannot have anyone with them due to current Covid guidelines and our immunocompromised population. [facility]  Discharge Instructions: Thank you for choosing [facility] to provide your oncology and hematology care.   If you have a lab appointment with the [facility], please go directly to the [facility] and check in at the registration area.  Wear comfortable clothing  and clothing appropriate for easy access to any Portacath or PICC line.   We strive to give you quality time with your provider. You may need to reschedule your appointment if you arrive late (15 or more minutes).  Arriving late affects you and other patients whose appointments are after yours.  Also, if you miss three  or more appointments without notifying the office, you may be dismissed from the clinic at the provider's discretion.      For prescription refill requests, have your pharmacy contact our office and allow 72 hours for refills to be completed.    Today you received the following chemotherapy and/or immunotherapy agents vidaza    To help prevent nausea and vomiting after your treatment, we encourage you to take your nausea medication as directed.  BELOW ARE SYMPTOMS THAT SHOULD BE REPORTED IMMEDIATELY: *FEVER GREATER THAN 100.4 F (38 C) OR HIGHER *CHILLS OR SWEATING *NAUSEA AND VOMITING THAT IS NOT CONTROLLED WITH YOUR NAUSEA MEDICATION *UNUSUAL SHORTNESS OF BREATH *UNUSUAL BRUISING OR BLEEDING *URINARY PROBLEMS (pain or burning when urinating, or frequent urination) *BOWEL PROBLEMS (unusual diarrhea, constipation, pain near the anus) TENDERNESS IN MOUTH AND THROAT WITH OR WITHOUT PRESENCE OF ULCERS (sore throat, sores in mouth, or a toothache) UNUSUAL RASH, SWELLING OR PAIN  UNUSUAL VAGINAL DISCHARGE OR ITCHING   Items with * indicate a potential emergency and should be followed up as soon as possible or go to the Emergency Department if any problems should occur.  Please show the CHEMOTHERAPY ALERT CARD or IMMUNOTHERAPY ALERT CARD at check-in to the Emergency Department and triage nurse. Should you have questions after your visit or need to cancel or reschedule your appointment, please contact [facility]  [phone] and follow the prompts.  Office hours are 8:00 a.m. to 4:30 p.m. Monday - Friday. Please note that voicemails left after 4:00 p.m. may not be returned until the following business day.  We are closed weekends and major holidays. You have access to a nurse at all times for urgent questions. Please call the main number to the clinic [phone] and follow the prompts.  For any non-urgent questions, you may also contact your provider using MyChart.  We now offer e-Visits for anyone 23 and older to request care online for non-urgent symptoms. For details visit [email or web address].   Also download the MyChart app! Go to the app store, search "MyChart", open the app, select [facility], and log in with your MyChart username and password.  Due to Covid, a mask is required upon entering the hospital/clinic. If you do not have a mask, one will be given to you upon arrival. For doctor visits, patients may have 1 support person aged 72 or older with them. For treatment visits, patients cannot have anyone with them due to current Covid guidelines and our immunocompromised population.

## 2021-06-09 NOTE — Telephone Encounter (Signed)
[facility] .4 called by Richardson Landry in lab.  Dr Marin Olp  notified.

## 2021-06-10 ENCOUNTER — Inpatient Hospital Stay: Payer: Self-pay

## 2021-06-10 VITALS — BP 101/56 | HR 82 | Temp 98.1°F

## 2021-06-10 DIAGNOSIS — D46Z Other myelodysplastic syndromes: Secondary | ICD-10-CM

## 2021-06-10 MED ORDER — AZACITIDINE CHEMO SQ INJECTION
75.0000 mg/m2 | Freq: Once | INTRAMUSCULAR | Status: AC
  Administered 2021-06-10: 142.5 mg via SUBCUTANEOUS
  Filled 2021-06-10: qty 5.7

## 2021-06-10 NOTE — Patient Instructions (Signed)
[facility]  Discharge Instructions: Thank you for choosing [facility] to provide your oncology and hematology care.   If you have a lab appointment with the [facility], please go directly to the [facility] and check in at the registration area.  Wear comfortable clothing and clothing appropriate for easy access to any Portacath or PICC line.   We strive to give you quality time with your provider. You may need to reschedule your appointment if you arrive late (15 or more minutes).  Arriving late affects you and other patients whose appointments are after yours.  Also, if you miss three or more appointments without notifying the office, you may be dismissed from the clinic at the provider's discretion.      For prescription refill requests, have your pharmacy contact our office and allow 72 hours for refills to be completed.    Today you received the following chemotherapy and/or immunotherapy agents vidaza       To help prevent nausea and vomiting after your treatment, we encourage you to take your nausea medication as directed.  BELOW ARE SYMPTOMS THAT SHOULD BE REPORTED IMMEDIATELY: *FEVER GREATER THAN 100.4 F (38 C) OR HIGHER *CHILLS OR SWEATING *NAUSEA AND VOMITING THAT IS NOT CONTROLLED WITH YOUR NAUSEA MEDICATION *UNUSUAL SHORTNESS OF BREATH *UNUSUAL BRUISING OR BLEEDING *URINARY PROBLEMS (pain or burning when urinating, or frequent urination) *BOWEL PROBLEMS (unusual diarrhea, constipation, pain near the anus) TENDERNESS IN MOUTH AND THROAT WITH OR WITHOUT PRESENCE OF ULCERS (sore throat, sores in mouth, or a toothache) UNUSUAL RASH, SWELLING OR PAIN  UNUSUAL VAGINAL DISCHARGE OR ITCHING   Items with * indicate a potential emergency and should be followed up as soon as possible or go to the Emergency Department if any problems should occur.  Please show the CHEMOTHERAPY ALERT CARD or IMMUNOTHERAPY ALERT CARD at check-in to the  Emergency Department and triage nurse. Should you have questions after your visit or need to cancel or reschedule your appointment, please contact [facility]  [phone] and follow the prompts.  Office hours are 8:00 a.m. to 4:30 p.m. Monday - Friday. Please note that voicemails left after 4:00 p.m. may not be returned until the following business day.  We are closed weekends and major holidays. You have access to a nurse at all times for urgent questions. Please call the main number to the clinic [phone] and follow the prompts.  For any non-urgent questions, you may also contact your provider using MyChart. We now offer e-Visits for anyone 57 and older to request care online for non-urgent symptoms. For details visit [email or web address].   Also download the MyChart app! Go to the app store, search "MyChart", open the app, select [facility], and log in with your MyChart username and password.  Due to Covid, a mask is required upon entering the hospital/clinic. If you do not have a mask, one will be given to you upon arrival. For doctor visits, patients may have 1 support person aged 57 or older with them. For treatment visits, patients cannot have anyone with them due to current Covid guidelines and our immunocompromised population.

## 2021-06-10 NOTE — Progress Notes (Signed)
Patient states he took his Zofran at home prior to arrival here for treatment.

## 2021-06-13 IMAGING — CR DG CHEST 2V
2 series · 2 of 2 positions shown · non-contrast
Comparison: None.

CLINICAL DATA: Shortness of breath, fever, cough.

EXAM:
CHEST - 2 VIEW

[w chest pa]
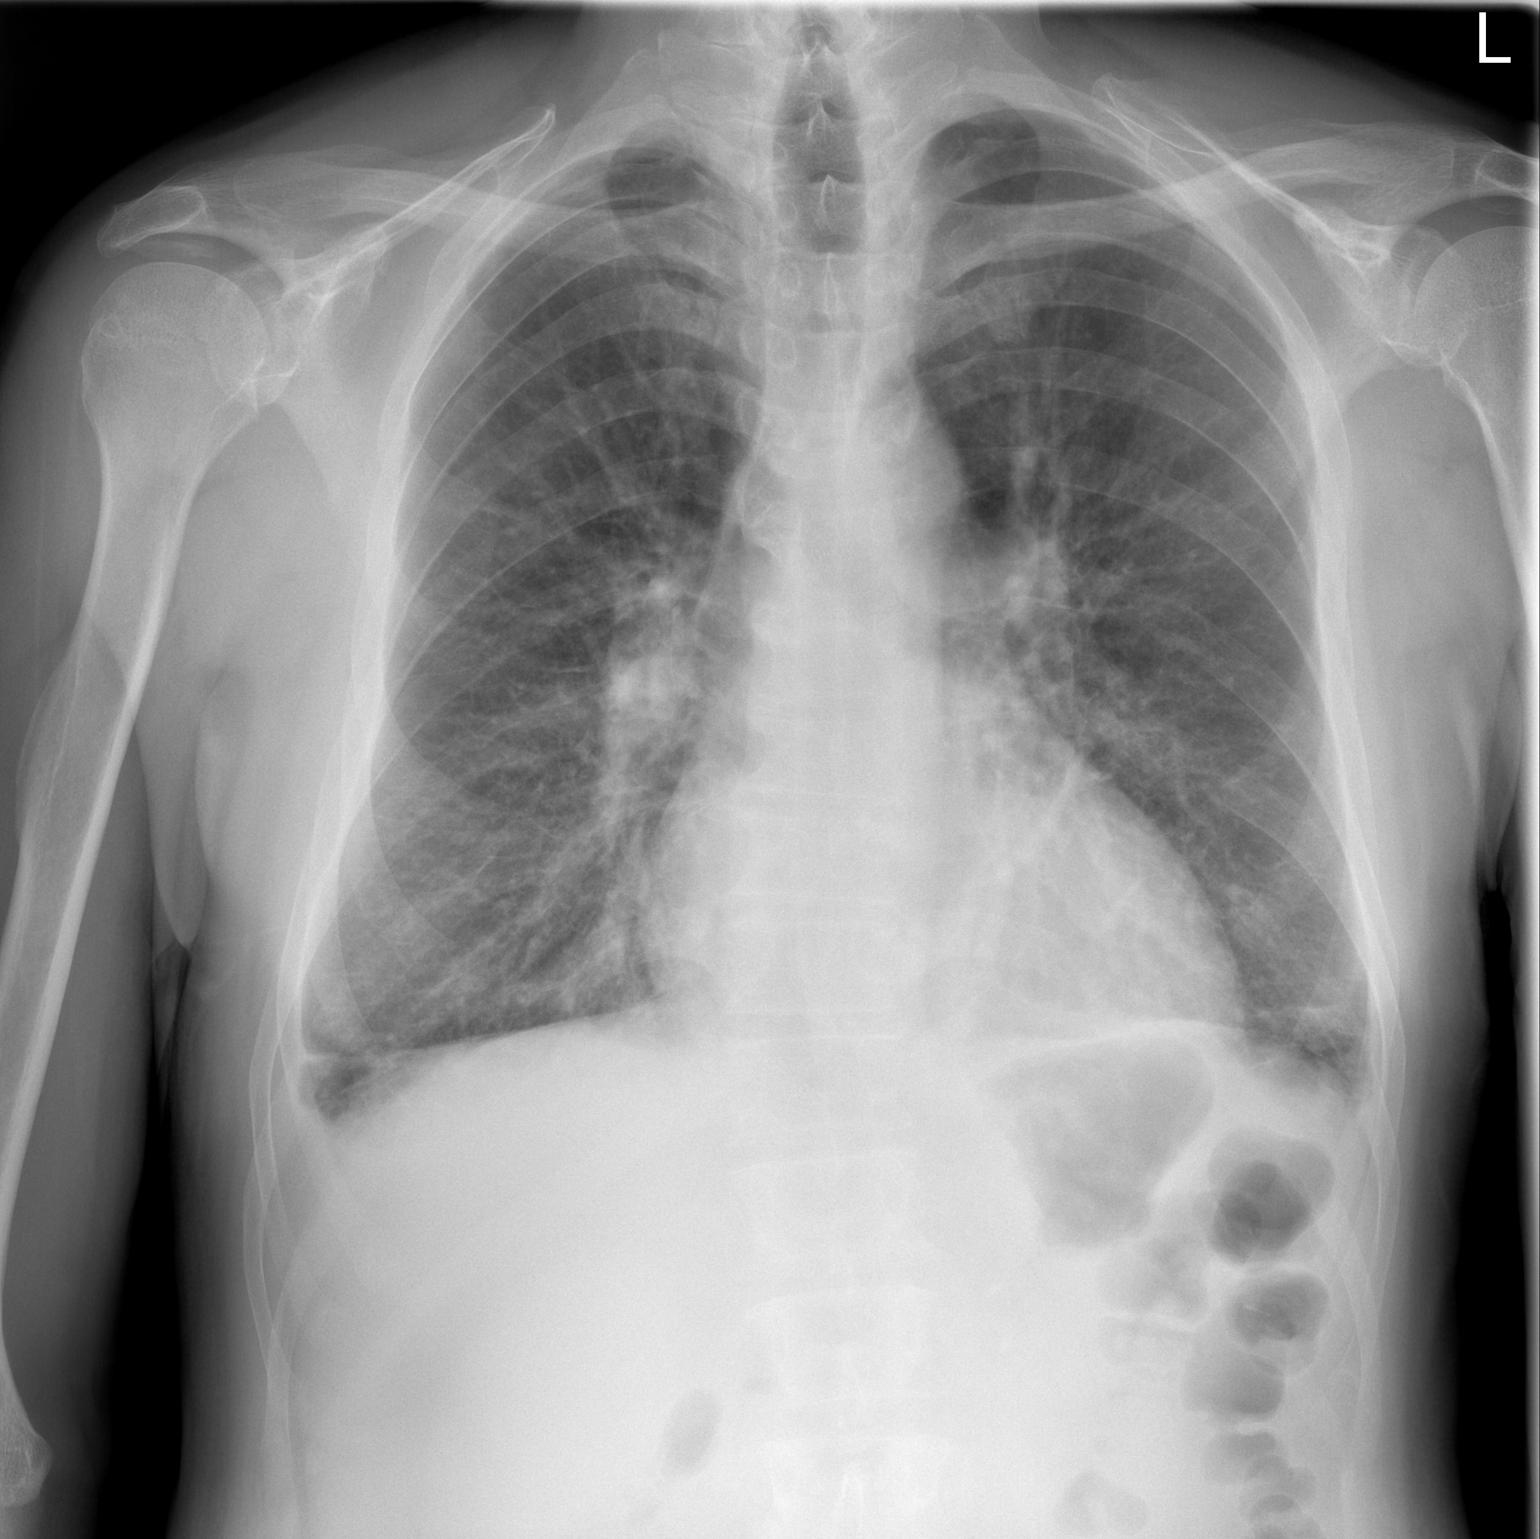

[w chest lat]
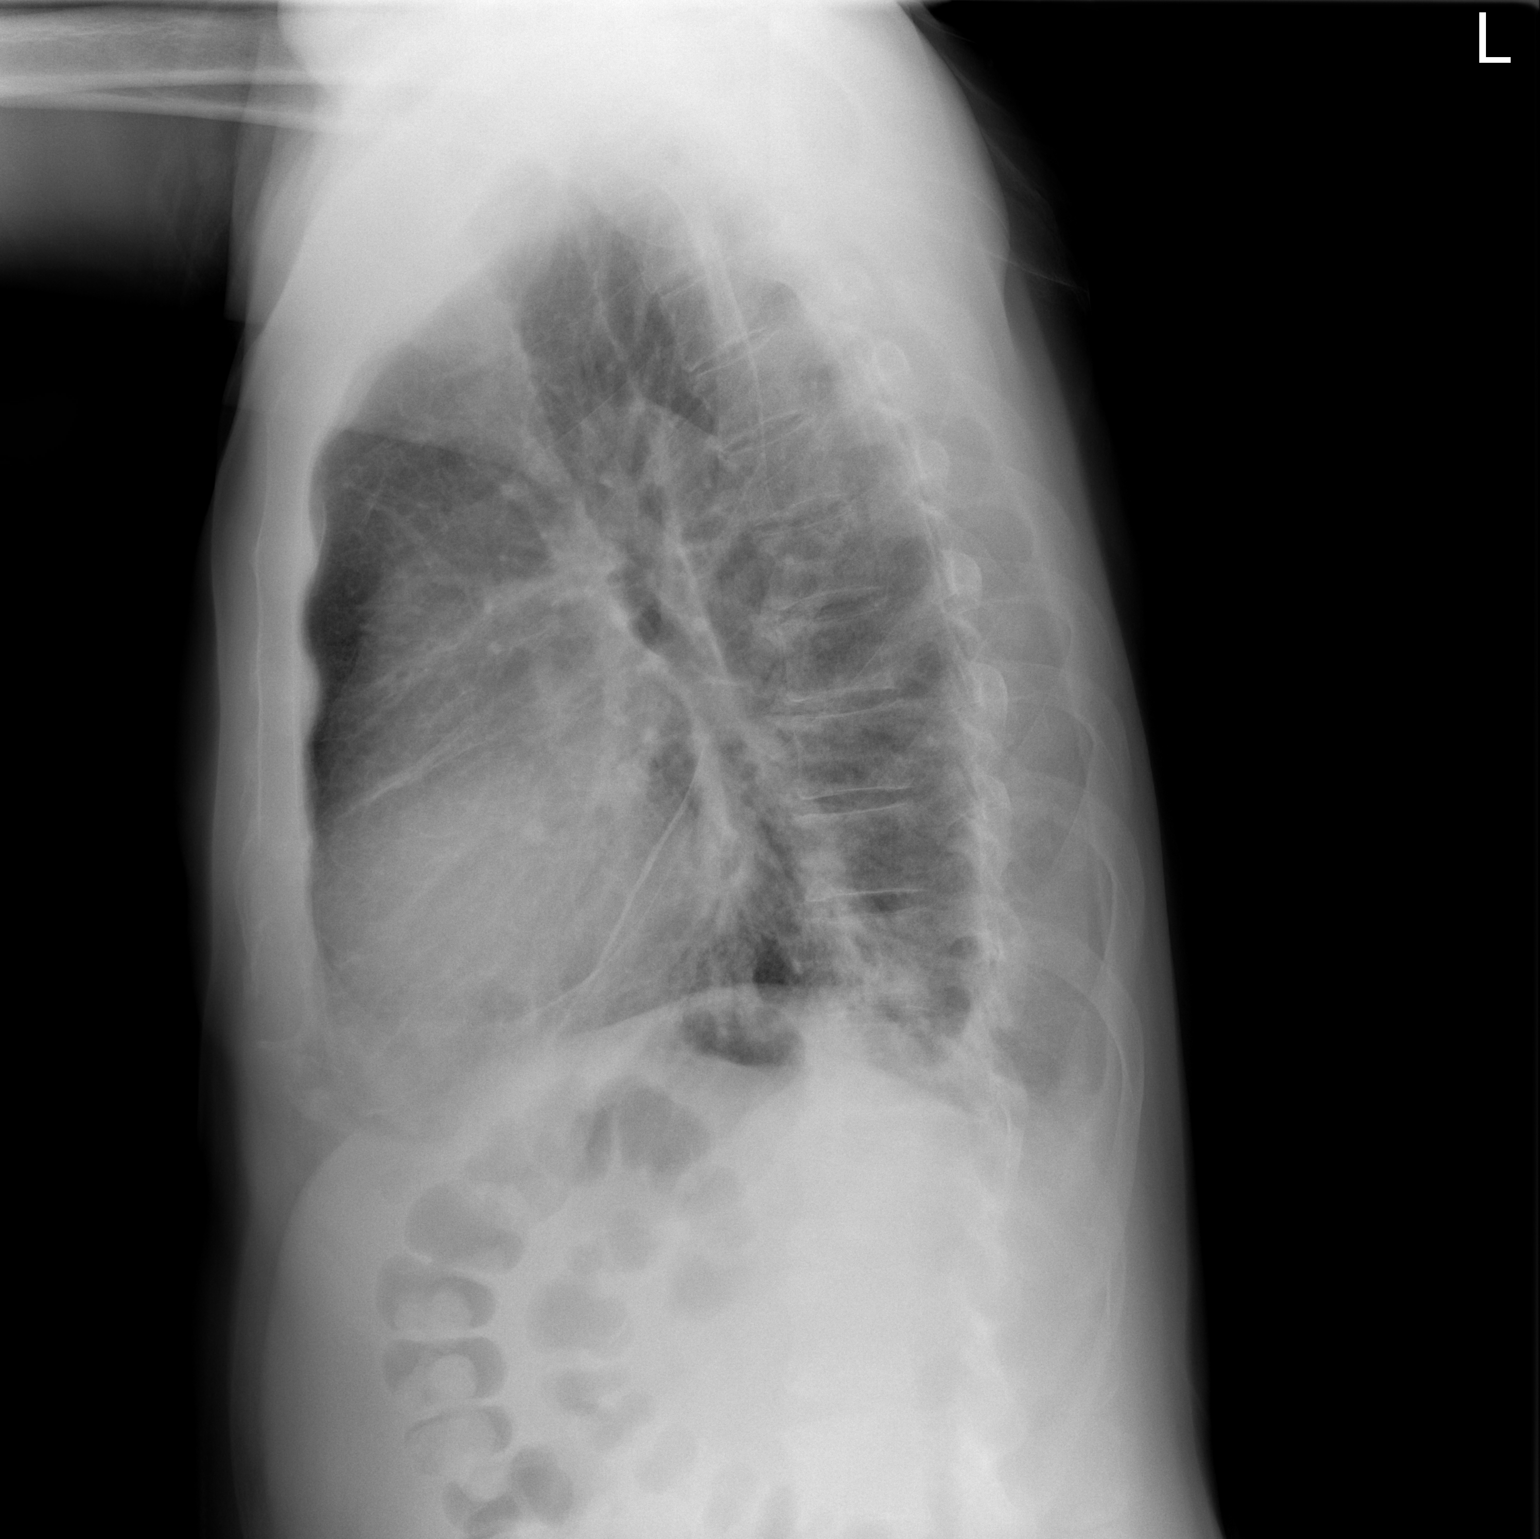

[2 of 2 positions shown; findings below may reference images not displayed]

FINDINGS: The heart size and mediastinal contours are within normal limits. No
pneumothorax is noted. Minimal bibasilar subsegmental atelectasis is
noted with small pleural effusions. The visualized skeletal
structures are unremarkable.
IMPRESSION: Minimal bibasilar subsegmental atelectasis with small pleural
effusions.

## 2021-06-16 ENCOUNTER — Telehealth: Payer: Self-pay

## 2021-06-16 ENCOUNTER — Inpatient Hospital Stay: Payer: Self-pay

## 2021-06-16 ENCOUNTER — Other Ambulatory Visit: Payer: Self-pay

## 2021-06-16 ENCOUNTER — Other Ambulatory Visit: Payer: Self-pay | Admitting: *Deleted

## 2021-06-16 DIAGNOSIS — D46Z Other myelodysplastic syndromes: Secondary | ICD-10-CM

## 2021-06-16 DIAGNOSIS — D649 Anemia, unspecified: Secondary | ICD-10-CM

## 2021-06-16 LAB — CBC WITH DIFFERENTIAL (CANCER CENTER ONLY)
Band Neutrophils: 0 %
Basophils Absolute: 0 10*3/uL (ref 0.0–0.1)
Basophils Relative: 0 %
Eosinophils Absolute: 0 10*3/uL (ref 0.0–0.5)
Eosinophils Relative: 0 %
HCT: 28 % — ABNORMAL LOW (ref 39.0–52.0)
Hemoglobin: 9 g/dL — ABNORMAL LOW (ref 13.0–17.0)
Lymphocytes Relative: 69 %
Lymphs Abs: 1.5 10*3/uL (ref 0.7–4.0)
MCH: 31.4 pg (ref 26.0–34.0)
MCHC: 32.1 g/dL (ref 30.0–36.0)
MCV: 97.6 fL (ref 80.0–100.0)
Monocytes Absolute: 0.1 10*3/uL (ref 0.1–1.0)
Monocytes Relative: 3 %
Neutro Abs: 0.6 10*3/uL — ABNORMAL LOW (ref 1.7–7.7)
Neutrophils Relative %: 28 %
Platelet Count: 79 10*3/uL — ABNORMAL LOW (ref 150–400)
RBC: 2.87 MIL/uL — ABNORMAL LOW (ref 4.22–5.81)
RDW: 22.7 % — ABNORMAL HIGH (ref 11.5–15.5)
WBC Count: 2.2 10*3/uL — ABNORMAL LOW (ref 4.0–10.5)
nRBC: 0.9 % — ABNORMAL HIGH (ref 0.0–0.2)
nRBC: 1 /100 WBC — ABNORMAL HIGH

## 2021-06-16 LAB — CMP (CANCER CENTER ONLY)
ALT: 14 U/L (ref 0–44)
AST: 13 U/L — ABNORMAL LOW (ref 15–41)
Albumin: 4 g/dL (ref 3.5–5.0)
Alkaline Phosphatase: 150 U/L — ABNORMAL HIGH (ref 38–126)
Anion gap: 6 (ref 5–15)
BUN: 17 mg/dL (ref 6–20)
CO2: 28 mmol/L (ref 22–32)
Calcium: 9.9 mg/dL (ref 8.9–10.3)
Chloride: 105 mmol/L (ref 98–111)
Creatinine: 0.63 mg/dL (ref 0.61–1.24)
GFR, Estimated: >60 mL/min (ref >60)
Glucose, Bld: 119 mg/dL — ABNORMAL HIGH (ref 70–99)
Potassium: 4.2 mmol/L (ref 3.5–5.1)
Sodium: 139 mmol/L (ref 135–145)
Total Bilirubin: 0.8 mg/dL (ref 0.3–1.2)
Total Protein: 8 g/dL (ref 6.5–8.1)

## 2021-06-16 LAB — SAMPLE TO BLOOD BANK

## 2021-06-16 NOTE — Telephone Encounter (Signed)
Per sch message and late 06/04/21 los, pt is coming in for labs today and other appts made, pt to gain sch at 06/16/21 appt  Thomas Frazier

## 2021-06-23 ENCOUNTER — Inpatient Hospital Stay: Payer: Self-pay

## 2021-06-23 ENCOUNTER — Other Ambulatory Visit: Payer: Self-pay

## 2021-06-23 DIAGNOSIS — D46Z Other myelodysplastic syndromes: Secondary | ICD-10-CM

## 2021-06-23 LAB — CBC WITH DIFFERENTIAL (CANCER CENTER ONLY)
Abs Immature Granulocytes: 0.01 10*3/uL (ref 0.00–0.07)
Basophils Absolute: 0 10*3/uL (ref 0.0–0.1)
Basophils Relative: 1 %
Eosinophils Absolute: 0 10*3/uL (ref 0.0–0.5)
Eosinophils Relative: 1 %
HCT: 30.3 % — ABNORMAL LOW (ref 39.0–52.0)
Hemoglobin: 9.9 g/dL — ABNORMAL LOW (ref 13.0–17.0)
Immature Granulocytes: 1 %
Lymphocytes Relative: 70 %
Lymphs Abs: 1.6 10*3/uL (ref 0.7–4.0)
MCH: 33.1 pg (ref 26.0–34.0)
MCHC: 32.7 g/dL (ref 30.0–36.0)
MCV: 101.3 fL — ABNORMAL HIGH (ref 80.0–100.0)
Monocytes Absolute: 0 10*3/uL — ABNORMAL LOW (ref 0.1–1.0)
Monocytes Relative: 2 %
Neutro Abs: 0.5 10*3/uL — ABNORMAL LOW (ref 1.7–7.7)
Neutrophils Relative %: 25 %
Platelet Count: 152 10*3/uL (ref 150–400)
RBC: 2.99 MIL/uL — ABNORMAL LOW (ref 4.22–5.81)
RDW: 23.9 % — ABNORMAL HIGH (ref 11.5–15.5)
WBC Count: 2.2 10*3/uL — ABNORMAL LOW (ref 4.0–10.5)
nRBC: 2.3 % — ABNORMAL HIGH (ref 0.0–0.2)

## 2021-06-23 LAB — BASIC METABOLIC PANEL - CANCER CENTER ONLY
Anion gap: 4 — ABNORMAL LOW (ref 5–15)
BUN: 14 mg/dL (ref 6–20)
CO2: 31 mmol/L (ref 22–32)
Calcium: 10.1 mg/dL (ref 8.9–10.3)
Chloride: 104 mmol/L (ref 98–111)
Creatinine: 0.72 mg/dL (ref 0.61–1.24)
GFR, Estimated: >60 mL/min (ref >60)
Glucose, Bld: 66 mg/dL — ABNORMAL LOW (ref 70–99)
Potassium: 4.1 mmol/L (ref 3.5–5.1)
Sodium: 139 mmol/L (ref 135–145)

## 2021-06-24 ENCOUNTER — Ambulatory Visit: Payer: PRIVATE HEALTH INSURANCE | Admitting: Student

## 2021-06-25 ENCOUNTER — Ambulatory Visit: Payer: Self-pay | Admitting: Student

## 2021-06-30 ENCOUNTER — Other Ambulatory Visit: Payer: Self-pay

## 2021-06-30 ENCOUNTER — Inpatient Hospital Stay: Payer: PRIVATE HEALTH INSURANCE | Attending: Family

## 2021-06-30 DIAGNOSIS — D464 Refractory anemia, unspecified: Secondary | ICD-10-CM | POA: Diagnosis present

## 2021-06-30 DIAGNOSIS — D46Z Other myelodysplastic syndromes: Secondary | ICD-10-CM

## 2021-06-30 DIAGNOSIS — Z5111 Encounter for antineoplastic chemotherapy: Secondary | ICD-10-CM | POA: Diagnosis present

## 2021-06-30 LAB — CBC WITH DIFFERENTIAL (CANCER CENTER ONLY)
Abs Immature Granulocytes: 0 10*3/uL (ref 0.00–0.07)
Basophils Absolute: 0 10*3/uL (ref 0.0–0.1)
Basophils Relative: 0 %
Eosinophils Absolute: 0 10*3/uL (ref 0.0–0.5)
Eosinophils Relative: 2 %
HCT: 32.5 % — ABNORMAL LOW (ref 39.0–52.0)
Hemoglobin: 10.6 g/dL — ABNORMAL LOW (ref 13.0–17.0)
Immature Granulocytes: 0 %
Lymphocytes Relative: 88 %
Lymphs Abs: 1.8 10*3/uL (ref 0.7–4.0)
MCH: 34.4 pg — ABNORMAL HIGH (ref 26.0–34.0)
MCHC: 32.6 g/dL (ref 30.0–36.0)
MCV: 105.5 fL — ABNORMAL HIGH (ref 80.0–100.0)
Monocytes Absolute: 0 10*3/uL — ABNORMAL LOW (ref 0.1–1.0)
Monocytes Relative: 2 %
Neutro Abs: 0.2 10*3/uL — CL (ref 1.7–7.7)
Neutrophils Relative %: 8 %
Platelet Count: 229 10*3/uL (ref 150–400)
RBC: 3.08 MIL/uL — ABNORMAL LOW (ref 4.22–5.81)
RDW: 22.3 % — ABNORMAL HIGH (ref 11.5–15.5)
WBC Count: 2 10*3/uL — ABNORMAL LOW (ref 4.0–10.5)
nRBC: 1 % — ABNORMAL HIGH (ref 0.0–0.2)

## 2021-06-30 LAB — BASIC METABOLIC PANEL - CANCER CENTER ONLY
Anion gap: 4 — ABNORMAL LOW (ref 5–15)
BUN: 21 mg/dL — ABNORMAL HIGH (ref 6–20)
CO2: 28 mmol/L (ref 22–32)
Calcium: 9.6 mg/dL (ref 8.9–10.3)
Chloride: 104 mmol/L (ref 98–111)
Creatinine: 0.66 mg/dL (ref 0.61–1.24)
GFR, Estimated: >60 mL/min (ref >60)
Glucose, Bld: 80 mg/dL (ref 70–99)
Potassium: 4.6 mmol/L (ref 3.5–5.1)
Sodium: 136 mmol/L (ref 135–145)

## 2021-07-03 ENCOUNTER — Ambulatory Visit: Payer: Self-pay | Admitting: Student

## 2021-07-07 ENCOUNTER — Other Ambulatory Visit: Payer: Self-pay

## 2021-07-07 ENCOUNTER — Inpatient Hospital Stay: Payer: PRIVATE HEALTH INSURANCE

## 2021-07-07 DIAGNOSIS — Z5111 Encounter for antineoplastic chemotherapy: Secondary | ICD-10-CM | POA: Diagnosis not present

## 2021-07-07 DIAGNOSIS — D46Z Other myelodysplastic syndromes: Secondary | ICD-10-CM

## 2021-07-07 LAB — BASIC METABOLIC PANEL - CANCER CENTER ONLY
Anion gap: 7 (ref 5–15)
BUN: 15 mg/dL (ref 6–20)
CO2: 28 mmol/L (ref 22–32)
Calcium: 9.9 mg/dL (ref 8.9–10.3)
Chloride: 103 mmol/L (ref 98–111)
Creatinine: 0.67 mg/dL (ref 0.61–1.24)
GFR, Estimated: >60 mL/min (ref >60)
Glucose, Bld: 91 mg/dL (ref 70–99)
Potassium: 4 mmol/L (ref 3.5–5.1)
Sodium: 138 mmol/L (ref 135–145)

## 2021-07-07 LAB — CBC WITH DIFFERENTIAL (CANCER CENTER ONLY)
Abs Immature Granulocytes: 0 10*3/uL (ref 0.00–0.07)
Basophils Absolute: 0 10*3/uL (ref 0.0–0.1)
Basophils Relative: 0 %
Eosinophils Absolute: 0.1 10*3/uL (ref 0.0–0.5)
Eosinophils Relative: 3 %
HCT: 35.9 % — ABNORMAL LOW (ref 39.0–52.0)
Hemoglobin: 11.9 g/dL — ABNORMAL LOW (ref 13.0–17.0)
Immature Granulocytes: 0 %
Lymphocytes Relative: 66 %
Lymphs Abs: 1.8 10*3/uL (ref 0.7–4.0)
MCH: 35 pg — ABNORMAL HIGH (ref 26.0–34.0)
MCHC: 33.1 g/dL (ref 30.0–36.0)
MCV: 105.6 fL — ABNORMAL HIGH (ref 80.0–100.0)
Monocytes Absolute: 0.3 10*3/uL (ref 0.1–1.0)
Monocytes Relative: 11 %
Neutro Abs: 0.5 10*3/uL — ABNORMAL LOW (ref 1.7–7.7)
Neutrophils Relative %: 20 %
Platelet Count: 175 10*3/uL (ref 150–400)
RBC: 3.4 MIL/uL — ABNORMAL LOW (ref 4.22–5.81)
RDW: 19.7 % — ABNORMAL HIGH (ref 11.5–15.5)
WBC Count: 2.7 10*3/uL — ABNORMAL LOW (ref 4.0–10.5)
nRBC: 1.1 % — ABNORMAL HIGH (ref 0.0–0.2)

## 2021-07-08 ENCOUNTER — Encounter: Payer: Self-pay | Admitting: Student

## 2021-07-09 ENCOUNTER — Encounter: Payer: Self-pay | Admitting: Student

## 2021-07-09 ENCOUNTER — Encounter: Payer: Self-pay | Admitting: Hematology & Oncology

## 2021-07-09 ENCOUNTER — Ambulatory Visit: Payer: Self-pay | Admitting: Student

## 2021-07-09 ENCOUNTER — Other Ambulatory Visit: Payer: Self-pay

## 2021-07-09 VITALS — BP 104/57 | HR 78 | Temp 98.1°F | Resp 17 | Ht 69.0 in | Wt 154.6 lb

## 2021-07-09 DIAGNOSIS — I350 Nonrheumatic aortic (valve) stenosis: Secondary | ICD-10-CM

## 2021-07-09 DIAGNOSIS — I5042 Chronic combined systolic (congestive) and diastolic (congestive) heart failure: Secondary | ICD-10-CM

## 2021-07-09 MED ORDER — SACUBITRIL-VALSARTAN 24-26 MG PO TABS: 0.5000 | ORAL_TABLET | Freq: Two times a day (BID) | ORAL | 3 refills | Status: DC

## 2021-07-09 NOTE — Progress Notes (Addendum)
Primary Physician/Referring:  Cathleen Corti, PA-C  Patient ID: Thomas Frazier, male    DOB: [birth year], 55 y.o.   MRN: [id number]  Chief Complaint  Patient presents with   Follow-up    4 WEEK   Congestive Heart Failure   MED TITRATION   HPI:    Thomas Frazier  is a 55 y.o.  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  Patient denies significant cardiovascular risk factors.  No history of hypertension, hyperlipidemia, diabetes, no history of premature CAD. No known history of CAD.  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  Guideline directed medical therapy was initiated including carvedilol and Entresto, as well as digoxin and Lasix.  Patient responded well to diuresis.  Suspect severe aortic stenosis to be etiology of cardiomyopathy and had no significant troponin leak suspect this is chronic.  Patient presents for 4-week follow-up of heart failure and aortic stenosis.  Last visit added Corlanor 5 mg twice daily, however patient never started Corlanor due to cost.  He has been monitoring his heart rate at home and states it has been averaging 75 to 80 bpm.  Overall patient is feeling well.  States his energy level is significantly improved.  He is following with oncology and is presently responding well to treatment, he is also scheduled tentatively for bone marrow transplant in December.  Notably patient had repeat echocardiogram by [facility] that showed LVEF improved to 50-55%.  Denies chest pain, dizziness, syncope, near syncope, palpitations, orthopnea, PND.  He does continue to have mild dyspnea on exertion.  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  Cardiovascular:  Negative for chest pain, claudication, leg swelling, near-syncope, orthopnea, palpitations, paroxysmal nocturnal dyspnea and syncope.  Respiratory:  Negative for shortness of breath.   Neurological:  Negative for dizziness.   Objective  Blood pressure (!) 104/57, pulse 78, temperature 98.1 F (36.7 C), temperature source Temporal, resp. rate 17, height '5\' 9"'$  (1.753 m), weight 154 lb 9.6 oz (70.1 kg), SpO2 99 %.  Vitals with BMI 07/09/2021 06/10/2021 06/09/2021  Height '5\' 9"'$  - -  Weight 154 lbs 10 oz - -  BMI [id number] - -  Systolic [id number]  Diastolic 57 56 59  Pulse 78 82 82      Physical Exam Vitals reviewed.  Constitutional:      Comments: Appears older than stated age.  Cardiovascular:     Rate and Rhythm: Normal rate and regular  rhythm.     Pulses: Intact distal pulses.          Carotid pulses are  on the right side with bruit and  on the left side with bruit.    Heart sounds: S1 normal and S2 normal. Murmur heard.  Harsh midsystolic murmur is present with a grade of 3/6 at the upper right sternal border radiating to the neck.    No gallop.  Pulmonary:     Effort: Pulmonary effort is normal.     Breath sounds: Normal breath sounds.  Musculoskeletal:      Right lower leg: No edema.     Left lower leg: No edema.  Neurological:     Mental Status: He is alert.    Laboratory examination:   Recent Labs    06/23/21 0910 06/30/21 0857 07/07/21 0849  NA 139 136 138  K 4.1 4.6 4.0  CL 104 104 103  CO2 '31 28 28  '$ GLUCOSE 66* 80 91  BUN 14 21* 15  CREATININE 0.72 0.66 0.67  CALCIUM 10.1 9.6 9.9  GFRNONAA >60 >60 >60   estimated creatinine clearance is 101 mL/min (by C-G formula based on SCr of 0.67 mg/dL).  CMP Latest Ref Rng & Units 07/07/2021 06/30/2021 06/23/2021  Glucose 70 - 99 mg/dL 91 80 66(L)  BUN 6 - 20 mg/dL 15 21(H) 14  Creatinine 0.61 - 1.24 mg/dL 0.67 0.66 0.72  Sodium 135 - 145 mmol/L 138 136 139  Potassium 3.5 - 5.1 mmol/L 4.0 4.6 4.1  Chloride 98 - 111 mmol/L 103 104 104  CO2 22 - 32 mmol/L '28 28 31  '$ Calcium 8.9 - 10.3 mg/dL 9.9 9.6 10.1  Total Protein 6.5 - 8.1 g/dL - - -  Total Bilirubin 0.3 - 1.2 mg/dL - - -  Alkaline Phos 38 - 126 U/L - - -  AST 15 - 41 U/L - - -  ALT 0 - 44 U/L - - -   CBC Latest Ref Rng & Units 07/07/2021 06/30/2021 06/23/2021  WBC 4.0 - 10.5 K/uL 2.7(L) 2.0(L) 2.2(L)  Hemoglobin 13.0 - 17.0 g/dL 11.9(L) 10.6(L) 9.9(L)  Hematocrit 39.0 - 52.0 % 35.9(L) 32.5(L) 30.3(L)  Platelets 150 - 400 K/uL 175 229 152    Lipid Panel No results for input(s): CHOL, TRIG, LDLCALC, VLDL, HDL, CHOLHDL, LDLDIRECT in the last 8760 hours.  HEMOGLOBIN A1C No results found for: HGBA1C, MPG TSH No results for input(s): TSH in the last 8760 hours.  External labs:  None   Allergies   Allergies  Allergen Reactions   Ciprofloxacin Rash   Diflucan [Fluconazole] Rash   Famvir [Famciclovir] Rash    Medications Prior to Visit:   Outpatient Medications Prior to Visit  Medication Sig Dispense Refill   azaCITIDine (VIDAZA) 100 MG SUSR Vidaza 142.5 mg (rounded from 143.25 mg)=75 mg/m2 x 1.91 m2 daily times five days every twenty eight days (Patient taking differently: Inject 100 mg into the vein See admin  instructions. Vidaza 142.5 mg (rounded from 143.25 mg)=75 mg/m2 x 1.91 m2 daily times five days every twenty eight days) 1 each 0   carvedilol (COREG) 3.125 MG tablet Take 1 tablet (3.125 mg total) by mouth 2 (two) times daily with a meal. 180 tablet 3   digoxin (LANOXIN) 0.25 MG tablet Take 1 tablet (0.25 mg total) by mouth daily. 90 tablet 3   Multiple Vitamins-Iron (MULTIVITAMIN/IRON PO) Take 1 tablet by mouth daily.     prochlorperazine (COMPAZINE) 10 MG tablet Take 1 tablet (  10 mg total) by mouth every 6 (six) hours as needed (Nausea or vomiting). (Patient taking differently: Take 10 mg by mouth every 6 (six) hours as needed for nausea or vomiting (Nausea or vomiting).) 30 tablet 1   traMADol (ULTRAM) 50 MG tablet Take 2 tablets (100 mg total) by mouth every 6 (six) hours as needed. 1-2 tablets ('50mg'$ - '100mg'$ ) by mouth every 6 hours as needed 60 tablet 0   ivabradine (CORLANOR) 5 MG TABS tablet Take 1 tablet (5 mg total) by mouth 2 (two) times daily with a meal. 60 tablet 1   sacubitril-valsartan (ENTRESTO) 24-26 MG Take 0.5 tablets by mouth 2 (two) times daily. 180 tablet 3   No facility-administered medications prior to visit.   Final Medications at End of Visit    Current Meds  Medication Sig   azaCITIDine (VIDAZA) 100 MG SUSR Vidaza 142.5 mg (rounded from 143.25 mg)=75 mg/m2 x 1.91 m2 daily times five days every twenty eight days (Patient taking differently: Inject 100 mg into the vein See admin instructions. Vidaza 142.5 mg (rounded from 143.25 mg)=75 mg/m2 x 1.91 m2 daily times five days every twenty eight days)   carvedilol (COREG) 3.125 MG tablet Take 1 tablet (3.125 mg total) by mouth 2 (two) times daily with a meal.   digoxin (LANOXIN) 0.25 MG tablet Take 1 tablet (0.25 mg total) by mouth daily.   Multiple Vitamins-Iron (MULTIVITAMIN/IRON PO) Take 1 tablet by mouth daily.   prochlorperazine (COMPAZINE) 10 MG tablet Take 1 tablet (10 mg total) by mouth every 6 (six) hours as needed  (Nausea or vomiting). (Patient taking differently: Take 10 mg by mouth every 6 (six) hours as needed for nausea or vomiting (Nausea or vomiting).)   traMADol (ULTRAM) 50 MG tablet Take 2 tablets (100 mg total) by mouth every 6 (six) hours as needed. 1-2 tablets ('50mg'$ - '100mg'$ ) by mouth every 6 hours as needed   [DISCONTINUED] ivabradine (CORLANOR) 5 MG TABS tablet Take 1 tablet (5 mg total) by mouth 2 (two) times daily with a meal.   [DISCONTINUED] sacubitril-valsartan (ENTRESTO) 24-26 MG Take 0.5 tablets by mouth 2 (two) times daily.   Radiology:   No results found. CT angio chest 03/26/2021: 1. No pulmonary embolus. 2. Patchy, ground-glass, and confluent opacities in the right greater than left lower lobe. There also subpleural opacities involving the anterior left greater than right upper lobes. Findings are suspicious for multifocal pneumonia. Recommend radiographic follow-up to resolution. 3. Prominent right greater than left hilar nodes and prominent subcarinal node, likely reactive. 4. Borderline cardiomegaly.  Coronary artery calcifications. Aortic Atherosclerosis (ICD10-I70.0).  Chest x-ray 03/29/2021: Persistent bibasilar opacities without substantial change. No significant pleural effusion. No pneumothorax. Stable cardiomediastinal contours.  Cardiac Studies:   Echocardiogram 03/27/2021:  1. Left ventricular ejection fraction, by estimation, is 45 to 50%. The  left ventricle has mildly decreased function. The left ventricle  demonstrates global hypokinesis. The left ventricular internal cavity size  was mildly dilated. Left ventricular  diastolic parameters are consistent with Grade II diastolic dysfunction  (pseudonormalization). Elevated left atrial pressure.   2. Right ventricular systolic function is normal. The right ventricular  size is mildly enlarged. There is mildly elevated pulmonary artery  systolic pressure. The estimated right ventricular systolic pressure is   [id number] mmHg.   3. Left atrial size was mildly dilated.   4. The mitral valve is grossly normal. Trivial mitral valve  regurgitation. No evidence of mitral stenosis.   5. The aortic valve is tricuspid. There is  moderate calcification of the  aortic valve. There is moderate thickening of the aortic valve. Aortic  valve regurgitation is mild to moderate. Moderate aortic valve stenosis.  Aortic valve area, by VTI measures  1.28 cm. Aortic valve mean gradient measures 23.3 mmHg. Aortic valve Vmax  measures 3.12 m/s.   6. The inferior vena cava is normal in size with greater than 50%  respiratory variability, suggesting right atrial pressure of 3 mmHg.  Upon personal review by Dr. Einar Gip felt LVEF to be 30-35% as opposed to 45-50%.   Echocardiogram 06/24/2021 (external): There is normal left ventricular wall thickness. The left ventricular size is normal. LV ejection fraction = 50-55%. Left ventricular systolic function is normal. Left ventricular filling pattern is pseudonormal. The left ventricular wall motion is normal. The right ventricle is normal in size and function. The left atrium is mildly dilated. The right atrium is mildly dilated. Diffuse thickening of the aortic valve with restricted cusp opening. There is moderate aortic stenosis. There is moderate aortic regurgitation. There is an eccentric jet of aortic insufficiency directed against the anterior mitral leaflet. Due to the eccentric nature of the regurgitant jet, it may be under- estimated. The aortic sinus is normal size. The inferior vena cava is moderately dilated with a decrease in inspiratory collapse. There is no pericardial effusion. There is no comparison study available.  EKG:   EKG 04/11/2021: Sinus rhythm at a rate of 90 bpm.  Biatrial enlargement.  Normal axis.  LVH with secondary ST-T wave changes.  EKG 03/19/2021: Sinus tachycardia with single PVC at a rate of 106 bpm.  Normal axis.  Nonspecific T wave  abnormality.  Assessment     ICD-10-CM   1. Chronic combined systolic and diastolic heart failure (HCC)  I50.42     2. Severe aortic stenosis  I35.0        Medications Discontinued During This Encounter  Medication Reason   ivabradine (CORLANOR) 5 MG TABS tablet Patient has not taken in last 30 days    No orders of the defined types were placed in this encounter.   Recommendations:   Thomas Frazier is a 55 y.o. 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  Patient denies significant cardiovascular risk factors.  No history of hypertension, hyperlipidemia, diabetes, no history of premature CAD. No known history of CAD.  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  Guideline directed medical therapy was initiated including carvedilol and Entresto, as well as digoxin and Lasix.  Patient responded well to diuresis.  Suspected severe aortic stenosis to be etiology of cardiomyopathy and has no significant troponin leak suspect this is chronic.  Patient presents for 4-week follow-up of heart failure and aortic stenosis.  Last visit added Corlanor 5 mg twice daily, however patient did not start this due to high cost.  He has been monitoring heart rate at home which has improved since last visit.  We will therefore hold off on starting Corlanor at this time.  Patient overall is feeling much better.  Patient's blood pressure remains soft, however he is asymptomatic without recurrence of dizziness or lightheadedness.  LVEF has now normalized.  We will continue carvedilol, Entresto, and digoxin.  Discussed again with patient regarding aortic stenosis and possible surgical  intervention.  We will  discuss further with Dr. Einar Gip as well as patient's oncology team. For now will continue with medical optimization. There are no clinical signs of acute hart failure at this time.   Follow-up in 2 months, sooner if needed, for heart failure and aortic stenosis.   [name], PA-C 07/09/2021, 12:17 PM Office: [phone]  Addendum 07/09/21: Reviewed and discussed with Dr. Einar Gip. When we first saw patient in 02/2021 LVEF was down at 30-35% and felt he had severe low flow low gradient aortic stenosis. Since then patient has recovered well and has been optimized medically. Recent repeat echocardiogram at [facility] revealed LVEF has normalized and aortic stenosis and regurgitation are both moderate at most.   As patient's LVEF and symptoms have significantly improved will continue medical management. No indication for aortic valve intervention at this time. Will obtain stress test and if it is low risk patient may proceed with bone marrow transplant at low risk.    [name], PA-C 07/09/2021, 1:16 PM Office: [phone]

## 2021-07-09 NOTE — Addendum Note (Signed)
Addended by: Lawerance Cruel L on: 07/09/2021 01:16 PM   Modules accepted: Orders

## 2021-07-14 ENCOUNTER — Inpatient Hospital Stay: Payer: PRIVATE HEALTH INSURANCE

## 2021-07-14 ENCOUNTER — Encounter: Payer: Self-pay | Admitting: Hematology & Oncology

## 2021-07-14 ENCOUNTER — Inpatient Hospital Stay (HOSPITAL_BASED_OUTPATIENT_CLINIC_OR_DEPARTMENT_OTHER): Payer: PRIVATE HEALTH INSURANCE | Admitting: Hematology & Oncology

## 2021-07-14 ENCOUNTER — Other Ambulatory Visit: Payer: Self-pay

## 2021-07-14 VITALS — BP 106/61 | HR 71 | Temp 98.2°F | Resp 18 | Wt 156.0 lb

## 2021-07-14 DIAGNOSIS — Z5111 Encounter for antineoplastic chemotherapy: Secondary | ICD-10-CM | POA: Diagnosis not present

## 2021-07-14 DIAGNOSIS — D46Z Other myelodysplastic syndromes: Secondary | ICD-10-CM

## 2021-07-14 LAB — SAMPLE TO BLOOD BANK

## 2021-07-14 LAB — CBC WITH DIFFERENTIAL (CANCER CENTER ONLY)
Abs Immature Granulocytes: 0.04 10*3/uL (ref 0.00–0.07)
Basophils Absolute: 0 10*3/uL (ref 0.0–0.1)
Basophils Relative: 0 %
Eosinophils Absolute: 0.1 10*3/uL (ref 0.0–0.5)
Eosinophils Relative: 1 %
HCT: 37.2 % — ABNORMAL LOW (ref 39.0–52.0)
Hemoglobin: 12.5 g/dL — ABNORMAL LOW (ref 13.0–17.0)
Immature Granulocytes: 1 %
Lymphocytes Relative: 31 %
Lymphs Abs: 2 10*3/uL (ref 0.7–4.0)
MCH: 35.4 pg — ABNORMAL HIGH (ref 26.0–34.0)
MCHC: 33.6 g/dL (ref 30.0–36.0)
MCV: 105.4 fL — ABNORMAL HIGH (ref 80.0–100.0)
Monocytes Absolute: 1.8 10*3/uL — ABNORMAL HIGH (ref 0.1–1.0)
Monocytes Relative: 27 %
Neutro Abs: 2.6 10*3/uL (ref 1.7–7.7)
Neutrophils Relative %: 40 %
Platelet Count: 143 10*3/uL — ABNORMAL LOW (ref 150–400)
RBC: 3.53 MIL/uL — ABNORMAL LOW (ref 4.22–5.81)
RDW: 18.3 % — ABNORMAL HIGH (ref 11.5–15.5)
WBC Count: 6.6 10*3/uL (ref 4.0–10.5)
nRBC: 0.6 % — ABNORMAL HIGH (ref 0.0–0.2)

## 2021-07-14 LAB — CMP (CANCER CENTER ONLY)
ALT: 16 U/L (ref 0–44)
AST: 14 U/L — ABNORMAL LOW (ref 15–41)
Albumin: 4.1 g/dL (ref 3.5–5.0)
Alkaline Phosphatase: 135 U/L — ABNORMAL HIGH (ref 38–126)
Anion gap: 6 (ref 5–15)
BUN: 15 mg/dL (ref 6–20)
CO2: 30 mmol/L (ref 22–32)
Calcium: 10.2 mg/dL (ref 8.9–10.3)
Chloride: 103 mmol/L (ref 98–111)
Creatinine: 0.76 mg/dL (ref 0.61–1.24)
GFR, Estimated: >60 mL/min (ref >60)
Glucose, Bld: 75 mg/dL (ref 70–99)
Potassium: 4.4 mmol/L (ref 3.5–5.1)
Sodium: 139 mmol/L (ref 135–145)
Total Bilirubin: 0.5 mg/dL (ref 0.3–1.2)
Total Protein: 7.7 g/dL (ref 6.5–8.1)

## 2021-07-14 LAB — LACTATE DEHYDROGENASE: LDH: 143 U/L (ref 98–192)

## 2021-07-14 LAB — IRON AND TIBC
Iron: 63 ug/dL (ref 42–163)
Saturation Ratios: 28 % (ref 20–55)
TIBC: 224 ug/dL (ref 202–409)
UIBC: 161 ug/dL (ref 117–376)

## 2021-07-14 LAB — RETICULOCYTES
Immature Retic Fract: 12.7 % (ref 2.3–15.9)
RBC.: 3.52 MIL/uL — ABNORMAL LOW (ref 4.22–5.81)
Retic Count, Absolute: 58.1 10*3/uL (ref 19.0–186.0)
Retic Ct Pct: 1.7 % (ref 0.4–3.1)

## 2021-07-14 LAB — SAVE SMEAR(SSMR), FOR PROVIDER SLIDE REVIEW

## 2021-07-14 LAB — FERRITIN: Ferritin: 764 ng/mL — ABNORMAL HIGH (ref 24–336)

## 2021-07-14 MED ORDER — AZACITIDINE CHEMO SQ INJECTION
75.0000 mg/m2 | Freq: Once | INTRAMUSCULAR | Status: AC
  Administered 2021-07-14: 142.5 mg via SUBCUTANEOUS
  Filled 2021-07-14: qty 5.7

## 2021-07-14 NOTE — Progress Notes (Signed)
Hematology and Oncology Follow Up Visit  Thomas Frazier [id number] November 10, 1966 55 y.o. 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  He looks fantastic.  He has responded, finally, into treatment.  His blood counts are normal.  I am so happy for him.  I doubt that we will have to transfuse him for a while now.  It looks like there might be a brother who lives in [place] who has a Orthoptist for the transplant.  Unfortunately, Thomas Frazier has had his insurance does not cover transplants.  I am sure that [facility] will help figure this out.  He still has the cardiac issue.  He has a aortic valve stenosis.  With his blood counts being normal, he could certainly undergo valve surgery at this point in time.  He has had no problems with fever.  He has had no cough or shortness of breath.  He has had no nausea or vomiting.  There has been no change in bowel or bladder habits.  He has had no issues with fever.  He has had no leg swelling.  Overall, his performance status is ECOG 1.       Medications:  Allergies as of 07/14/2021       Reactions   Ciprofloxacin Rash   Diflucan [fluconazole] Rash   Famvir [famciclovir] Rash        Medication List        Accurate as of July 14, 2021  9:44 AM. If you have any questions, ask your nurse or doctor.          azaCITIDine 100 MG Susr Commonly known as: VIDAZA Vidaza 142.5 mg (rounded from 143.25 mg)=75 mg/m2 x 1.91 m2 daily times five days every twenty eight days What changed:  how much to take how to take this when to take this   carvedilol 3.125 MG tablet Commonly known as: COREG Take 1 tablet (3.125 mg total)  by mouth 2 (two) times daily with a meal.   digoxin 0.25 MG tablet Commonly known as: LANOXIN Take 1 tablet (0.25 mg total) by mouth daily.   MULTIVITAMIN/IRON PO Take 1 tablet by mouth daily.   prochlorperazine 10 MG tablet Commonly known as: COMPAZINE Take 1 tablet (10 mg total) by mouth every 6 (six) hours as needed (Nausea or vomiting). What changed: reasons to take this   sacubitril-valsartan 24-26 MG Commonly known as: ENTRESTO Take 0.5 tablets by mouth 2 (two) times daily.   traMADol 50 MG tablet Commonly known as: ULTRAM Take 2 tablets (100 mg total) by mouth every 6 (six) hours as needed. 1-2 tablets ('50mg'$ - '100mg'$ ) by mouth every 6 hours as needed        Allergies:  Allergies  Allergen Reactions   Ciprofloxacin Rash   Diflucan [Fluconazole] Rash   Famvir [Famciclovir] Rash    Past Medical History, Surgical history, Social history, and Family History were reviewed and updated.  Review of Systems: Review of Systems  Constitutional:  Positive for malaise/fatigue. Negative for diaphoresis.  HENT: Negative.    Eyes: Negative.   Respiratory:  Positive for shortness of breath.   Cardiovascular: Negative.   Gastrointestinal: Negative.  Genitourinary: Negative.   Musculoskeletal:  Positive for joint pain.  Skin: Negative.   Neurological: Negative.   Endo/Heme/Allergies: Negative.   Psychiatric/Behavioral: Negative.      Physical Exam:  weight is 156 lb (70.8 kg). His oral temperature is 98.2 F (36.8 C). His blood pressure is 106/61 and his pulse is 71. His respiration is 18.   Wt Readings from Last 3 Encounters:  07/14/21 156 lb (70.8 kg)  07/09/21 154 lb 9.6 oz (70.1 kg)  06/04/21 150 lb (68 kg)    Physical Exam Vitals reviewed.  HENT:     Head: Normocephalic and atraumatic.  Eyes:     Pupils: Pupils are equal, round, and reactive to light.  Cardiovascular:     Rate and Rhythm: Normal rate and regular rhythm.     Heart sounds: Normal heart  sounds.     Comments: Cardiac exam shows a regular rate and rhythm.  He has a 3/6 systolic ejection murmur consistent with aortic stenosis. Pulmonary:     Effort: Pulmonary effort is normal.     Breath sounds: Normal breath sounds.  Abdominal:     General: Bowel sounds are normal.     Palpations: Abdomen is soft.  Musculoskeletal:        General: No tenderness or deformity. Normal range of motion.     Cervical back: Normal range of motion.  Lymphadenopathy:     Cervical: No cervical adenopathy.  Skin:    General: Skin is warm and dry.     Findings: No erythema or rash.  Neurological:     Mental Status: He is alert and oriented to person, place, and time.  Psychiatric:        Behavior: Behavior normal.        Thought Content: Thought content normal.        Judgment: Judgment normal.    Lab Results  Component Value Date   WBC 6.6 07/14/2021   HGB 12.5 (L) 07/14/2021   HCT 37.2 (L) 07/14/2021   MCV 105.4 (H) 07/14/2021   PLT 143 (L) 07/14/2021   Lab Results  Component Value Date   FERRITIN 864 (H) 05/19/2021   IRON 224 (H) 05/19/2021   TIBC 261 05/19/2021   UIBC 37 05/19/2021   IRONPCTSAT 86 (H) 05/19/2021   Lab Results  Component Value Date   RETICCTPCT 1.7 07/14/2021   RBC 3.53 (L) 07/14/2021   RBC 3.52 (L) 07/14/2021   No results found for: KPAFRELGTCHN, LAMBDASER, KAPLAMBRATIO No results found for: IGGSERUM, IGA, IGMSERUM No results found for: Ronnald Ramp, A1GS, A2GS, Tillman Sers, SPEI   Chemistry      Component Value Date/Time   NA 139 07/14/2021 0823   K 4.4 07/14/2021 0823   CL 103 07/14/2021 0823   CO2 30 07/14/2021 0823   BUN 15 07/14/2021 0823   CREATININE 0.76 07/14/2021 0823      Component Value Date/Time   CALCIUM 10.2 07/14/2021 0823   ALKPHOS 135 (H) 07/14/2021 0823   AST 14 (L) 07/14/2021 0823   ALT 16 07/14/2021 0823   BILITOT 0.5 07/14/2021 0823       Impression and Plan: Thomas Frazier is a very pleasant 55  yo Hispanic gentleman with high grade myelodysplasia, complex cytogenetics.   We will go ahead with his fifth cycle of treatment.    He says he had a bone marrow test done at [facility].  I will have to try to find those results.  Again, the Vidaza, by  itself, is doing a wonderful job with his blood counts.  They are normal.  I would have to believe that his bone marrow should also be improved.  He says he sees Dr. Mariea Clonts at [facility] in a week or so.  Again if he needs to have aortic valve surgery, I do not see a problem with him having this some at this point.  We will go ahead and plan to get him back to see [place] in another month.    He can always come in between visits to check his blood counts if he thinks he needs it.      [name], MD 8/15/20229:44 AM

## 2021-07-14 NOTE — Patient Instructions (Signed)
[place]  Discharge Instructions: Thank you for choosing [facility] to provide your oncology and hematology care.   If you have a lab appointment with the [facility], please go directly to the [facility] and check in at the registration area.  Wear comfortable clothing and clothing appropriate for easy access to any Portacath or PICC line.   We strive to give you quality time with your provider. You may need to reschedule your appointment if you arrive late (15 or more minutes).  Arriving late affects you and other patients whose appointments are after yours.  Also, if you miss three or more appointments without notifying the office, you may be dismissed from the clinic at the provider's discretion.      For prescription refill requests, have your pharmacy contact our office and allow 72 hours for refills to be completed.    Today you received the following chemotherapy and/or immunotherapy agents 5 azacitadine   To help prevent nausea and vomiting after your treatment, we encourage you to take your nausea medication as directed.  BELOW ARE SYMPTOMS THAT SHOULD BE REPORTED IMMEDIATELY: *FEVER GREATER THAN 100.4 F (38 C) OR HIGHER *CHILLS OR SWEATING *NAUSEA AND VOMITING THAT IS NOT CONTROLLED WITH YOUR NAUSEA MEDICATION *UNUSUAL SHORTNESS OF BREATH *UNUSUAL BRUISING OR BLEEDING *URINARY PROBLEMS (pain or burning when urinating, or frequent urination) *BOWEL PROBLEMS (unusual diarrhea, constipation, pain near the anus) TENDERNESS IN MOUTH AND THROAT WITH OR WITHOUT PRESENCE OF ULCERS (sore throat, sores in mouth, or a toothache) UNUSUAL RASH, SWELLING OR PAIN  UNUSUAL VAGINAL DISCHARGE OR ITCHING   Items with * indicate a potential emergency and should be followed up as soon as possible or go to the Emergency Department if any problems should occur.  Please show the CHEMOTHERAPY ALERT CARD or IMMUNOTHERAPY ALERT CARD at check-in to the  Emergency Department and triage nurse. Should you have questions after your visit or need to cancel or reschedule your appointment, please contact [employer]  [phone] and follow the prompts.  Office hours are 8:00 a.m. to 4:30 p.m. Monday - Friday. Please note that voicemails left after 4:00 p.m. may not be returned until the following business day.  We are closed weekends and major holidays. You have access to a nurse at all times for urgent questions. Please call the main number to the clinic [phone] and follow the prompts.  For any non-urgent questions, you may also contact your provider using MyChart. We now offer e-Visits for anyone 38 and older to request care online for non-urgent symptoms. For details visit [email or web address].   Also download the MyChart app! Go to the app store, search "MyChart", open the app, select [facility], and log in with your MyChart username and password.  Due to Covid, a mask is required upon entering the hospital/clinic. If you do not have a mask, one will be given to you upon arrival. For doctor visits, patients may have 1 support person aged 73 or older with them. For treatment visits, patients cannot have anyone with them due to current Covid guidelines and our immunocompromised population.

## 2021-07-14 NOTE — Progress Notes (Signed)
Pt. is accompanied by interpreter Joaquim Lai from [facility].

## 2021-07-15 ENCOUNTER — Inpatient Hospital Stay: Payer: PRIVATE HEALTH INSURANCE

## 2021-07-15 ENCOUNTER — Other Ambulatory Visit: Payer: Self-pay

## 2021-07-15 VITALS — BP 114/63 | HR 73 | Temp 98.2°F | Resp 18

## 2021-07-15 DIAGNOSIS — D46Z Other myelodysplastic syndromes: Secondary | ICD-10-CM

## 2021-07-15 DIAGNOSIS — Z5111 Encounter for antineoplastic chemotherapy: Secondary | ICD-10-CM | POA: Diagnosis not present

## 2021-07-15 MED ORDER — ONDANSETRON HCL 8 MG PO TABS
8.0000 mg | ORAL_TABLET | Freq: Once | ORAL | Status: DC
  Filled 2021-07-15: qty 1

## 2021-07-15 MED ORDER — AZACITIDINE CHEMO SQ INJECTION
75.0000 mg/m2 | Freq: Once | INTRAMUSCULAR | Status: AC
  Administered 2021-07-15: 142.5 mg via SUBCUTANEOUS
  Filled 2021-07-15: qty 5.7

## 2021-07-15 MED ORDER — MAGIC MOUTHWASH W/LIDOCAINE: 5.0000 mL | Freq: Three times a day (TID) | ORAL | 0 refills | Status: DC | PRN

## 2021-07-15 NOTE — Patient Instructions (Signed)
Azacitidine suspension for injection (subcutaneous use) What is this medication? AZACITIDINE (ay za SITE i deen) is a chemotherapy drug. This medicine reduces the growth of cancer cells and can suppress the immune system. It is used fortreating myelodysplastic syndrome or some types of leukemia. This medicine may be used for other purposes; ask your health care provider orpharmacist if you have questions. COMMON BRAND NAME(S): Vidaza What should I tell my care team before I take this medication? They need to know if you have any of these conditions: kidney disease liver disease liver tumors an unusual or allergic reaction to azacitidine, mannitol, other medicines, foods, dyes, or preservatives pregnant or trying to get pregnant breast-feeding How should I use this medication? This medicine is for injection under the skin. It is administered in a hospitalor clinic by a specially trained health care professional. Talk to your pediatrician regarding the use of this medicine in children. Whilethis drug may be prescribed for selected conditions, precautions do apply. Overdosage: If you think you have taken too much of this medicine contact apoison control center or emergency room at once. NOTE: This medicine is only for you. Do not share this medicine with others. What if I miss a dose? It is important not to miss your dose. Call your doctor or health careprofessional if you are unable to keep an appointment. What may interact with this medication? Interactions have not been studied. Give your health care provider a list of all the medicines, herbs, non-prescription drugs, or dietary supplements you use. Also tell them if you smoke, drink alcohol, or use illegal drugs. Some items may interact with yourmedicine. This list may not describe all possible interactions. Give your health care provider a list of all the medicines, herbs, non-prescription drugs, or dietary supplements you use. Also tell  them if you smoke, drink alcohol, or use illegaldrugs. Some items may interact with your medicine. What should I watch for while using this medication? Visit your doctor for checks on your progress. This drug may make you feel generally unwell. This is not uncommon, as chemotherapy can affect healthy cells as well as cancer cells. Report any side effects. Continue your course oftreatment even though you feel ill unless your doctor tells you to stop. In some cases, you may be given additional medicines to help with side effects.Follow all directions for their use. Call your doctor or health care professional for advice if you get a fever, chills or sore throat, or other symptoms of a cold or flu. Do not treat yourself. This drug decreases your body's ability to fight infections. Try toavoid being around people who are sick. This medicine may increase your risk to bruise or bleed. Call your doctor orhealth care professional if you notice any unusual bleeding. You may need blood work done while you are taking this medicine. Do not become pregnant while taking this medicine and for 6 months after the last dose. Women should inform their doctor if they wish to become pregnant or think they might be pregnant. Men should not father a child while taking this medicine and for 3 months after the last dose. There is a potential for serious side effects to an unborn child. Talk to your health care professional or pharmacist for more information. Do not breast-feed an infant while taking thismedicine and for 1 week after the last dose. This medicine may interfere with the ability to have a child. Talk with yourdoctor or health care professional if you are concerned about your fertility.   What side effects may I notice from receiving this medication? Side effects that you should report to your doctor or health care professionalas soon as possible: allergic reactions like skin rash, itching or hives, swelling of the  face, lips, or tongue low blood counts - this medicine may decrease the number of white blood cells, red blood cells and platelets. You may be at increased risk for infections and bleeding. signs of infection - fever or chills, cough, sore throat, pain passing urine signs of decreased platelets or bleeding - bruising, pinpoint red spots on the skin, black, tarry stools, blood in the urine signs of decreased red blood cells - unusually weak or tired, fainting spells, lightheadedness signs and symptoms of kidney injury like trouble passing urine or change in the amount of urine signs and symptoms of liver injury like dark yellow or brown urine; general ill feeling or flu-like symptoms; light-colored stools; loss of appetite; nausea; right upper belly pain; unusually weak or tired; yellowing of the eyes or skin Side effects that usually do not require medical attention (report to yourdoctor or health care professional if they continue or are bothersome): constipation diarrhea nausea, vomiting pain or redness at the injection site unusually weak or tired This list may not describe all possible side effects. Call your doctor for medical advice about side effects. You may report side effects to FDA at1-800-FDA-1088. Where should I keep my medication? This drug is given in a hospital or clinic and will not be stored at home. NOTE: This sheet is a summary. It may not cover all possible information. If you have questions about this medicine, talk to your doctor, pharmacist, orhealth care provider.  2022 Elsevier/Gold Standard (2016-12-15 14:37:51)  

## 2021-07-16 ENCOUNTER — Other Ambulatory Visit: Payer: Self-pay

## 2021-07-16 ENCOUNTER — Inpatient Hospital Stay: Payer: PRIVATE HEALTH INSURANCE

## 2021-07-16 VITALS — BP 105/56 | HR 80 | Temp 98.0°F | Resp 18

## 2021-07-16 DIAGNOSIS — D46Z Other myelodysplastic syndromes: Secondary | ICD-10-CM

## 2021-07-16 DIAGNOSIS — Z5111 Encounter for antineoplastic chemotherapy: Secondary | ICD-10-CM | POA: Diagnosis not present

## 2021-07-16 MED ORDER — AZACITIDINE CHEMO SQ INJECTION
75.0000 mg/m2 | Freq: Once | INTRAMUSCULAR | Status: AC
  Administered 2021-07-16: 142.5 mg via SUBCUTANEOUS
  Filled 2021-07-16: qty 5.7

## 2021-07-16 NOTE — Patient Instructions (Signed)
[facility] [place]  Discharge Instructions: Thank you for choosing [facility] to provide your oncology and hematology care.   If you have a lab appointment with the [facility], please go directly to the [facility] and check in at the registration area.  Wear comfortable clothing and clothing appropriate for easy access to any Portacath or PICC line.   We strive to give you quality time with your provider. You may need to reschedule your appointment if you arrive late (15 or more minutes).  Arriving late affects you and other patients whose appointments are after yours.  Also, if you miss three or more appointments without notifying the office, you may be dismissed from the clinic at the provider's discretion.      For prescription refill requests, have your pharmacy contact our office and allow 72 hours for refills to be completed.    Today you received the following chemotherapy and/or immunotherapy agents Vidaza.    To help prevent nausea and vomiting after your treatment, we encourage you to take your nausea medication as directed.  BELOW ARE SYMPTOMS THAT SHOULD BE REPORTED IMMEDIATELY: *FEVER GREATER THAN 100.4 F (38 C) OR HIGHER *CHILLS OR SWEATING *NAUSEA AND VOMITING THAT IS NOT CONTROLLED WITH YOUR NAUSEA MEDICATION *UNUSUAL SHORTNESS OF BREATH *UNUSUAL BRUISING OR BLEEDING *URINARY PROBLEMS (pain or burning when urinating, or frequent urination) *BOWEL PROBLEMS (unusual diarrhea, constipation, pain near the anus) TENDERNESS IN MOUTH AND THROAT WITH OR WITHOUT PRESENCE OF ULCERS (sore throat, sores in mouth, or a toothache) UNUSUAL RASH, SWELLING OR PAIN  UNUSUAL VAGINAL DISCHARGE OR ITCHING   Items with * indicate a potential emergency and should be followed up as soon as possible or go to the Emergency Department if any problems should occur.  Please show the CHEMOTHERAPY ALERT CARD or IMMUNOTHERAPY ALERT CARD at check-in to the  Emergency Department and triage nurse. Should you have questions after your visit or need to cancel or reschedule your appointment, please contact [facility]  [phone] and follow the prompts.  Office hours are 8:00 a.m. to 4:30 p.m. Monday - Friday. Please note that voicemails left after 4:00 p.m. may not be returned until the following business day.  We are closed weekends and major holidays. You have access to a nurse at all times for urgent questions. Please call the main number to the clinic [phone] and follow the prompts.  For any non-urgent questions, you may also contact your provider using MyChart. We now offer e-Visits for anyone 38 and older to request care online for non-urgent symptoms. For details visit [email or web address].   Also download the MyChart app! Go to the app store, search "MyChart", open the app, select [facility], and log in with your MyChart username and password.  Due to Covid, a mask is required upon entering the hospital/clinic. If you do not have a mask, one will be given to you upon arrival. For doctor visits, patients may have 1 support person aged 19 or older with them. For treatment visits, patients cannot have anyone with them due to current Covid guidelines and our immunocompromised population.

## 2021-07-17 ENCOUNTER — Inpatient Hospital Stay: Payer: PRIVATE HEALTH INSURANCE

## 2021-07-17 VITALS — BP 105/52 | HR 75 | Temp 97.7°F | Resp 17

## 2021-07-17 DIAGNOSIS — Z5111 Encounter for antineoplastic chemotherapy: Secondary | ICD-10-CM | POA: Diagnosis not present

## 2021-07-17 DIAGNOSIS — D46Z Other myelodysplastic syndromes: Secondary | ICD-10-CM

## 2021-07-17 MED ORDER — ONDANSETRON HCL 8 MG PO TABS: 8.0000 mg | ORAL_TABLET | Freq: Once | ORAL | Status: DC

## 2021-07-17 MED ORDER — AZACITIDINE CHEMO SQ INJECTION
75.0000 mg/m2 | Freq: Once | INTRAMUSCULAR | Status: AC
  Administered 2021-07-17: 142.5 mg via SUBCUTANEOUS
  Filled 2021-07-17: qty 5.7

## 2021-07-17 NOTE — Patient Instructions (Signed)
[facility] [place]  Discharge Instructions: Thank you for choosing [facility] to provide your oncology and hematology care.   If you have a lab appointment with the [facility], please go directly to the [facility] and check in at the registration area.  Wear comfortable clothing and clothing appropriate for easy access to any Portacath or PICC line.   We strive to give you quality time with your provider. You may need to reschedule your appointment if you arrive late (15 or more minutes).  Arriving late affects you and other patients whose appointments are after yours.  Also, if you miss three or more appointments without notifying the office, you may be dismissed from the clinic at the provider's discretion.      For prescription refill requests, have your pharmacy contact our office and allow 72 hours for refills to be completed.    Today you received the following chemotherapy and/or immunotherapy agents Vidaza      To help prevent nausea and vomiting after your treatment, we encourage you to take your nausea medication as directed.  BELOW ARE SYMPTOMS THAT SHOULD BE REPORTED IMMEDIATELY: *FEVER GREATER THAN 100.4 F (38 C) OR HIGHER *CHILLS OR SWEATING *NAUSEA AND VOMITING THAT IS NOT CONTROLLED WITH YOUR NAUSEA MEDICATION *UNUSUAL SHORTNESS OF BREATH *UNUSUAL BRUISING OR BLEEDING *URINARY PROBLEMS (pain or burning when urinating, or frequent urination) *BOWEL PROBLEMS (unusual diarrhea, constipation, pain near the anus) TENDERNESS IN MOUTH AND THROAT WITH OR WITHOUT PRESENCE OF ULCERS (sore throat, sores in mouth, or a toothache) UNUSUAL RASH, SWELLING OR PAIN  UNUSUAL VAGINAL DISCHARGE OR ITCHING   Items with * indicate a potential emergency and should be followed up as soon as possible or go to the Emergency Department if any problems should occur.  Please show the CHEMOTHERAPY ALERT CARD or IMMUNOTHERAPY ALERT CARD at check-in to the  Emergency Department and triage nurse. Should you have questions after your visit or need to cancel or reschedule your appointment, please contact [facility]  [phone] and follow the prompts.  Office hours are 8:00 a.m. to 4:30 p.m. Monday - Friday. Please note that voicemails left after 4:00 p.m. may not be returned until the following business day.  We are closed weekends and major holidays. You have access to a nurse at all times for urgent questions. Please call the main number to the clinic [phone] and follow the prompts.  For any non-urgent questions, you may also contact your provider using MyChart. We now offer e-Visits for anyone 16 and older to request care online for non-urgent symptoms. For details visit [email or web address].   Also download the MyChart app! Go to the app store, search "MyChart", open the app, select [facility], and log in with your MyChart username and password.  Due to Covid, a mask is required upon entering the hospital/clinic. If you do not have a mask, one will be given to you upon arrival. For doctor visits, patients may have 1 support person aged 35 or older with them. For treatment visits, patients cannot have anyone with them due to current Covid guidelines and our immunocompromised population. [facility] [place]  Discharge Instructions: Thank you for choosing [facility] to provide your oncology and hematology care.   If you have a lab appointment with the [facility], please go directly to the [facility] and check in at the registration area.  Wear comfortable clothing  and clothing appropriate for easy access to any Portacath or PICC line.   We strive to give you quality time with your provider. You may need to reschedule your appointment if you arrive late (15 or more minutes).  Arriving late affects you and other patients whose appointments are after yours.  Also, if you miss three  or more appointments without notifying the office, you may be dismissed from the clinic at the provider's discretion.      For prescription refill requests, have your pharmacy contact our office and allow 72 hours for refills to be completed.    Today you received the following chemotherapy and/or immunotherapy agents Vidaza    To help prevent nausea and vomiting after your treatment, we encourage you to take your nausea medication as directed.  BELOW ARE SYMPTOMS THAT SHOULD BE REPORTED IMMEDIATELY: *FEVER GREATER THAN 100.4 F (38 C) OR HIGHER *CHILLS OR SWEATING *NAUSEA AND VOMITING THAT IS NOT CONTROLLED WITH YOUR NAUSEA MEDICATION *UNUSUAL SHORTNESS OF BREATH *UNUSUAL BRUISING OR BLEEDING *URINARY PROBLEMS (pain or burning when urinating, or frequent urination) *BOWEL PROBLEMS (unusual diarrhea, constipation, pain near the anus) TENDERNESS IN MOUTH AND THROAT WITH OR WITHOUT PRESENCE OF ULCERS (sore throat, sores in mouth, or a toothache) UNUSUAL RASH, SWELLING OR PAIN  UNUSUAL VAGINAL DISCHARGE OR ITCHING   Items with * indicate a potential emergency and should be followed up as soon as possible or go to the Emergency Department if any problems should occur.  Please show the CHEMOTHERAPY ALERT CARD or IMMUNOTHERAPY ALERT CARD at check-in to the Emergency Department and triage nurse. Should you have questions after your visit or need to cancel or reschedule your appointment, please contact [facility]  [phone] and follow the prompts.  Office hours are 8:00 a.m. to 4:30 p.m. Monday - Friday. Please note that voicemails left after 4:00 p.m. may not be returned until the following business day.  We are closed weekends and major holidays. You have access to a nurse at all times for urgent questions. Please call the main number to the clinic [phone] and follow the prompts.  For any non-urgent questions, you may also contact your provider using MyChart.  We now offer e-Visits for anyone 20 and older to request care online for non-urgent symptoms. For details visit [email or web address].   Also download the MyChart app! Go to the app store, search "MyChart", open the app, select [facility], and log in with your MyChart username and password.  Due to Covid, a mask is required upon entering the hospital/clinic. If you do not have a mask, one will be given to you upon arrival. For doctor visits, patients may have 1 support person aged 75 or older with them. For treatment visits, patients cannot have anyone with them due to current Covid guidelines and our immunocompromised population.

## 2021-07-18 ENCOUNTER — Inpatient Hospital Stay: Payer: PRIVATE HEALTH INSURANCE

## 2021-07-18 ENCOUNTER — Other Ambulatory Visit: Payer: Self-pay

## 2021-07-18 ENCOUNTER — Telehealth: Payer: Self-pay

## 2021-07-18 VITALS — BP 108/57 | HR 84 | Temp 97.7°F | Resp 16 | Wt 151.0 lb

## 2021-07-18 DIAGNOSIS — Z5111 Encounter for antineoplastic chemotherapy: Secondary | ICD-10-CM | POA: Diagnosis not present

## 2021-07-18 DIAGNOSIS — D46Z Other myelodysplastic syndromes: Secondary | ICD-10-CM

## 2021-07-18 MED ORDER — ONDANSETRON HCL 8 MG PO TABS: 8.0000 mg | ORAL_TABLET | Freq: Once | ORAL | Status: DC

## 2021-07-18 MED ORDER — AZACITIDINE CHEMO SQ INJECTION
75.0000 mg/m2 | Freq: Once | INTRAMUSCULAR | Status: AC
  Administered 2021-07-18: 142.5 mg via SUBCUTANEOUS
  Filled 2021-07-18: qty 5.7

## 2021-07-18 NOTE — Telephone Encounter (Signed)
[employer] has approved patient assistance for [employer]

## 2021-07-18 NOTE — Patient Instructions (Signed)
Azacitidine suspension for injection (subcutaneous use) What is this medication? AZACITIDINE (ay za SITE i deen) is a chemotherapy drug. This medicine reduces the growth of cancer cells and can suppress the immune system. It is used fortreating myelodysplastic syndrome or some types of leukemia. This medicine may be used for other purposes; ask your health care provider orpharmacist if you have questions. COMMON BRAND NAME(S): Vidaza What should I tell my care team before I take this medication? They need to know if you have any of these conditions: kidney disease liver disease liver tumors an unusual or allergic reaction to azacitidine, mannitol, other medicines, foods, dyes, or preservatives pregnant or trying to get pregnant breast-feeding How should I use this medication? This medicine is for injection under the skin. It is administered in a hospitalor clinic by a specially trained health care professional. Talk to your pediatrician regarding the use of this medicine in children. Whilethis drug may be prescribed for selected conditions, precautions do apply. Overdosage: If you think you have taken too much of this medicine contact apoison control center or emergency room at once. NOTE: This medicine is only for you. Do not share this medicine with others. What if I miss a dose? It is important not to miss your dose. Call your doctor or health careprofessional if you are unable to keep an appointment. What may interact with this medication? Interactions have not been studied. Give your health care provider a list of all the medicines, herbs, non-prescription drugs, or dietary supplements you use. Also tell them if you smoke, drink alcohol, or use illegal drugs. Some items may interact with yourmedicine. This list may not describe all possible interactions. Give your health care provider a list of all the medicines, herbs, non-prescription drugs, or dietary supplements you use. Also tell  them if you smoke, drink alcohol, or use illegaldrugs. Some items may interact with your medicine. What should I watch for while using this medication? Visit your doctor for checks on your progress. This drug may make you feel generally unwell. This is not uncommon, as chemotherapy can affect healthy cells as well as cancer cells. Report any side effects. Continue your course oftreatment even though you feel ill unless your doctor tells you to stop. In some cases, you may be given additional medicines to help with side effects.Follow all directions for their use. Call your doctor or health care professional for advice if you get a fever, chills or sore throat, or other symptoms of a cold or flu. Do not treat yourself. This drug decreases your body's ability to fight infections. Try toavoid being around people who are sick. This medicine may increase your risk to bruise or bleed. Call your doctor orhealth care professional if you notice any unusual bleeding. You may need blood work done while you are taking this medicine. Do not become pregnant while taking this medicine and for 6 months after the last dose. Women should inform their doctor if they wish to become pregnant or think they might be pregnant. Men should not father a child while taking this medicine and for 3 months after the last dose. There is a potential for serious side effects to an unborn child. Talk to your health care professional or pharmacist for more information. Do not breast-feed an infant while taking thismedicine and for 1 week after the last dose. This medicine may interfere with the ability to have a child. Talk with yourdoctor or health care professional if you are concerned about your fertility.   What side effects may I notice from receiving this medication? Side effects that you should report to your doctor or health care professionalas soon as possible: allergic reactions like skin rash, itching or hives, swelling of the  face, lips, or tongue low blood counts - this medicine may decrease the number of white blood cells, red blood cells and platelets. You may be at increased risk for infections and bleeding. signs of infection - fever or chills, cough, sore throat, pain passing urine signs of decreased platelets or bleeding - bruising, pinpoint red spots on the skin, black, tarry stools, blood in the urine signs of decreased red blood cells - unusually weak or tired, fainting spells, lightheadedness signs and symptoms of kidney injury like trouble passing urine or change in the amount of urine signs and symptoms of liver injury like dark yellow or brown urine; general ill feeling or flu-like symptoms; light-colored stools; loss of appetite; nausea; right upper belly pain; unusually weak or tired; yellowing of the eyes or skin Side effects that usually do not require medical attention (report to yourdoctor or health care professional if they continue or are bothersome): constipation diarrhea nausea, vomiting pain or redness at the injection site unusually weak or tired This list may not describe all possible side effects. Call your doctor for medical advice about side effects. You may report side effects to FDA at1-800-FDA-1088. Where should I keep my medication? This drug is given in a hospital or clinic and will not be stored at home. NOTE: This sheet is a summary. It may not cover all possible information. If you have questions about this medicine, talk to your doctor, pharmacist, orhealth care provider.  2022 Elsevier/Gold Standard (2016-12-15 14:37:51)  

## 2021-07-21 ENCOUNTER — Inpatient Hospital Stay: Payer: PRIVATE HEALTH INSURANCE

## 2021-07-22 ENCOUNTER — Inpatient Hospital Stay: Payer: PRIVATE HEALTH INSURANCE

## 2021-08-05 ENCOUNTER — Other Ambulatory Visit: Payer: Self-pay

## 2021-08-05 MED ORDER — AZACITIDINE CHEMO INJECTION 100 MG: INTRAMUSCULAR | 11 refills | Status: AC

## 2021-08-06 ENCOUNTER — Ambulatory Visit: Payer: Self-pay

## 2021-08-06 ENCOUNTER — Other Ambulatory Visit: Payer: Self-pay

## 2021-08-06 DIAGNOSIS — I5042 Chronic combined systolic (congestive) and diastolic (congestive) heart failure: Secondary | ICD-10-CM

## 2021-08-08 ENCOUNTER — Other Ambulatory Visit: Payer: Self-pay | Admitting: Hematology & Oncology

## 2021-08-11 ENCOUNTER — Encounter: Payer: Self-pay | Admitting: Family

## 2021-08-11 ENCOUNTER — Other Ambulatory Visit: Payer: Self-pay

## 2021-08-11 ENCOUNTER — Inpatient Hospital Stay: Payer: PRIVATE HEALTH INSURANCE

## 2021-08-11 ENCOUNTER — Inpatient Hospital Stay (HOSPITAL_BASED_OUTPATIENT_CLINIC_OR_DEPARTMENT_OTHER): Payer: PRIVATE HEALTH INSURANCE | Admitting: Family

## 2021-08-11 ENCOUNTER — Inpatient Hospital Stay: Payer: PRIVATE HEALTH INSURANCE | Attending: Family

## 2021-08-11 VITALS — BP 110/53 | HR 80 | Temp 98.6°F | Resp 18 | Ht 69.0 in | Wt 160.0 lb

## 2021-08-11 DIAGNOSIS — D46Z Other myelodysplastic syndromes: Secondary | ICD-10-CM

## 2021-08-11 DIAGNOSIS — Z5111 Encounter for antineoplastic chemotherapy: Secondary | ICD-10-CM | POA: Insufficient documentation

## 2021-08-11 DIAGNOSIS — D461 Refractory anemia with ring sideroblasts: Secondary | ICD-10-CM | POA: Diagnosis not present

## 2021-08-11 DIAGNOSIS — D696 Thrombocytopenia, unspecified: Secondary | ICD-10-CM

## 2021-08-11 DIAGNOSIS — D464 Refractory anemia, unspecified: Secondary | ICD-10-CM | POA: Diagnosis present

## 2021-08-11 DIAGNOSIS — D649 Anemia, unspecified: Secondary | ICD-10-CM | POA: Diagnosis not present

## 2021-08-11 LAB — CBC WITH DIFFERENTIAL (CANCER CENTER ONLY)
Abs Immature Granulocytes: 0.04 10*3/uL (ref 0.00–0.07)
Basophils Absolute: 0.1 10*3/uL (ref 0.0–0.1)
Basophils Relative: 1 %
Eosinophils Absolute: 0.3 10*3/uL (ref 0.0–0.5)
Eosinophils Relative: 5 %
HCT: 40.6 % (ref 39.0–52.0)
Hemoglobin: 13.9 g/dL (ref 13.0–17.0)
Immature Granulocytes: 1 %
Lymphocytes Relative: 38 %
Lymphs Abs: 2.5 10*3/uL (ref 0.7–4.0)
MCH: 35.5 pg — ABNORMAL HIGH (ref 26.0–34.0)
MCHC: 34.2 g/dL (ref 30.0–36.0)
MCV: 103.6 fL — ABNORMAL HIGH (ref 80.0–100.0)
Monocytes Absolute: 0.7 10*3/uL (ref 0.1–1.0)
Monocytes Relative: 10 %
Neutro Abs: 2.9 10*3/uL (ref 1.7–7.7)
Neutrophils Relative %: 45 %
Platelet Count: 93 10*3/uL — ABNORMAL LOW (ref 150–400)
RBC: 3.92 MIL/uL — ABNORMAL LOW (ref 4.22–5.81)
RDW: 14.8 % (ref 11.5–15.5)
WBC Count: 6.4 10*3/uL (ref 4.0–10.5)
nRBC: 0.3 % — ABNORMAL HIGH (ref 0.0–0.2)

## 2021-08-11 LAB — CMP (CANCER CENTER ONLY)
ALT: 18 U/L (ref 0–44)
AST: 17 U/L (ref 15–41)
Albumin: 4.1 g/dL (ref 3.5–5.0)
Alkaline Phosphatase: 119 U/L (ref 38–126)
Anion gap: 6 (ref 5–15)
BUN: 21 mg/dL — ABNORMAL HIGH (ref 6–20)
CO2: 27 mmol/L (ref 22–32)
Calcium: 9.9 mg/dL (ref 8.9–10.3)
Chloride: 103 mmol/L (ref 98–111)
Creatinine: 0.78 mg/dL (ref 0.61–1.24)
GFR, Estimated: >60 mL/min (ref >60)
Glucose, Bld: 98 mg/dL (ref 70–99)
Potassium: 4.3 mmol/L (ref 3.5–5.1)
Sodium: 136 mmol/L (ref 135–145)
Total Bilirubin: 0.3 mg/dL (ref 0.3–1.2)
Total Protein: 7.7 g/dL (ref 6.5–8.1)

## 2021-08-11 LAB — IRON AND TIBC
Iron: 147 ug/dL (ref 42–163)
Saturation Ratios: 64 % — ABNORMAL HIGH (ref 20–55)
TIBC: 230 ug/dL (ref 202–409)
UIBC: 83 ug/dL — ABNORMAL LOW (ref 117–376)

## 2021-08-11 LAB — FERRITIN: Ferritin: 938 ng/mL — ABNORMAL HIGH (ref 24–336)

## 2021-08-11 LAB — RETICULOCYTES
Immature Retic Fract: 14.1 % (ref 2.3–15.9)
RBC.: 3.89 MIL/uL — ABNORMAL LOW (ref 4.22–5.81)
Retic Count, Absolute: 77.8 10*3/uL (ref 19.0–186.0)
Retic Ct Pct: 2 % (ref 0.4–3.1)

## 2021-08-11 LAB — LACTATE DEHYDROGENASE: LDH: 177 U/L (ref 98–192)

## 2021-08-11 LAB — SAVE SMEAR(SSMR), FOR PROVIDER SLIDE REVIEW

## 2021-08-11 MED ORDER — AZACITIDINE CHEMO SQ INJECTION
75.0000 mg/m2 | Freq: Once | INTRAMUSCULAR | Status: AC
  Administered 2021-08-11: 142.5 mg via SUBCUTANEOUS
  Filled 2021-08-11: qty 5.7

## 2021-08-11 MED ORDER — ONDANSETRON HCL 8 MG PO TABS: 8.0000 mg | ORAL_TABLET | Freq: Once | ORAL | Status: DC

## 2021-08-11 NOTE — Progress Notes (Signed)
Hematology and Oncology Follow Up Visit  Thomas Frazier [id number] 18-Aug-1966 55 y.o. 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 He is doing well and has no complaints at this time. He states that he met with Dr. Mariea Clonts at [facility] last week and they hope to move forward with transplant around January 2023.  His counts today are stable. Hgb 13.9,MCV 103, WBC count and platelets 93. He denies fatigue.  He has had no issues with bleeding. No abnormal bruising, no petechiae.  No fever, chills, n/v, cough, rash, dizziness, SOB, chest pain, palpitations, abdominal pain or changes in bowel or bladder habits. No swelling, tenderness, numbness or tingling in his extremities. No falls or syncope to report.  He is eating well and drinking Ensure daily. He does feel that he is staying well hydrated throughout the day. His weight is much improved at 160 lbs.   ECOG Performance Status: 0 - Asymptomatic  Medications:  Allergies as of 08/11/2021       Reactions   Ciprofloxacin Rash   Diflucan [fluconazole] Rash   Famvir [famciclovir] Rash        Medication List        Accurate as of August 11, 2021  9:56 AM. If you have any questions, ask your nurse or doctor.          azaCITIDine 100 MG Susr Commonly known as: VIDAZA Vidaza 142.5 mg (rounded from 143.25 mg)=75 mg/m2 x 1.91 m2 daily times five days every twenty eight days   carvedilol 3.125 MG tablet Commonly known as: COREG Take 1 tablet (3.125 mg total) by mouth 2 (two) times daily with a meal.   digoxin 0.25 MG tablet Commonly known as: LANOXIN Take 1 tablet (0.25 mg  total) by mouth daily.   magic mouthwash w/lidocaine Soln Take 5 mLs by mouth 3 (three) times daily as needed for mouth pain.   MULTIVITAMIN/IRON PO Take 1 tablet by mouth daily.   prochlorperazine 10 MG tablet Commonly known as: COMPAZINE Take 1 tablet (10 mg total) by mouth every 6 (six) hours as needed (Nausea or vomiting). What changed: reasons to take this   sacubitril-valsartan 24-26 MG Commonly known as: ENTRESTO Take 0.5 tablets by mouth 2 (two) times daily.   traMADol 50 MG tablet Commonly known as: ULTRAM Take 2 tablets (100 mg total) by mouth every 6 (six) hours as needed. 1-2 tablets (36m- 1064m by mouth every 6 hours as needed        Allergies:  Allergies  Allergen Reactions   Ciprofloxacin Rash   Diflucan [Fluconazole] Rash   Famvir [Famciclovir] Rash    Past Medical History, Surgical history, Social history, and Family History were reviewed and updated.  Review of Systems: All other 10 point review of systems is negative.   Physical Exam:  height is 5' 9"  (1.753 m) and weight is 160 lb 0.6 oz (72.6 kg). His oral temperature is 98.6 F (37 C). His blood pressure is 110/53 (abnormal) and his pulse is 80. His respiration is 18 and oxygen saturation is 99%.   Wt Readings from Last 3 Encounters:  08/11/21 160 lb  0.6 oz (72.6 kg)  07/18/21 151 lb (68.5 kg)  07/14/21 156 lb (70.8 kg)    Ocular: Sclerae unicteric, pupils equal, round and reactive to light Ear-nose-throat: Oropharynx clear, dentition fair Lymphatic: No cervical or supraclavicular adenopathy Lungs no rales or rhonchi, good excursion bilaterally Heart regular rate and rhythm, no murmur appreciated Abd soft, nontender, positive bowel sounds MSK no focal spinal tenderness, no joint edema Neuro: non-focal, well-oriented, appropriate affect Breasts: Deferred   Lab Results  Component Value Date   WBC 6.4 08/11/2021   HGB 13.9 08/11/2021   HCT 40.6 08/11/2021   MCV 103.6 (H) 08/11/2021    PLT 93 (L) 08/11/2021   Lab Results  Component Value Date   FERRITIN 764 (H) 07/14/2021   IRON 63 07/14/2021   TIBC 224 07/14/2021   UIBC 161 07/14/2021   IRONPCTSAT 28 07/14/2021   Lab Results  Component Value Date   RETICCTPCT 2.0 08/11/2021   RBC 3.89 (L) 08/11/2021   RBC 3.92 (L) 08/11/2021   No results found for: KPAFRELGTCHN, LAMBDASER, KAPLAMBRATIO No results found for: IGGSERUM, IGA, IGMSERUM No results found for: Ronnald Ramp, A1GS, A2GS, Tillman Sers, SPEI   Chemistry      Component Value Date/Time   NA 139 07/14/2021 0823   K 4.4 07/14/2021 0823   CL 103 07/14/2021 0823   CO2 30 07/14/2021 0823   BUN 15 07/14/2021 0823   CREATININE 0.76 07/14/2021 0823      Component Value Date/Time   CALCIUM 10.2 07/14/2021 0823   ALKPHOS 135 (H) 07/14/2021 0823   AST 14 (L) 07/14/2021 0823   ALT 16 07/14/2021 0823   BILITOT 0.5 07/14/2021 0823       Impression and Plan: Thomas Frazier is a very pleasant 55 yo Hispanic gentleman with high grade myelodysplasia, complex cytogenetics.  We will proceed with cycle 6 of treatment.  Hopefully he will be able to start the transplant process in January with Dr.Reed.  Follow-up in 4 weeks.  He can contact our office with any questions or concerns.   Lottie [facility], NP 9/12/20229:56 AM

## 2021-08-11 NOTE — Progress Notes (Signed)
Cbc and cmet reviewed by Judson Roch, NP. Ok to treat despite counts

## 2021-08-12 ENCOUNTER — Telehealth: Payer: Self-pay | Admitting: *Deleted

## 2021-08-12 ENCOUNTER — Inpatient Hospital Stay: Payer: PRIVATE HEALTH INSURANCE

## 2021-08-12 VITALS — BP 112/57 | HR 85 | Temp 97.9°F | Resp 20

## 2021-08-12 DIAGNOSIS — Z5111 Encounter for antineoplastic chemotherapy: Secondary | ICD-10-CM | POA: Diagnosis not present

## 2021-08-12 DIAGNOSIS — D46Z Other myelodysplastic syndromes: Secondary | ICD-10-CM

## 2021-08-12 MED ORDER — AZACITIDINE CHEMO SQ INJECTION
75.0000 mg/m2 | Freq: Once | INTRAMUSCULAR | Status: AC
  Administered 2021-08-12: 142.5 mg via SUBCUTANEOUS
  Filled 2021-08-12: qty 5.7

## 2021-08-12 MED ORDER — ONDANSETRON HCL 8 MG PO TABS: 8.0000 mg | ORAL_TABLET | Freq: Once | ORAL | Status: DC

## 2021-08-12 NOTE — Telephone Encounter (Signed)
Per 08/11/21 los - gave upcoming appointments - print calendar

## 2021-08-13 ENCOUNTER — Other Ambulatory Visit: Payer: Self-pay

## 2021-08-13 ENCOUNTER — Inpatient Hospital Stay: Payer: PRIVATE HEALTH INSURANCE

## 2021-08-13 VITALS — BP 113/61 | HR 69 | Temp 98.2°F | Resp 17

## 2021-08-13 DIAGNOSIS — Z5111 Encounter for antineoplastic chemotherapy: Secondary | ICD-10-CM | POA: Diagnosis not present

## 2021-08-13 DIAGNOSIS — D46Z Other myelodysplastic syndromes: Secondary | ICD-10-CM

## 2021-08-13 MED ORDER — AZACITIDINE CHEMO SQ INJECTION
75.0000 mg/m2 | Freq: Once | INTRAMUSCULAR | Status: AC
  Administered 2021-08-13: 142.5 mg via SUBCUTANEOUS
  Filled 2021-08-13: qty 5.7

## 2021-08-13 NOTE — Patient Instructions (Signed)
[facility] [place]  Discharge Instructions: Thank you for choosing [facility] to provide your oncology and hematology care.   If you have a lab appointment with the [facility], please go directly to the [facility] and check in at the registration area.  Wear comfortable clothing and clothing appropriate for easy access to any Portacath or PICC line.   We strive to give you quality time with your provider. You may need to reschedule your appointment if you arrive late (15 or more minutes).  Arriving late affects you and other patients whose appointments are after yours.  Also, if you miss three or more appointments without notifying the office, you may be dismissed from the clinic at the provider's discretion.      For prescription refill requests, have your pharmacy contact our office and allow 72 hours for refills to be completed.    Today you received the following chemotherapy and/or immunotherapy agents vidaza   To help prevent nausea and vomiting after your treatment, we encourage you to take your nausea medication as directed.  BELOW ARE SYMPTOMS THAT SHOULD BE REPORTED IMMEDIATELY: *FEVER GREATER THAN 100.4 F (38 C) OR HIGHER *CHILLS OR SWEATING *NAUSEA AND VOMITING THAT IS NOT CONTROLLED WITH YOUR NAUSEA MEDICATION *UNUSUAL SHORTNESS OF BREATH *UNUSUAL BRUISING OR BLEEDING *URINARY PROBLEMS (pain or burning when urinating, or frequent urination) *BOWEL PROBLEMS (unusual diarrhea, constipation, pain near the anus) TENDERNESS IN MOUTH AND THROAT WITH OR WITHOUT PRESENCE OF ULCERS (sore throat, sores in mouth, or a toothache) UNUSUAL RASH, SWELLING OR PAIN  UNUSUAL VAGINAL DISCHARGE OR ITCHING   Items with * indicate a potential emergency and should be followed up as soon as possible or go to the Emergency Department if any problems should occur.  Please show the CHEMOTHERAPY ALERT CARD or IMMUNOTHERAPY ALERT CARD at check-in to the  Emergency Department and triage nurse. Should you have questions after your visit or need to cancel or reschedule your appointment, please contact [facility]  [phone] and follow the prompts.  Office hours are 8:00 a.m. to 4:30 p.m. Monday - Friday. Please note that voicemails left after 4:00 p.m. may not be returned until the following business day.  We are closed weekends and major holidays. You have access to a nurse at all times for urgent questions. Please call the main number to the clinic [phone] and follow the prompts.  For any non-urgent questions, you may also contact your provider using MyChart. We now offer e-Visits for anyone 67 and older to request care online for non-urgent symptoms. For details visit [email or web address].   Also download the MyChart app! Go to the app store, search "MyChart", open the app, select , and log in with your MyChart username and password.  Due to Covid, a mask is required upon entering the hospital/clinic. If you do not have a mask, one will be given to you upon arrival. For doctor visits, patients may have 1 support person aged 90 or older with them. For treatment visits, patients cannot have anyone with them due to current Covid guidelines and our immunocompromised population.

## 2021-08-14 ENCOUNTER — Inpatient Hospital Stay: Payer: PRIVATE HEALTH INSURANCE

## 2021-08-14 VITALS — BP 107/51 | HR 75 | Temp 97.8°F | Resp 18

## 2021-08-14 DIAGNOSIS — Z5111 Encounter for antineoplastic chemotherapy: Secondary | ICD-10-CM | POA: Diagnosis not present

## 2021-08-14 DIAGNOSIS — D46Z Other myelodysplastic syndromes: Secondary | ICD-10-CM

## 2021-08-14 MED ORDER — AZACITIDINE CHEMO SQ INJECTION
75.0000 mg/m2 | Freq: Once | INTRAMUSCULAR | Status: AC
  Administered 2021-08-14: 142.5 mg via SUBCUTANEOUS
  Filled 2021-08-14: qty 5.7

## 2021-08-14 MED ORDER — ONDANSETRON HCL 8 MG PO TABS: 8.0000 mg | ORAL_TABLET | Freq: Once | ORAL | Status: DC

## 2021-08-14 NOTE — Patient Instructions (Signed)
[facility] [place]  Discharge Instructions: Thank you for choosing [facility] to provide your oncology and hematology care.   If you have a lab appointment with the [facility], please go directly to the [facility] and check in at the registration area.  Wear comfortable clothing and clothing appropriate for easy access to any Portacath or PICC line.   We strive to give you quality time with your provider. You may need to reschedule your appointment if you arrive late (15 or more minutes).  Arriving late affects you and other patients whose appointments are after yours.  Also, if you miss three or more appointments without notifying the office, you may be dismissed from the clinic at the provider's discretion.      For prescription refill requests, have your pharmacy contact our office and allow 72 hours for refills to be completed.    Today you received the following chemotherapy and/or immunotherapy agents Vidaza.      To help prevent nausea and vomiting after your treatment, we encourage you to take your nausea medication as directed.  BELOW ARE SYMPTOMS THAT SHOULD BE REPORTED IMMEDIATELY: *FEVER GREATER THAN 100.4 F (38 C) OR HIGHER *CHILLS OR SWEATING *NAUSEA AND VOMITING THAT IS NOT CONTROLLED WITH YOUR NAUSEA MEDICATION *UNUSUAL SHORTNESS OF BREATH *UNUSUAL BRUISING OR BLEEDING *URINARY PROBLEMS (pain or burning when urinating, or frequent urination) *BOWEL PROBLEMS (unusual diarrhea, constipation, pain near the anus) TENDERNESS IN MOUTH AND THROAT WITH OR WITHOUT PRESENCE OF ULCERS (sore throat, sores in mouth, or a toothache) UNUSUAL RASH, SWELLING OR PAIN  UNUSUAL VAGINAL DISCHARGE OR ITCHING   Items with * indicate a potential emergency and should be followed up as soon as possible or go to the Emergency Department if any problems should occur.  Please show the CHEMOTHERAPY ALERT CARD or IMMUNOTHERAPY ALERT CARD at check-in to the  Emergency Department and triage nurse. Should you have questions after your visit or need to cancel or reschedule your appointment, please contact [facility]  [phone] and follow the prompts.  Office hours are 8:00 a.m. to 4:30 p.m. Monday - Friday. Please note that voicemails left after 4:00 p.m. may not be returned until the following business day.  We are closed weekends and major holidays. You have access to a nurse at all times for urgent questions. Please call the main number to the clinic [phone] and follow the prompts.  For any non-urgent questions, you may also contact your provider using MyChart. We now offer e-Visits for anyone 45 and older to request care online for non-urgent symptoms. For details visit [email or web address].   Also download the MyChart app! Go to the app store, search "MyChart", open the app, select [facility], and log in with your MyChart username and password.  Due to Covid, a mask is required upon entering the hospital/clinic. If you do not have a mask, one will be given to you upon arrival. For doctor visits, patients may have 1 support person aged 22 or older with them. For treatment visits, patients cannot have anyone with them due to current Covid guidelines and our immunocompromised population.

## 2021-08-15 ENCOUNTER — Other Ambulatory Visit: Payer: Self-pay

## 2021-08-15 ENCOUNTER — Inpatient Hospital Stay: Payer: PRIVATE HEALTH INSURANCE

## 2021-08-15 VITALS — BP 106/57 | Temp 97.7°F | Resp 18

## 2021-08-15 DIAGNOSIS — D46Z Other myelodysplastic syndromes: Secondary | ICD-10-CM

## 2021-08-15 DIAGNOSIS — Z5111 Encounter for antineoplastic chemotherapy: Secondary | ICD-10-CM | POA: Diagnosis not present

## 2021-08-15 MED ORDER — ONDANSETRON HCL 8 MG PO TABS: 8.0000 mg | ORAL_TABLET | Freq: Once | ORAL | Status: DC

## 2021-08-15 MED ORDER — AZACITIDINE CHEMO SQ INJECTION
75.0000 mg/m2 | Freq: Once | INTRAMUSCULAR | Status: AC
  Administered 2021-08-15: 142.5 mg via SUBCUTANEOUS
  Filled 2021-08-15: qty 5.7

## 2021-08-18 ENCOUNTER — Encounter: Payer: Self-pay | Admitting: *Deleted

## 2021-09-08 ENCOUNTER — Inpatient Hospital Stay: Payer: PRIVATE HEALTH INSURANCE | Attending: Family

## 2021-09-08 ENCOUNTER — Other Ambulatory Visit: Payer: Self-pay

## 2021-09-08 ENCOUNTER — Inpatient Hospital Stay: Payer: PRIVATE HEALTH INSURANCE

## 2021-09-08 VITALS — HR 63 | Temp 98.2°F

## 2021-09-08 DIAGNOSIS — D464 Refractory anemia, unspecified: Secondary | ICD-10-CM | POA: Diagnosis present

## 2021-09-08 DIAGNOSIS — Z5111 Encounter for antineoplastic chemotherapy: Secondary | ICD-10-CM | POA: Diagnosis not present

## 2021-09-08 DIAGNOSIS — D46Z Other myelodysplastic syndromes: Secondary | ICD-10-CM

## 2021-09-08 LAB — CBC WITH DIFFERENTIAL (CANCER CENTER ONLY)
Abs Immature Granulocytes: 0 10*3/uL (ref 0.00–0.07)
Basophils Absolute: 0 10*3/uL (ref 0.0–0.1)
Basophils Relative: 1 %
Eosinophils Absolute: 0.1 10*3/uL (ref 0.0–0.5)
Eosinophils Relative: 2 %
HCT: 36 % — ABNORMAL LOW (ref 39.0–52.0)
Hemoglobin: 12.5 g/dL — ABNORMAL LOW (ref 13.0–17.0)
Immature Granulocytes: 0 %
Lymphocytes Relative: 67 %
Lymphs Abs: 2.9 10*3/uL (ref 0.7–4.0)
MCH: 35.7 pg — ABNORMAL HIGH (ref 26.0–34.0)
MCHC: 34.7 g/dL (ref 30.0–36.0)
MCV: 102.9 fL — ABNORMAL HIGH (ref 80.0–100.0)
Monocytes Absolute: 0.2 10*3/uL (ref 0.1–1.0)
Monocytes Relative: 6 %
Neutro Abs: 1.1 10*3/uL — ABNORMAL LOW (ref 1.7–7.7)
Neutrophils Relative %: 24 %
Platelet Count: 51 10*3/uL — ABNORMAL LOW (ref 150–400)
RBC: 3.5 MIL/uL — ABNORMAL LOW (ref 4.22–5.81)
RDW: 13.5 % (ref 11.5–15.5)
WBC Count: 4.3 10*3/uL (ref 4.0–10.5)
nRBC: 0.9 % — ABNORMAL HIGH (ref 0.0–0.2)

## 2021-09-08 LAB — BASIC METABOLIC PANEL - CANCER CENTER ONLY
Anion gap: 7 (ref 5–15)
BUN: 21 mg/dL — ABNORMAL HIGH (ref 6–20)
CO2: 26 mmol/L (ref 22–32)
Calcium: 9.9 mg/dL (ref 8.9–10.3)
Chloride: 104 mmol/L (ref 98–111)
Creatinine: 0.88 mg/dL (ref 0.61–1.24)
GFR, Estimated: >60 mL/min (ref >60)
Glucose, Bld: 91 mg/dL (ref 70–99)
Potassium: 4.7 mmol/L (ref 3.5–5.1)
Sodium: 137 mmol/L (ref 135–145)

## 2021-09-08 MED ORDER — ONDANSETRON HCL 8 MG PO TABS: 8.0000 mg | ORAL_TABLET | Freq: Once | ORAL | Status: DC

## 2021-09-08 MED ORDER — AZACITIDINE CHEMO SQ INJECTION
75.0000 mg/m2 | Freq: Once | INTRAMUSCULAR | Status: AC
  Administered 2021-09-08: 142.5 mg via SUBCUTANEOUS
  Filled 2021-09-08: qty 5.7

## 2021-09-08 NOTE — Progress Notes (Signed)
MD, reviewed cbc and Bmet, ok to treat despite counts

## 2021-09-08 NOTE — Patient Instructions (Signed)
Azacitidine suspension for injection (subcutaneous use) Qu es [employer]? La AZACITIDINA es un agente quimioteraputico. Este medicamento reduce el crecimiento de clulas cancerosas y puede suprimir el sistema inmunolgico. Se utiliza tambin para el tratamiento de sndrome mielodisplsticos o algunos tipos de leucemia. Este medicamento puede ser utilizado para otros usos; si tiene alguna pregunta consulte con su proveedor de atencin mdica o con su farmacutico. Este medicamento puede ser utilizado para otros usos; si tiene alguna pregunta consulte con su proveedor de atencin mdica o con su farmacutico. MARCAS COMUNES: Vidaza Qu le debo informar a mi profesional de la salud antes de tomar este medicamento? Necesitan saber si usted presenta alguno de los siguientes problemas o situaciones: -enfermedad renal enfermedad heptica tumores hepticos una reaccin alrgica o inusual a la azacitidina, al manitol, a otros medicamentos, alimentos, colorantes o conservantes si est embarazada o buscando quedar embarazada si est amamantando a un beb Cmo debo utilizar este medicamento? Este medicamento se administra mediante inyeccin por va subcutnea. Lo administra un profesional de la salud calificado en un hospital o en un entorno clnico. Hable con su pediatra para informarse acerca del uso de este medicamento en nios. Puede requerir atencin especial. Sobredosis: Pngase en contacto inmediatamente con un centro toxicolgico o una sala de urgencia si usted cree que haya tomado demasiado medicamento. ATENCIN: [employer] es solo para usted. No comparta este medicamento con nadie. Sobredosis: Pngase en contacto inmediatamente con un centro toxicolgico o una sala de urgencia si usted cree que haya tomado demasiado medicamento. ATENCIN: [employer] es solo para usted. No comparta este medicamento con nadie. Qu sucede si me olvido de una dosis? Es importante no olvidar  ninguna dosis. Informe a su mdico o a su profesional de la salud si no puede asistir a Photographer. Qu puede interactuar con este medicamento? No se han estudiado las interacciones. Suministre a Conservation officer, historic buildings de atencin mdica una lista de todos los [place], hierbas, medicamentos de [place], o suplementos dietticos que [place]. Dgales tambin si fuma, bebe alcohol, o utiliza drogas ilegales. Algunos elementos pueden interactuar con su medicamento. Puede ser que esta lista no menciona todas las posibles interacciones. Informe a su profesional de KB Home	[place] de [employer] productos a base de hierbas, medicamentos de [place] o suplementos nutritivos que est tomando. Si usted fuma, consume bebidas alcohlicas o si utiliza drogas ilegales, indqueselo tambin a su profesional de KB Home	[place]. Algunas sustancias pueden interactuar con su medicamento. Puede ser que esta lista no menciona todas las posibles interacciones. Informe a su profesional de KB Home	[place] de [employer] productos a base de hierbas, medicamentos de [place] o suplementos nutritivos que est tomando. Si usted fuma, consume bebidas alcohlicas o si utiliza drogas ilegales, indqueselo tambin a su profesional de KB Home	[place]. Algunas sustancias pueden interactuar con su medicamento. A qu debo estar atento al usar Coca[employer]? Visite a su mdico para que revise su evolucin. Este medicamento podra hacerle sentir un Nurse, mental health. Esto es normal ya que la quimioterapia puede Print production planner tanto a las clulas sanas como a las clulas cancerosas. Si presenta algn efecto secundario, infrmelo. Contine con el tratamiento aun si se siente enfermo, a menos que su mdico le indique que lo suspenda. En algunos casos, podra recibir [employer] para ayudarlo con los efectos secundarios. Siga todas las instrucciones para usarlos. Consulte a su mdico o a su profesional de la salud si tiene fiebre, escalofros o dolor de garganta, o  cualquier otro sntoma de resfro o gripe.  No se trate usted mismo. Este medicamento reduce la capacidad del cuerpo para combatir infecciones. Trate de no acercarse a personas que estn enfermas. Este medicamento podra aumentar el riesgo de moretones o sangrado. Consulte a su mdico o a su profesional de la salud si observa sangrados inusuales. Usted podra necesitar realizarse [employer] de sangre mientras est usando [place]. No debe quedar embarazada mientras est tomando este medicamento y por 6 meses despus de la ltima dosis. Las mujeres deben informar a su mdico si estn buscando quedar embarazadas o si creen que podran estar embarazadas. Los hombres no deben tener hijos mientras estn recibiendo [employer] y durante 3 meses despus de la ltima dosis. Existe la posibilidad de efectos secundarios graves en un beb sin nacer. Para obtener ms informacin, hable con su profesional de la salud o su farmacutico. No debe amamantar a un beb mientras est usando este medicamento y por 1 semana despus de la ltima dosis. Este medicamento puede interferir con la capacidad de tener hijos. Hable con su mdico o su profesional de la salud si est preocupado por su fertilidad. Qu efectos secundarios puedo tener al [employer] este medicamento? Efectos secundarios que debe informar a su mdico o a Barrister's clerk de la salud tan pronto como sea posible: Chief of Staff, como erupcin cutnea, comezn/picazn o urticarias, e hinchazn de la cara, los labios o la lengua recuentos sanguneos bajos: este medicamento podra reducir la cantidad de glbulos blancos, glbulos rojos y plaquetas. Su riesgo de infeccin y sangrado podra ser mayor. signos de infeccin: fiebre o escalofros, tos, dolor de garganta, dolor al orinar signos de disminucin en la cantidad de plaquetas o sangrado: moretones, puntos rojos en la piel, heces de color negro y aspecto alquitranado, sangre en la orina signos de  disminucin en la cantidad de glbulos rojos: cansancio o debilidad inusual, Youth worker, aturdimiento signos y sntomas de lesin al rin, tales como dificultad para orinar o cambios en la cantidad de orina signos y sntomas de lesin al hgado, como orina amarilla oscura o [place]; sensacin general de estar enfermo o sntomas gripales; heces claras; prdida de apetito; nuseas; dolor en la regin abdominal superior derecha; cansancio o debilidad inusual; color amarillento de los ojos o la piel Efectos secundarios que generalmente no requieren atencin mdica (infrmelos a su mdico o a su profesional de la salud si persisten o si son molestos): estreimiento diarrea nuseas, vmito dolor o enrojecimiento en TEFL teacher de la inyeccin cansancio o debilidad inusual Puede ser que esta lista no menciona todos los posibles efectos secundarios. Comunquese a su mdico por asesoramiento mdico [employer] Usted puede informar los efectos secundarios a la FDA por telfono al 1-800-FDA-1088. Puede ser que esta lista no menciona todos los posibles efectos secundarios. Comunquese a su mdico por asesoramiento mdico [employer] Usted puede informar los efectos secundarios a la FDA por telfono al 1-800-FDA-1088. Dnde debo guardar mi medicina? Este medicamento se administra en hospitales o clnicas y no necesitar guardarlo en su domicilio.ATENCIN:Este folleto es un resumen. Puede ser que no cubra toda la posible informacin. Si usted tiene preguntas acerca de esta medicina, consulte con su mdico, su farmacutico o su profesional de Technical sales engineer. ATENCIN: Este folleto es un resumen. Puede ser que no cubra toda la posible informacin. Si usted tiene preguntas acerca de esta medicina, consulte con su mdico, su farmacutico o su profesional de Technical sales engineer.  2022 Elsevier/Gold Standard (2017-01-28 00:00:00)

## 2021-09-09 ENCOUNTER — Inpatient Hospital Stay: Payer: PRIVATE HEALTH INSURANCE

## 2021-09-09 ENCOUNTER — Encounter: Payer: Self-pay | Admitting: Hematology & Oncology

## 2021-09-09 ENCOUNTER — Other Ambulatory Visit: Payer: Self-pay

## 2021-09-09 ENCOUNTER — Inpatient Hospital Stay (HOSPITAL_BASED_OUTPATIENT_CLINIC_OR_DEPARTMENT_OTHER): Payer: PRIVATE HEALTH INSURANCE | Admitting: Hematology & Oncology

## 2021-09-09 VITALS — BP 130/59 | HR 68 | Temp 98.1°F | Resp 18 | Wt 162.5 lb

## 2021-09-09 DIAGNOSIS — D46Z Other myelodysplastic syndromes: Secondary | ICD-10-CM

## 2021-09-09 DIAGNOSIS — Z5111 Encounter for antineoplastic chemotherapy: Secondary | ICD-10-CM | POA: Diagnosis not present

## 2021-09-09 MED ORDER — ONDANSETRON HCL 8 MG PO TABS: 8.0000 mg | ORAL_TABLET | Freq: Once | ORAL | Status: DC

## 2021-09-09 MED ORDER — AZACITIDINE CHEMO SQ INJECTION
75.0000 mg/m2 | Freq: Once | INTRAMUSCULAR | Status: AC
  Administered 2021-09-09: 142.5 mg via SUBCUTANEOUS
  Filled 2021-09-09: qty 5.7

## 2021-09-09 NOTE — Patient Instructions (Signed)
Azacitidine suspension for injection (subcutaneous use) What is this medication? AZACITIDINE (ay za SITE i deen) is a chemotherapy drug. This medicine reduces the growth of cancer cells and can suppress the immune system. It is used fortreating myelodysplastic syndrome or some types of leukemia. This medicine may be used for other purposes; ask your health care provider orpharmacist if you have questions. COMMON BRAND NAME(S): Vidaza What should I tell my care team before I take this medication? They need to know if you have any of these conditions: kidney disease liver disease liver tumors an unusual or allergic reaction to azacitidine, mannitol, other medicines, foods, dyes, or preservatives pregnant or trying to get pregnant breast-feeding How should I use this medication? This medicine is for injection under the skin. It is administered in a hospitalor clinic by a specially trained health care professional. Talk to your pediatrician regarding the use of this medicine in children. Whilethis drug may be prescribed for selected conditions, precautions do apply. Overdosage: If you think you have taken too much of this medicine contact apoison control center or emergency room at once. NOTE: This medicine is only for you. Do not share this medicine with others. What if I miss a dose? It is important not to miss your dose. Call your doctor or health careprofessional if you are unable to keep an appointment. What may interact with this medication? Interactions have not been studied. Give your health care provider a list of all the medicines, herbs, non-prescription drugs, or dietary supplements you use. Also tell them if you smoke, drink alcohol, or use illegal drugs. Some items may interact with yourmedicine. This list may not describe all possible interactions. Give your health care provider a list of all the medicines, herbs, non-prescription drugs, or dietary supplements you use. Also tell  them if you smoke, drink alcohol, or use illegaldrugs. Some items may interact with your medicine. What should I watch for while using this medication? Visit your doctor for checks on your progress. This drug may make you feel generally unwell. This is not uncommon, as chemotherapy can affect healthy cells as well as cancer cells. Report any side effects. Continue your course oftreatment even though you feel ill unless your doctor tells you to stop. In some cases, you may be given additional medicines to help with side effects.Follow all directions for their use. Call your doctor or health care professional for advice if you get a fever, chills or sore throat, or other symptoms of a cold or flu. Do not treat yourself. This drug decreases your body's ability to fight infections. Try toavoid being around people who are sick. This medicine may increase your risk to bruise or bleed. Call your doctor orhealth care professional if you notice any unusual bleeding. You may need blood work done while you are taking this medicine. Do not become pregnant while taking this medicine and for 6 months after the last dose. Women should inform their doctor if they wish to become pregnant or think they might be pregnant. Men should not father a child while taking this medicine and for 3 months after the last dose. There is a potential for serious side effects to an unborn child. Talk to your health care professional or pharmacist for more information. Do not breast-feed an infant while taking thismedicine and for 1 week after the last dose. This medicine may interfere with the ability to have a child. Talk with yourdoctor or health care professional if you are concerned about your fertility.   What side effects may I notice from receiving this medication? Side effects that you should report to your doctor or health care professionalas soon as possible: allergic reactions like skin rash, itching or hives, swelling of the  face, lips, or tongue low blood counts - this medicine may decrease the number of white blood cells, red blood cells and platelets. You may be at increased risk for infections and bleeding. signs of infection - fever or chills, cough, sore throat, pain passing urine signs of decreased platelets or bleeding - bruising, pinpoint red spots on the skin, black, tarry stools, blood in the urine signs of decreased red blood cells - unusually weak or tired, fainting spells, lightheadedness signs and symptoms of kidney injury like trouble passing urine or change in the amount of urine signs and symptoms of liver injury like dark yellow or brown urine; general ill feeling or flu-like symptoms; light-colored stools; loss of appetite; nausea; right upper belly pain; unusually weak or tired; yellowing of the eyes or skin Side effects that usually do not require medical attention (report to yourdoctor or health care professional if they continue or are bothersome): constipation diarrhea nausea, vomiting pain or redness at the injection site unusually weak or tired This list may not describe all possible side effects. Call your doctor for medical advice about side effects. You may report side effects to FDA at1-800-FDA-1088. Where should I keep my medication? This drug is given in a hospital or clinic and will not be stored at home. NOTE: This sheet is a summary. It may not cover all possible information. If you have questions about this medicine, talk to your doctor, pharmacist, orhealth care provider.  2022 Elsevier/Gold Standard (2016-12-15 14:37:51)  

## 2021-09-09 NOTE — Progress Notes (Signed)
Hematology and Oncology Follow Up Visit  Thomas Frazier [id number] 1966-04-26 55 y.o. 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  He has a myriad of complaints.  He says he just feels more tired.  He says he has more bony aches and pains.  He says that he is having some difficulty with his bowels.  He is having occasional sweats.  I noted that his platelet count is been slowly dropping.  This is a little bit troublesome.  I suppose it could be from his treatments.  He did have a bone marrow biopsy done at [facility] back in early September.  There is still some persistent changes of MDS.  He had 1-2% blasts.  There is no evidence of the TP 53 mutation.  He really is going to need a stem cell transplant.  The question is whether he is going to have this in the [place] or in [place].  He was told that he may have to go to [place] for the transplant because of insurance issues.  He would like to have this in the [place] if possible.  His appetite actually seems to be doing pretty well.  He has had no issues with nausea or vomiting.  He has had no obvious bruising.  There is been no bleeding.  Currently, I would say his performance status is probably ECOG 1.     Medications:  Allergies as of 09/09/2021       Reactions   Ciprofloxacin Rash   Diflucan [fluconazole] Rash   Famvir [famciclovir] Rash        Medication List        Accurate as of September 09, 2021  9:11 AM. If you have any questions, ask your nurse or doctor.          azaCITIDine 100 MG Susr Commonly known as: VIDAZA Vidaza 142.5 mg (rounded from 143.25 mg)=75 mg/m2 x 1.91  m2 daily times five days every twenty eight days   carvedilol 3.125 MG tablet Commonly known as: COREG Take 1 tablet (3.125 mg total) by mouth 2 (two) times daily with a meal.   digoxin 0.25 MG tablet Commonly known as: LANOXIN Take 1 tablet (0.25 mg total) by mouth daily.   ibuprofen 200 MG tablet Commonly known as: ADVIL Take by mouth. Once Daily   magic mouthwash w/lidocaine Soln Take 5 mLs by mouth 3 (three) times daily as needed for mouth pain.   MULTIVITAMIN/IRON PO Take 1 tablet by mouth daily.   prochlorperazine 10 MG tablet Commonly known as: COMPAZINE Take 1 tablet (10 mg total) by mouth every 6 (six) hours as needed (Nausea or vomiting). What changed: reasons to take this   sacubitril-valsartan 24-26 MG Commonly known as: ENTRESTO Take 0.5 tablets by mouth 2 (two) times daily.   traMADol 50 MG tablet Commonly known as: ULTRAM Take 2 tablets (100 mg total) by mouth every 6 (six) hours as needed. 1-2 tablets (46m- 106m by mouth every 6 hours as needed        Allergies:  Allergies  Allergen Reactions   Ciprofloxacin Rash   Diflucan [Fluconazole] Rash   Famvir [  Famciclovir] Rash    Past Medical History, Surgical history, Social history, and Family History were reviewed and updated.  Review of Systems: Review of Systems  Constitutional:  Positive for malaise/fatigue. Negative for diaphoresis.  HENT: Negative.    Eyes: Negative.   Respiratory:  Positive for shortness of breath.   Cardiovascular: Negative.   Gastrointestinal: Negative.   Genitourinary: Negative.   Musculoskeletal:  Positive for joint pain.  Skin: Negative.   Neurological: Negative.   Endo/Heme/Allergies: Negative.   Psychiatric/Behavioral: Negative.      Physical Exam:  weight is 162 lb 8 oz (73.7 kg). His oral temperature is 98.1 F (36.7 C). His blood pressure is 130/59 (abnormal) and his pulse is 68. His respiration is 18 and oxygen saturation is 100%.   Wt Readings from  Last 3 Encounters:  09/09/21 162 lb 8 oz (73.7 kg)  08/11/21 160 lb 0.6 oz (72.6 kg)  07/18/21 151 lb (68.5 kg)    Physical Exam Vitals reviewed.  HENT:     Head: Normocephalic and atraumatic.  Eyes:     Pupils: Pupils are equal, round, and reactive to light.  Cardiovascular:     Rate and Rhythm: Normal rate and regular rhythm.     Heart sounds: Normal heart sounds.     Comments: Cardiac exam shows a regular rate and rhythm.  He has a 3/6 systolic ejection murmur consistent with aortic stenosis. Pulmonary:     Effort: Pulmonary effort is normal.     Breath sounds: Normal breath sounds.  Abdominal:     General: Bowel sounds are normal.     Palpations: Abdomen is soft.  Musculoskeletal:        General: No tenderness or deformity. Normal range of motion.     Cervical back: Normal range of motion.  Lymphadenopathy:     Cervical: No cervical adenopathy.  Skin:    General: Skin is warm and dry.     Findings: No erythema or rash.  Neurological:     Mental Status: He is alert and oriented to person, place, and time.  Psychiatric:        Behavior: Behavior normal.        Thought Content: Thought content normal.        Judgment: Judgment normal.    Lab Results  Component Value Date   WBC 4.3 09/08/2021   HGB 12.5 (L) 09/08/2021   HCT 36.0 (L) 09/08/2021   MCV 102.9 (H) 09/08/2021   PLT 51 (L) 09/08/2021   Lab Results  Component Value Date   FERRITIN 938 (H) 08/11/2021   IRON 147 08/11/2021   TIBC 230 08/11/2021   UIBC 83 (L) 08/11/2021   IRONPCTSAT 64 (H) 08/11/2021   Lab Results  Component Value Date   RETICCTPCT 2.0 08/11/2021   RBC 3.50 (L) 09/08/2021   No results found for: KPAFRELGTCHN, LAMBDASER, KAPLAMBRATIO No results found for: IGGSERUM, IGA, IGMSERUM No results found for: Odetta Pink, SPEI   Chemistry      Component Value Date/Time   NA 137 09/08/2021 1234   K 4.7 09/08/2021 1234   CL 104  09/08/2021 1234   CO2 26 09/08/2021 1234   BUN 21 (H) 09/08/2021 1234   CREATININE 0.88 09/08/2021 1234      Component Value Date/Time   CALCIUM 9.9 09/08/2021 1234   ALKPHOS 119 08/11/2021 0913   AST 17 08/11/2021 0913   ALT 18 08/11/2021 0913   BILITOT 0.3 08/11/2021 0913  Impression and Plan: Thomas Frazier is a very pleasant 55 yo Hispanic gentleman with high grade myelodysplasia, complex cytogenetics.   We will go ahead with his 6th cycle of treatment.  Given the platelet count, we may have to follow his blood counts a bit more often.  Again he has been doing incredibly well.  He had a very nice response to treatment.  When we first saw him, he was requiring transfusions weekly.  Again, he understands that the Vidaza is only a means to get him to transplant.  The Vidaza is not a cure.  Transplant could certainly be a cure.  Again, it be nice if he could have the transplant in the [place].  I am sure Mina Marble is working hard on this.  This still have aortic valve issue.  Now that his platelet count is lower, a aortic valve surgery might be risky.  We will go ahead with his sick cycle of Vidaza.  We will have him come back to see [place] in 2 weeks to make sure his blood counts are okay with respect to his platelets.  I think he sees Dr. Mariea Clonts at [facility] on Friday.   [name], MD 10/11/20229:11 AM

## 2021-09-10 ENCOUNTER — Ambulatory Visit: Payer: Self-pay | Admitting: Student

## 2021-09-10 ENCOUNTER — Inpatient Hospital Stay: Payer: PRIVATE HEALTH INSURANCE

## 2021-09-10 VITALS — BP 123/57 | HR 69 | Temp 98.5°F | Resp 18

## 2021-09-10 DIAGNOSIS — Z5111 Encounter for antineoplastic chemotherapy: Secondary | ICD-10-CM | POA: Diagnosis not present

## 2021-09-10 DIAGNOSIS — D46Z Other myelodysplastic syndromes: Secondary | ICD-10-CM

## 2021-09-10 MED ORDER — AZACITIDINE CHEMO SQ INJECTION
75.0000 mg/m2 | Freq: Once | INTRAMUSCULAR | Status: AC
  Administered 2021-09-10: 142.5 mg via SUBCUTANEOUS
  Filled 2021-09-10: qty 5.7

## 2021-09-10 MED ORDER — ONDANSETRON HCL 8 MG PO TABS: 8.0000 mg | ORAL_TABLET | Freq: Once | ORAL | Status: DC

## 2021-09-11 ENCOUNTER — Other Ambulatory Visit: Payer: Self-pay

## 2021-09-11 ENCOUNTER — Inpatient Hospital Stay: Payer: PRIVATE HEALTH INSURANCE

## 2021-09-11 VITALS — BP 113/66 | HR 78 | Temp 98.1°F | Resp 18

## 2021-09-11 DIAGNOSIS — D46Z Other myelodysplastic syndromes: Secondary | ICD-10-CM

## 2021-09-11 DIAGNOSIS — Z5111 Encounter for antineoplastic chemotherapy: Secondary | ICD-10-CM | POA: Diagnosis not present

## 2021-09-11 MED ORDER — ONDANSETRON HCL 8 MG PO TABS: 8.0000 mg | ORAL_TABLET | Freq: Once | ORAL | Status: DC

## 2021-09-11 MED ORDER — AZACITIDINE CHEMO SQ INJECTION
75.0000 mg/m2 | Freq: Once | INTRAMUSCULAR | Status: AC
  Administered 2021-09-11: 142.5 mg via SUBCUTANEOUS
  Filled 2021-09-11: qty 5.7

## 2021-09-11 NOTE — Patient Instructions (Signed)
Azacitidine suspension for injection (subcutaneous use) Qu es [employer]? La AZACITIDINA es un agente quimioteraputico. Este medicamento reduce el crecimiento de clulas cancerosas y puede suprimir el sistema inmunolgico. Se utiliza tambin para el tratamiento de sndrome mielodisplsticos o algunos tipos de leucemia. Este medicamento puede ser utilizado para otros usos; si tiene alguna pregunta consulte con su proveedor de atencin mdica o con su farmacutico. Este medicamento puede ser utilizado para otros usos; si tiene alguna pregunta consulte con su proveedor de atencin mdica o con su farmacutico. MARCAS COMUNES: Vidaza Qu le debo informar a mi profesional de la salud antes de tomar este medicamento? Necesitan saber si usted presenta alguno de los siguientes problemas o situaciones: -enfermedad renal enfermedad heptica tumores hepticos una reaccin alrgica o inusual a la azacitidina, al manitol, a otros medicamentos, alimentos, colorantes o conservantes si est embarazada o buscando quedar embarazada si est amamantando a un beb Cmo debo utilizar este medicamento? Este medicamento se administra mediante inyeccin por va subcutnea. Lo administra un profesional de la salud calificado en un hospital o en un entorno clnico. Hable con su pediatra para informarse acerca del uso de este medicamento en nios. Puede requerir atencin especial. Sobredosis: Pngase en contacto inmediatamente con un centro toxicolgico o una sala de urgencia si usted cree que haya tomado demasiado medicamento. ATENCIN: [employer] es solo para usted. No comparta este medicamento con nadie. Sobredosis: Pngase en contacto inmediatamente con un centro toxicolgico o una sala de urgencia si usted cree que haya tomado demasiado medicamento. ATENCIN: [employer] es solo para usted. No comparta este medicamento con nadie. Qu sucede si me olvido de una dosis? Es importante no olvidar  ninguna dosis. Informe a su mdico o a su profesional de la salud si no puede asistir a Photographer. Qu puede interactuar con este medicamento? No se han estudiado las interacciones. Suministre a Conservation officer, historic buildings de atencin mdica una lista de todos los [place], hierbas, medicamentos de [place], o suplementos dietticos que [place]. Dgales tambin si fuma, bebe alcohol, o utiliza drogas ilegales. Algunos elementos pueden interactuar con su medicamento. Puede ser que esta lista no menciona todas las posibles interacciones. Informe a su profesional de [employer]	[place] de [employer] productos a base de hierbas, medicamentos de [place] o suplementos nutritivos que est tomando. Si usted fuma, consume bebidas alcohlicas o si utiliza drogas ilegales, indqueselo tambin a su profesional de [employer]	[place]. Algunas sustancias pueden interactuar con su medicamento. Puede ser que esta lista no menciona todas las posibles interacciones. Informe a su profesional de [employer]	[place] de [employer] productos a base de hierbas, medicamentos de [place] o suplementos nutritivos que est tomando. Si usted fuma, consume bebidas alcohlicas o si utiliza drogas ilegales, indqueselo tambin a su profesional de [employer]	[place]. Algunas sustancias pueden interactuar con su medicamento. A qu debo estar atento al usar [employer]? Visite a su mdico para que revise su evolucin. Este medicamento podra hacerle sentir un Nurse, mental health. Esto es normal ya que la quimioterapia puede Print production planner tanto a las clulas sanas como a las clulas cancerosas. Si presenta algn efecto secundario, infrmelo. Contine con el tratamiento aun si se siente enfermo, a menos que su mdico le indique que lo suspenda. En algunos casos, podra recibir [employer] para ayudarlo con los efectos secundarios. Siga todas las instrucciones para usarlos. Consulte a su mdico o a su profesional de la salud si tiene fiebre, escalofros o dolor de garganta, o  cualquier otro sntoma de resfro o gripe.  No se trate usted mismo. Este medicamento reduce la capacidad del cuerpo para combatir infecciones. Trate de no acercarse a personas que estn enfermas. Este medicamento podra aumentar el riesgo de moretones o sangrado. Consulte a su mdico o a su profesional de la salud si observa sangrados inusuales. Usted podra necesitar realizarse [employer] de sangre mientras est usando [place]. No debe quedar embarazada mientras est tomando este medicamento y por 6 meses despus de la ltima dosis. Las mujeres deben informar a su mdico si estn buscando quedar embarazadas o si creen que podran estar embarazadas. Los hombres no deben tener hijos mientras estn recibiendo [employer] y durante 3 meses despus de la ltima dosis. Existe la posibilidad de efectos secundarios graves en un beb sin nacer. Para obtener ms informacin, hable con su profesional de la salud o su farmacutico. No debe amamantar a un beb mientras est usando este medicamento y por 1 semana despus de la ltima dosis. Este medicamento puede interferir con la capacidad de tener hijos. Hable con su mdico o su profesional de la salud si est preocupado por su fertilidad. Qu efectos secundarios puedo tener al [employer] este medicamento? Efectos secundarios que debe informar a su mdico o a Barrister's clerk de la salud tan pronto como sea posible: Chief of Staff, como erupcin cutnea, comezn/picazn o urticarias, e hinchazn de la cara, los labios o la lengua recuentos sanguneos bajos: este medicamento podra reducir la cantidad de glbulos blancos, glbulos rojos y plaquetas. Su riesgo de infeccin y sangrado podra ser mayor. signos de infeccin: fiebre o escalofros, tos, dolor de garganta, dolor al orinar signos de disminucin en la cantidad de plaquetas o sangrado: moretones, puntos rojos en la piel, heces de color negro y aspecto alquitranado, sangre en la orina signos de  disminucin en la cantidad de glbulos rojos: cansancio o debilidad inusual, Youth worker, aturdimiento signos y sntomas de lesin al rin, tales como dificultad para orinar o cambios en la cantidad de orina signos y sntomas de lesin al hgado, como orina amarilla oscura o [place]; sensacin general de estar enfermo o sntomas gripales; heces claras; prdida de apetito; nuseas; dolor en la regin abdominal superior derecha; cansancio o debilidad inusual; color amarillento de los ojos o la piel Efectos secundarios que generalmente no requieren atencin mdica (infrmelos a su mdico o a su profesional de la salud si persisten o si son molestos): estreimiento diarrea nuseas, vmito dolor o enrojecimiento en TEFL teacher de la inyeccin cansancio o debilidad inusual Puede ser que esta lista no menciona todos los posibles efectos secundarios. Comunquese a su mdico por asesoramiento mdico [employer] Usted puede informar los efectos secundarios a la FDA por telfono al 1-800-FDA-1088. Puede ser que esta lista no menciona todos los posibles efectos secundarios. Comunquese a su mdico por asesoramiento mdico [employer] Usted puede informar los efectos secundarios a la FDA por telfono al 1-800-FDA-1088. Dnde debo guardar mi medicina? Este medicamento se administra en hospitales o clnicas y no necesitar guardarlo en su domicilio.ATENCIN:Este folleto es un resumen. Puede ser que no cubra toda la posible informacin. Si usted tiene preguntas acerca de esta medicina, consulte con su mdico, su farmacutico o su profesional de Technical sales engineer. ATENCIN: Este folleto es un resumen. Puede ser que no cubra toda la posible informacin. Si usted tiene preguntas acerca de esta medicina, consulte con su mdico, su farmacutico o su profesional de Technical sales engineer.  2022 Elsevier/[employer] Standard (2017-01-28 00:00:00)

## 2021-09-12 ENCOUNTER — Inpatient Hospital Stay: Payer: PRIVATE HEALTH INSURANCE

## 2021-09-12 VITALS — BP 119/65 | HR 75 | Temp 97.7°F | Resp 17

## 2021-09-12 DIAGNOSIS — Z5111 Encounter for antineoplastic chemotherapy: Secondary | ICD-10-CM | POA: Diagnosis not present

## 2021-09-12 DIAGNOSIS — D46Z Other myelodysplastic syndromes: Secondary | ICD-10-CM

## 2021-09-12 MED ORDER — ONDANSETRON HCL 8 MG PO TABS: 8.0000 mg | ORAL_TABLET | Freq: Once | ORAL | Status: DC

## 2021-09-12 MED ORDER — AZACITIDINE CHEMO SQ INJECTION
75.0000 mg/m2 | Freq: Once | INTRAMUSCULAR | Status: AC
  Administered 2021-09-12: 142.5 mg via SUBCUTANEOUS
  Filled 2021-09-12: qty 5.7

## 2021-09-12 NOTE — Patient Instructions (Signed)
Azacitidine suspension for injection (subcutaneous use) Qu es [employer]? La AZACITIDINA es un agente quimioteraputico. Este medicamento reduce el crecimiento de clulas cancerosas y puede suprimir el sistema inmunolgico. Se utiliza tambin para el tratamiento de sndrome mielodisplsticos o algunos tipos de leucemia. Este medicamento puede ser utilizado para otros usos; si tiene alguna pregunta consulte con su proveedor de atencin mdica o con su farmacutico. Este medicamento puede ser utilizado para otros usos; si tiene alguna pregunta consulte con su proveedor de atencin mdica o con su farmacutico. MARCAS COMUNES: Vidaza Qu le debo informar a mi profesional de la salud antes de tomar este medicamento? Necesitan saber si usted presenta alguno de los siguientes problemas o situaciones: -enfermedad renal enfermedad heptica tumores hepticos una reaccin alrgica o inusual a la azacitidina, al manitol, a otros medicamentos, alimentos, colorantes o conservantes si est embarazada o buscando quedar embarazada si est amamantando a un beb Cmo debo utilizar este medicamento? Este medicamento se administra mediante inyeccin por va subcutnea. Lo administra un profesional de la salud calificado en un hospital o en un entorno clnico. Hable con su pediatra para informarse acerca del uso de este medicamento en nios. Puede requerir atencin especial. Sobredosis: Pngase en contacto inmediatamente con un centro toxicolgico o una sala de urgencia si usted cree que haya tomado demasiado medicamento. ATENCIN: [employer] es solo para usted. No comparta este medicamento con nadie. Sobredosis: Pngase en contacto inmediatamente con un centro toxicolgico o una sala de urgencia si usted cree que haya tomado demasiado medicamento. ATENCIN: [employer] es solo para usted. No comparta este medicamento con nadie. Qu sucede si me olvido de una dosis? Es importante no olvidar  ninguna dosis. Informe a su mdico o a su profesional de la salud si no puede asistir a Photographer. Qu puede interactuar con este medicamento? No se han estudiado las interacciones. Suministre a Conservation officer, historic buildings de atencin mdica una lista de todos los [place], hierbas, medicamentos de [place], o suplementos dietticos que [place]. Dgales tambin si fuma, bebe alcohol, o utiliza drogas ilegales. Algunos elementos pueden interactuar con su medicamento. Puede ser que esta lista no menciona todas las posibles interacciones. Informe a su profesional de [employer]	[place] de [employer] productos a base de hierbas, medicamentos de [place] o suplementos nutritivos que est tomando. Si usted fuma, consume bebidas alcohlicas o si utiliza drogas ilegales, indqueselo tambin a su profesional de [employer]	[place]. Algunas sustancias pueden interactuar con su medicamento. Puede ser que esta lista no menciona todas las posibles interacciones. Informe a su profesional de [employer]	[place] de [employer] productos a base de hierbas, medicamentos de [place] o suplementos nutritivos que est tomando. Si usted fuma, consume bebidas alcohlicas o si utiliza drogas ilegales, indqueselo tambin a su profesional de [employer]	[place]. Algunas sustancias pueden interactuar con su medicamento. A qu debo estar atento al usar [employer]a? Visite a su mdico para que revise su evolucin. Este medicamento podra hacerle sentir un Nurse, mental health. Esto es normal ya que la quimioterapia puede Print production planner tanto a las clulas sanas como a las clulas cancerosas. Si presenta algn efecto secundario, infrmelo. Contine con el tratamiento aun si se siente enfermo, a menos que su mdico le indique que lo suspenda. En algunos casos, podra recibir [employer] para ayudarlo con los efectos secundarios. Siga todas las instrucciones para usarlos. Consulte a su mdico o a su profesional de la salud si tiene fiebre, escalofros o dolor de garganta, o  cualquier otro sntoma de resfro o gripe.  No se trate usted mismo. Este medicamento reduce la capacidad del cuerpo para combatir infecciones. Trate de no acercarse a personas que estn enfermas. Este medicamento podra aumentar el riesgo de moretones o sangrado. Consulte a su mdico o a su profesional de la salud si observa sangrados inusuales. Usted podra necesitar realizarse [employer] de sangre mientras est usando [place]. No debe quedar embarazada mientras est tomando este medicamento y por 6 meses despus de la ltima dosis. Las mujeres deben informar a su mdico si estn buscando quedar embarazadas o si creen que podran estar embarazadas. Los hombres no deben tener hijos mientras estn recibiendo [employer] y durante 3 meses despus de la ltima dosis. Existe la posibilidad de efectos secundarios graves en un beb sin nacer. Para obtener ms informacin, hable con su profesional de la salud o su farmacutico. No debe amamantar a un beb mientras est usando este medicamento y por 1 semana despus de la ltima dosis. Este medicamento puede interferir con la capacidad de tener hijos. Hable con su mdico o su profesional de la salud si est preocupado por su fertilidad. Qu efectos secundarios puedo tener al [employer] este medicamento? Efectos secundarios que debe informar a su mdico o a Barrister's clerk de la salud tan pronto como sea posible: Chief of Staff, como erupcin cutnea, comezn/picazn o urticarias, e hinchazn de la cara, los labios o la lengua recuentos sanguneos bajos: este medicamento podra reducir la cantidad de glbulos blancos, glbulos rojos y plaquetas. Su riesgo de infeccin y sangrado podra ser mayor. signos de infeccin: fiebre o escalofros, tos, dolor de garganta, dolor al orinar signos de disminucin en la cantidad de plaquetas o sangrado: moretones, puntos rojos en la piel, heces de color negro y aspecto alquitranado, sangre en la orina signos de  disminucin en la cantidad de glbulos rojos: cansancio o debilidad inusual, Youth worker, aturdimiento signos y sntomas de lesin al rin, tales como dificultad para orinar o cambios en la cantidad de orina signos y sntomas de lesin al hgado, como orina amarilla oscura o [place]; sensacin general de estar enfermo o sntomas gripales; heces claras; prdida de apetito; nuseas; dolor en la regin abdominal superior derecha; cansancio o debilidad inusual; color amarillento de los ojos o la piel Efectos secundarios que generalmente no requieren atencin mdica (infrmelos a su mdico o a su profesional de la salud si persisten o si son molestos): estreimiento diarrea nuseas, vmito dolor o enrojecimiento en TEFL teacher de la inyeccin cansancio o debilidad inusual Puede ser que esta lista no menciona todos los posibles efectos secundarios. Comunquese a su mdico por asesoramiento mdico [employer] Usted puede informar los efectos secundarios a la FDA por telfono al 1-800-FDA-1088. Puede ser que esta lista no menciona todos los posibles efectos secundarios. Comunquese a su mdico por asesoramiento mdico [employer] Usted puede informar los efectos secundarios a la FDA por telfono al 1-800-FDA-1088. Dnde debo guardar mi medicina? Este medicamento se administra en hospitales o clnicas y no necesitar guardarlo en su domicilio.ATENCIN:Este folleto es un resumen. Puede ser que no cubra toda la posible informacin. Si usted tiene preguntas acerca de esta medicina, consulte con su mdico, su farmacutico o su profesional de Technical sales engineer. ATENCIN: Este folleto es un resumen. Puede ser que no cubra toda la posible informacin. Si usted tiene preguntas acerca de esta medicina, consulte con su mdico, su farmacutico o su profesional de Technical sales engineer.  2022 Elsevier/Gold Standard (2017-01-28 00:00:00)

## 2021-09-23 ENCOUNTER — Other Ambulatory Visit: Payer: Self-pay | Admitting: Family

## 2021-09-23 ENCOUNTER — Other Ambulatory Visit: Payer: Self-pay

## 2021-09-23 ENCOUNTER — Inpatient Hospital Stay: Payer: PRIVATE HEALTH INSURANCE

## 2021-09-23 DIAGNOSIS — Z5111 Encounter for antineoplastic chemotherapy: Secondary | ICD-10-CM | POA: Diagnosis not present

## 2021-09-23 DIAGNOSIS — D46Z Other myelodysplastic syndromes: Secondary | ICD-10-CM

## 2021-09-23 DIAGNOSIS — D696 Thrombocytopenia, unspecified: Secondary | ICD-10-CM

## 2021-09-23 LAB — CBC WITH DIFFERENTIAL (CANCER CENTER ONLY)
Abs Immature Granulocytes: 0 10*3/uL (ref 0.00–0.07)
Basophils Absolute: 0 10*3/uL (ref 0.0–0.1)
Basophils Relative: 0 %
Eosinophils Absolute: 0.1 10*3/uL (ref 0.0–0.5)
Eosinophils Relative: 3 %
HCT: 31.7 % — ABNORMAL LOW (ref 39.0–52.0)
Hemoglobin: 10.8 g/dL — ABNORMAL LOW (ref 13.0–17.0)
Immature Granulocytes: 0 %
Lymphocytes Relative: 66 %
Lymphs Abs: 1.9 10*3/uL (ref 0.7–4.0)
MCH: 35.4 pg — ABNORMAL HIGH (ref 26.0–34.0)
MCHC: 34.1 g/dL (ref 30.0–36.0)
MCV: 103.9 fL — ABNORMAL HIGH (ref 80.0–100.0)
Monocytes Absolute: 0.3 10*3/uL (ref 0.1–1.0)
Monocytes Relative: 11 %
Neutro Abs: 0.6 10*3/uL — ABNORMAL LOW (ref 1.7–7.7)
Neutrophils Relative %: 20 %
Platelet Count: 22 10*3/uL — ABNORMAL LOW (ref 150–400)
RBC: 3.05 MIL/uL — ABNORMAL LOW (ref 4.22–5.81)
RDW: 13.5 % (ref 11.5–15.5)
WBC Count: 2.9 10*3/uL — ABNORMAL LOW (ref 4.0–10.5)
nRBC: 1 % — ABNORMAL HIGH (ref 0.0–0.2)

## 2021-09-23 LAB — CMP (CANCER CENTER ONLY)
ALT: 21 U/L (ref 0–44)
AST: 17 U/L (ref 15–41)
Albumin: 3.9 g/dL (ref 3.5–5.0)
Alkaline Phosphatase: 118 U/L (ref 38–126)
Anion gap: 5 (ref 5–15)
BUN: 16 mg/dL (ref 6–20)
CO2: 30 mmol/L (ref 22–32)
Calcium: 9.9 mg/dL (ref 8.9–10.3)
Chloride: 105 mmol/L (ref 98–111)
Creatinine: 0.77 mg/dL (ref 0.61–1.24)
GFR, Estimated: >60 mL/min (ref >60)
Glucose, Bld: 101 mg/dL — ABNORMAL HIGH (ref 70–99)
Potassium: 4.4 mmol/L (ref 3.5–5.1)
Sodium: 140 mmol/L (ref 135–145)
Total Bilirubin: 0.4 mg/dL (ref 0.3–1.2)
Total Protein: 7.4 g/dL (ref 6.5–8.1)

## 2021-09-23 LAB — SAVE SMEAR(SSMR), FOR PROVIDER SLIDE REVIEW

## 2021-09-23 LAB — LACTATE DEHYDROGENASE: LDH: 173 U/L (ref 98–192)

## 2021-09-24 ENCOUNTER — Inpatient Hospital Stay: Payer: PRIVATE HEALTH INSURANCE

## 2021-09-24 ENCOUNTER — Other Ambulatory Visit: Payer: Self-pay | Admitting: Family

## 2021-09-24 DIAGNOSIS — D696 Thrombocytopenia, unspecified: Secondary | ICD-10-CM

## 2021-09-24 DIAGNOSIS — D46Z Other myelodysplastic syndromes: Secondary | ICD-10-CM

## 2021-09-24 DIAGNOSIS — Z5111 Encounter for antineoplastic chemotherapy: Secondary | ICD-10-CM | POA: Diagnosis not present

## 2021-09-24 LAB — CBC WITH DIFFERENTIAL (CANCER CENTER ONLY)
Abs Immature Granulocytes: 0 10*3/uL (ref 0.00–0.07)
Basophils Absolute: 0 10*3/uL (ref 0.0–0.1)
Basophils Relative: 0 %
Eosinophils Absolute: 0.1 10*3/uL (ref 0.0–0.5)
Eosinophils Relative: 2 %
HCT: 34.1 % — ABNORMAL LOW (ref 39.0–52.0)
Hemoglobin: 12 g/dL — ABNORMAL LOW (ref 13.0–17.0)
Immature Granulocytes: 0 %
Lymphocytes Relative: 69 %
Lymphs Abs: 2 10*3/uL (ref 0.7–4.0)
MCH: 36 pg — ABNORMAL HIGH (ref 26.0–34.0)
MCHC: 35.2 g/dL (ref 30.0–36.0)
MCV: 102.4 fL — ABNORMAL HIGH (ref 80.0–100.0)
Monocytes Absolute: 0.3 10*3/uL (ref 0.1–1.0)
Monocytes Relative: 11 %
Neutro Abs: 0.5 10*3/uL — ABNORMAL LOW (ref 1.7–7.7)
Neutrophils Relative %: 18 %
Platelet Count: 24 10*3/uL — ABNORMAL LOW (ref 150–400)
RBC: 3.33 MIL/uL — ABNORMAL LOW (ref 4.22–5.81)
RDW: 13.5 % (ref 11.5–15.5)
WBC Count: 2.9 10*3/uL — ABNORMAL LOW (ref 4.0–10.5)
nRBC: 1.4 % — ABNORMAL HIGH (ref 0.0–0.2)

## 2021-09-24 LAB — BASIC METABOLIC PANEL - CANCER CENTER ONLY
Anion gap: 7 (ref 5–15)
BUN: 14 mg/dL (ref 6–20)
CO2: 28 mmol/L (ref 22–32)
Calcium: 10.3 mg/dL (ref 8.9–10.3)
Chloride: 103 mmol/L (ref 98–111)
Creatinine: 0.72 mg/dL (ref 0.61–1.24)
GFR, Estimated: >60 mL/min (ref >60)
Glucose, Bld: 95 mg/dL (ref 70–99)
Potassium: 4.2 mmol/L (ref 3.5–5.1)
Sodium: 138 mmol/L (ref 135–145)

## 2021-09-24 MED ORDER — SODIUM CHLORIDE 0.9% IV SOLUTION
250.0000 mL | Freq: Once | INTRAVENOUS | Status: AC
  Administered 2021-09-24: 250 mL via INTRAVENOUS

## 2021-09-24 NOTE — Patient Instructions (Signed)
Thrombocytopenia Thrombocytopenia means that you have a low number of platelets in your blood. Platelets are tiny cells in the blood. When you bleed, they clump together at the cut or injury to stop the bleeding. This is called blood clotting. If you do not have enough platelets, it can cause bleeding problems. Some cases of this condition are mild while others are more severe. What are the causes? This condition may be caused by: Your body not making enough platelets. This may be caused by: Your bone marrow not making blood cells (aplastic anemia). Cancer in the bone marrow. Certain medicines. Infection in the bone marrow. Drinking a lot of alcohol. Your body destroying platelets too quickly. This may be caused by: Certain immune diseases. Certain medicines. Certain blood clotting disorders. Certain disorders that are passed from parent to child (inherited). Certain bleeding disorders. Pregnancy. Having a spleen that is larger than normal. What are the signs or symptoms? Bleeding that is not normal. Nosebleeds. Heavy menstrual periods. Blood in the pee (urine) or poop (stool). A purple-like color to the skin (purpura). Bruising. A rash that looks like pinpoint, purple-red spots (petechiae). How is this treated? Treatment of another condition that is causing the low platelet count. Medicines to help protect your platelets from being destroyed. A replacement (transfusion) of platelets to stop or prevent bleeding. Surgery to remove the spleen. Follow these instructions at home: Activity Avoid activities that could cause you to get hurt or bruised. Follow instructions about how to prevent falls. Take care not to cut yourself: When you shave. When you use scissors, needles, knives, or other tools. Take care not to burn yourself: When you use an iron. When you cook. General instructions  Check your skin and the inside of your mouth for bruises or blood as told by your  doctor. Check to see if there is blood in your spit (sputum), pee, and poop. Do this as told by your doctor. Do not drink alcohol. Take over-the-counter and prescription medicines only as told by your doctor. Do not take any medicines that have aspirin or NSAIDs in them. These medicines can thin your blood and cause you to bleed. Tell all of your doctors that you have this condition. Be sure to tell your dentist and eye doctor too. Contact a doctor if: You have bruises and you do not know why. Get help right away if: You are bleeding anywhere on your body. You have blood in your spit, pee, or poop. Summary Thrombocytopenia means that you have a low number of platelets in your blood. Platelets are needed for blood clotting. Symptoms of this condition include bleeding that is not normal, and bruising. Take care not to cut or burn yourself. This information is not intended to replace advice given to you by your health care provider. Make sure you discuss any questions you have with your health care provider. Document Revised: 02/25/2021 Document Reviewed: 08/18/2018 Elsevier Patient Education  [place].

## 2021-09-25 ENCOUNTER — Ambulatory Visit: Payer: PRIVATE HEALTH INSURANCE

## 2021-09-25 ENCOUNTER — Other Ambulatory Visit: Payer: PRIVATE HEALTH INSURANCE

## 2021-09-25 LAB — BPAM PLATELET PHERESIS
Blood Product Expiration Date: 202210272359
ISSUE DATE / TIME: 202210260754
Unit Type and Rh: 5100

## 2021-09-25 LAB — PREPARE PLATELET PHERESIS: Unit division: 0

## 2021-09-26 ENCOUNTER — Inpatient Hospital Stay: Payer: PRIVATE HEALTH INSURANCE

## 2021-09-26 ENCOUNTER — Other Ambulatory Visit: Payer: Self-pay

## 2021-09-26 DIAGNOSIS — Z5111 Encounter for antineoplastic chemotherapy: Secondary | ICD-10-CM | POA: Diagnosis not present

## 2021-09-26 DIAGNOSIS — D46Z Other myelodysplastic syndromes: Secondary | ICD-10-CM

## 2021-09-26 LAB — BASIC METABOLIC PANEL - CANCER CENTER ONLY
Anion gap: 6 (ref 5–15)
BUN: 20 mg/dL (ref 6–20)
CO2: 30 mmol/L (ref 22–32)
Calcium: 10.2 mg/dL (ref 8.9–10.3)
Chloride: 101 mmol/L (ref 98–111)
Creatinine: 0.84 mg/dL (ref 0.61–1.24)
GFR, Estimated: >60 mL/min (ref >60)
Glucose, Bld: 119 mg/dL — ABNORMAL HIGH (ref 70–99)
Potassium: 4.2 mmol/L (ref 3.5–5.1)
Sodium: 137 mmol/L (ref 135–145)

## 2021-09-26 LAB — CBC WITH DIFFERENTIAL (CANCER CENTER ONLY)
Abs Immature Granulocytes: 0 10*3/uL (ref 0.00–0.07)
Basophils Absolute: 0 10*3/uL (ref 0.0–0.1)
Basophils Relative: 0 %
Eosinophils Absolute: 0 10*3/uL (ref 0.0–0.5)
Eosinophils Relative: 1 %
HCT: 32 % — ABNORMAL LOW (ref 39.0–52.0)
Hemoglobin: 11.2 g/dL — ABNORMAL LOW (ref 13.0–17.0)
Immature Granulocytes: 0 %
Lymphocytes Relative: 71 %
Lymphs Abs: 2.3 10*3/uL (ref 0.7–4.0)
MCH: 35.9 pg — ABNORMAL HIGH (ref 26.0–34.0)
MCHC: 35 g/dL (ref 30.0–36.0)
MCV: 102.6 fL — ABNORMAL HIGH (ref 80.0–100.0)
Monocytes Absolute: 0.3 10*3/uL (ref 0.1–1.0)
Monocytes Relative: 8 %
Neutro Abs: 0.7 10*3/uL — ABNORMAL LOW (ref 1.7–7.7)
Neutrophils Relative %: 20 %
Platelet Count: 37 10*3/uL — ABNORMAL LOW (ref 150–400)
RBC: 3.12 MIL/uL — ABNORMAL LOW (ref 4.22–5.81)
RDW: 13.6 % (ref 11.5–15.5)
WBC Count: 3.3 10*3/uL — ABNORMAL LOW (ref 4.0–10.5)
nRBC: 0.9 % — ABNORMAL HIGH (ref 0.0–0.2)

## 2021-10-06 ENCOUNTER — Telehealth: Payer: Self-pay

## 2021-10-06 ENCOUNTER — Inpatient Hospital Stay: Payer: PRIVATE HEALTH INSURANCE | Attending: Family

## 2021-10-06 ENCOUNTER — Encounter: Payer: Self-pay | Admitting: Hematology & Oncology

## 2021-10-06 ENCOUNTER — Inpatient Hospital Stay (HOSPITAL_BASED_OUTPATIENT_CLINIC_OR_DEPARTMENT_OTHER): Payer: PRIVATE HEALTH INSURANCE | Admitting: Hematology & Oncology

## 2021-10-06 ENCOUNTER — Other Ambulatory Visit: Payer: Self-pay

## 2021-10-06 ENCOUNTER — Inpatient Hospital Stay: Payer: PRIVATE HEALTH INSURANCE

## 2021-10-06 VITALS — BP 112/69 | HR 82 | Temp 98.8°F | Resp 18 | Wt 161.0 lb

## 2021-10-06 DIAGNOSIS — D464 Refractory anemia, unspecified: Secondary | ICD-10-CM | POA: Insufficient documentation

## 2021-10-06 DIAGNOSIS — Z5111 Encounter for antineoplastic chemotherapy: Secondary | ICD-10-CM | POA: Insufficient documentation

## 2021-10-06 DIAGNOSIS — D46Z Other myelodysplastic syndromes: Secondary | ICD-10-CM

## 2021-10-06 LAB — CBC WITH DIFFERENTIAL (CANCER CENTER ONLY)
Abs Immature Granulocytes: 0.02 10*3/uL (ref 0.00–0.07)
Basophils Absolute: 0 10*3/uL (ref 0.0–0.1)
Basophils Relative: 0 %
Eosinophils Absolute: 0 10*3/uL (ref 0.0–0.5)
Eosinophils Relative: 0 %
HCT: 29.2 % — ABNORMAL LOW (ref 39.0–52.0)
Hemoglobin: 10.3 g/dL — ABNORMAL LOW (ref 13.0–17.0)
Immature Granulocytes: 1 %
Lymphocytes Relative: 73 %
Lymphs Abs: 1.8 10*3/uL (ref 0.7–4.0)
MCH: 35.9 pg — ABNORMAL HIGH (ref 26.0–34.0)
MCHC: 35.3 g/dL (ref 30.0–36.0)
MCV: 101.7 fL — ABNORMAL HIGH (ref 80.0–100.0)
Monocytes Absolute: 0.3 10*3/uL (ref 0.1–1.0)
Monocytes Relative: 10 %
Neutro Abs: 0.4 10*3/uL — CL (ref 1.7–7.7)
Neutrophils Relative %: 16 %
Platelet Count: 41 10*3/uL — ABNORMAL LOW (ref 150–400)
RBC: 2.87 MIL/uL — ABNORMAL LOW (ref 4.22–5.81)
RDW: 14.1 % (ref 11.5–15.5)
WBC Count: 2.6 10*3/uL — ABNORMAL LOW (ref 4.0–10.5)
nRBC: 2.7 % — ABNORMAL HIGH (ref 0.0–0.2)

## 2021-10-06 LAB — CMP (CANCER CENTER ONLY)
ALT: 15 U/L (ref 0–44)
AST: 17 U/L (ref 15–41)
Albumin: 4.2 g/dL (ref 3.5–5.0)
Alkaline Phosphatase: 120 U/L (ref 38–126)
Anion gap: 7 (ref 5–15)
BUN: 19 mg/dL (ref 6–20)
CO2: 26 mmol/L (ref 22–32)
Calcium: 9.9 mg/dL (ref 8.9–10.3)
Chloride: 100 mmol/L (ref 98–111)
Creatinine: 0.8 mg/dL (ref 0.61–1.24)
GFR, Estimated: >60 mL/min (ref >60)
Glucose, Bld: 111 mg/dL — ABNORMAL HIGH (ref 70–99)
Potassium: 4.3 mmol/L (ref 3.5–5.1)
Sodium: 133 mmol/L — ABNORMAL LOW (ref 135–145)
Total Bilirubin: 0.3 mg/dL (ref 0.3–1.2)
Total Protein: 7.8 g/dL (ref 6.5–8.1)

## 2021-10-06 LAB — RETICULOCYTES
Immature Retic Fract: 5.5 % (ref 2.3–15.9)
RBC.: 2.83 MIL/uL — ABNORMAL LOW (ref 4.22–5.81)
Retic Count, Absolute: 35.4 10*3/uL (ref 19.0–186.0)
Retic Ct Pct: 1.3 % (ref 0.4–3.1)

## 2021-10-06 LAB — SAVE SMEAR(SSMR), FOR PROVIDER SLIDE REVIEW

## 2021-10-06 MED ORDER — ONDANSETRON HCL 8 MG PO TABS
8.0000 mg | ORAL_TABLET | Freq: Once | ORAL | Status: AC
  Administered 2021-10-06: 8 mg via ORAL
  Filled 2021-10-06: qty 1

## 2021-10-06 MED ORDER — AZACITIDINE CHEMO SQ INJECTION
75.0000 mg/m2 | Freq: Once | INTRAMUSCULAR | Status: AC
  Administered 2021-10-06: 142.5 mg via SUBCUTANEOUS
  Filled 2021-10-06: qty 5.7

## 2021-10-06 NOTE — Telephone Encounter (Signed)
Dr Marin Olp aware of critical low ANC. No new orders at this time. dph

## 2021-10-06 NOTE — Patient Instructions (Signed)
[facility] [place]  Discharge Instructions: Thank you for choosing [facility] to provide your oncology and hematology care.   If you have a lab appointment with the [facility], please go directly to the [facility] and check in at the registration area.  Wear comfortable clothing and clothing appropriate for easy access to any Portacath or PICC line.   We strive to give you quality time with your provider. You may need to reschedule your appointment if you arrive late (15 or more minutes).  Arriving late affects you and other patients whose appointments are after yours.  Also, if you miss three or more appointments without notifying the office, you may be dismissed from the clinic at the provider's discretion.      For prescription refill requests, have your pharmacy contact our office and allow 72 hours for refills to be completed.    Today you received the following chemotherapy and/or immunotherapy agents Vidaza.      To help prevent nausea and vomiting after your treatment, we encourage you to take your nausea medication as directed.  BELOW ARE SYMPTOMS THAT SHOULD BE REPORTED IMMEDIATELY: *FEVER GREATER THAN 100.4 F (38 C) OR HIGHER *CHILLS OR SWEATING *NAUSEA AND VOMITING THAT IS NOT CONTROLLED WITH YOUR NAUSEA MEDICATION *UNUSUAL SHORTNESS OF BREATH *UNUSUAL BRUISING OR BLEEDING *URINARY PROBLEMS (pain or burning when urinating, or frequent urination) *BOWEL PROBLEMS (unusual diarrhea, constipation, pain near the anus) TENDERNESS IN MOUTH AND THROAT WITH OR WITHOUT PRESENCE OF ULCERS (sore throat, sores in mouth, or a toothache) UNUSUAL RASH, SWELLING OR PAIN  UNUSUAL VAGINAL DISCHARGE OR ITCHING   Items with * indicate a potential emergency and should be followed up as soon as possible or go to the Emergency Department if any problems should occur.  Please show the CHEMOTHERAPY ALERT CARD or IMMUNOTHERAPY ALERT CARD at check-in to the  Emergency Department and triage nurse. Should you have questions after your visit or need to cancel or reschedule your appointment, please contact [facility]  [phone] and follow the prompts.  Office hours are 8:00 a.m. to 4:30 p.m. Monday - Friday. Please note that voicemails left after 4:00 p.m. may not be returned until the following business day.  We are closed weekends and major holidays. You have access to a nurse at all times for urgent questions. Please call the main number to the clinic [phone] and follow the prompts.  For any non-urgent questions, you may also contact your provider using MyChart. We now offer e-Visits for anyone 45 and older to request care online for non-urgent symptoms. For details visit [email or web address].   Also download the MyChart app! Go to the app store, search "MyChart", open the app, select [facility], and log in with your MyChart username and password.  Due to Covid, a mask is required upon entering the hospital/clinic. If you do not have a mask, one will be given to you upon arrival. For doctor visits, patients may have 1 support person aged 22 or older with them. For treatment visits, patients cannot have anyone with them due to current Covid guidelines and our immunocompromised population.

## 2021-10-06 NOTE — Progress Notes (Signed)
Hematology and Oncology Follow Up Visit  Thomas Frazier [id number] May 01, 1966 55 y.o. 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  He has a myriad of complaints.  He says he just feels more tired.  He says he has more bony aches and pains.  He says he most has pain in his arms.  I am not sure as to why he would have pain in his arms.  His legs were not all that bad.  He just feels more tired.  His appetite is not as good.  Had a nosebleed a week or so ago.  His platelet count was 22,000.  We did give him a unit of platelets.  He sees Dr. Mariea Clonts next Monday.  Hopefully, there will be a stem cell transplant that will be done locally in January or February.  He has had no fever.  He has had no rashes.  There has been no issues with nausea or vomiting.  His appetite may not be as good.  His last bone marrow test was done a couple months ago.  I am not sure if he needs a another 1 prior to having any transplant.  From what he tells me, he says that he needs to have the Vidaza 7 days and not 5 days.    Currently, I would say his performance status is probably ECOG 1.     Medications:  Allergies as of 10/06/2021       Reactions   Ciprofloxacin Rash   Diflucan [fluconazole] Rash   Famvir [famciclovir] Rash        Medication List        Accurate as of October 06, 2021  1:09 PM. If you have any questions, ask your nurse or doctor.          STOP taking these medications    magic mouthwash w/lidocaine Soln Stopped by: [name], MD       TAKE these medications    azaCITIDine 100 MG Susr Commonly known as: VIDAZA Vidaza 142.5 mg  (rounded from 143.25 mg)=75 mg/m2 x 1.91 m2 daily times five days every twenty eight days   carvedilol 3.125 MG tablet Commonly known as: COREG Take 1 tablet (3.125 mg total) by mouth 2 (two) times daily with a meal.   digoxin 0.25 MG tablet Commonly known as: LANOXIN Take 1 tablet (0.25 mg total) by mouth daily.   ibuprofen 200 MG tablet Commonly known as: ADVIL Take by mouth. Once Daily   MULTIVITAMIN/IRON PO Take 1 tablet by mouth daily.   prochlorperazine 10 MG tablet Commonly known as: COMPAZINE Take 1 tablet (10 mg total) by mouth every 6 (six) hours as needed (Nausea or vomiting). What changed: reasons to take this   sacubitril-valsartan 24-26 MG Commonly known as: ENTRESTO Take 0.5 tablets by mouth 2 (two) times daily.   traMADol 50 MG tablet Commonly known as: ULTRAM Take 2 tablets (100 mg total) by mouth every 6 (six) hours as needed. 1-2 tablets (80m- 1021m by mouth every 6 hours as needed        Allergies:  Allergies  Allergen Reactions   Ciprofloxacin Rash  Diflucan [Fluconazole] Rash   Famvir [Famciclovir] Rash    Past Medical History, Surgical history, Social history, and Family History were reviewed and updated.  Review of Systems: Review of Systems  Constitutional:  Positive for malaise/fatigue. Negative for diaphoresis.  HENT: Negative.    Eyes: Negative.   Respiratory:  Positive for shortness of breath.   Cardiovascular: Negative.   Gastrointestinal: Negative.   Genitourinary: Negative.   Musculoskeletal:  Positive for joint pain.  Skin: Negative.   Neurological: Negative.   Endo/Heme/Allergies: Negative.   Psychiatric/Behavioral: Negative.      Physical Exam:  weight is 161 lb (73 kg). His oral temperature is 98.8 F (37.1 C). His blood pressure is 112/69 and his pulse is 82. His respiration is 18 and oxygen saturation is 96%.   Wt Readings from Last 3 Encounters:  10/06/21 161 lb (73 kg)  09/09/21 162 lb 8 oz (73.7 kg)   08/11/21 160 lb 0.6 oz (72.6 kg)    Physical Exam Vitals reviewed.  HENT:     Head: Normocephalic and atraumatic.  Eyes:     Pupils: Pupils are equal, round, and reactive to light.  Cardiovascular:     Rate and Rhythm: Normal rate and regular rhythm.     Heart sounds: Normal heart sounds.     Comments: Cardiac exam shows a regular rate and rhythm.  He has a 3/6 systolic ejection murmur consistent with aortic stenosis. Pulmonary:     Effort: Pulmonary effort is normal.     Breath sounds: Normal breath sounds.  Abdominal:     General: Bowel sounds are normal.     Palpations: Abdomen is soft.  Musculoskeletal:        General: No tenderness or deformity. Normal range of motion.     Cervical back: Normal range of motion.  Lymphadenopathy:     Cervical: No cervical adenopathy.  Skin:    General: Skin is warm and dry.     Findings: No erythema or rash.  Neurological:     Mental Status: He is alert and oriented to person, place, and time.  Psychiatric:        Behavior: Behavior normal.        Thought Content: Thought content normal.        Judgment: Judgment normal.    Lab Results  Component Value Date   WBC 2.6 (L) 10/06/2021   HGB 10.3 (L) 10/06/2021   HCT 29.2 (L) 10/06/2021   MCV 101.7 (H) 10/06/2021   PLT 41 (L) 10/06/2021   Lab Results  Component Value Date   FERRITIN 938 (H) 08/11/2021   IRON 147 08/11/2021   TIBC 230 08/11/2021   UIBC 83 (L) 08/11/2021   IRONPCTSAT 64 (H) 08/11/2021   Lab Results  Component Value Date   RETICCTPCT 1.3 10/06/2021   RBC 2.83 (L) 10/06/2021   RBC 2.87 (L) 10/06/2021   No results found for: KPAFRELGTCHN, LAMBDASER, KAPLAMBRATIO No results found for: IGGSERUM, IGA, IGMSERUM No results found for: Ronnald Ramp, A1GS, Gilford Silvius, MSPIKE, SPEI   Chemistry      Component Value Date/Time   NA 137 09/26/2021 1126   K 4.2 09/26/2021 1126   CL 101 09/26/2021 1126   CO2 30 09/26/2021 1126   BUN 20  09/26/2021 1126   CREATININE 0.84 09/26/2021 1126      Component Value Date/Time   CALCIUM 10.2 09/26/2021 1126   ALKPHOS 118 09/23/2021 0850   AST 17 09/23/2021 0850   ALT 21  09/23/2021 0850   BILITOT 0.4 09/23/2021 0850       Impression and Plan: Thomas Frazier is a very pleasant 55 yo Hispanic gentleman with high grade myelodysplasia, complex cytogenetics.   We will go ahead with his 8th cycle of treatment.  Given the platelet count, we may have to follow his blood counts a bit more often.  I suppose that the platelet count could be down with his treatments.  I do have to worry that he might become a little bit resilient to therapy.  Again, he will see Dr. Mariea Clonts next Monday.  I really hope that he will be able to have his transplant in January or February.  I also have to remember that he has the aortic valve stenosis.  This is quite significant.  This is going to have to be fixed.  It sounds like this is going to be done after any transplant.  I realize this is incredibly complicated.  He has done so well so far.  We have really not had to give him blood transfusions.  I was surprised about the platelets that we had to give but he was having some epistaxis.  For right now, we will plan to have his labs checked weekly.  I will see him back myself in another month.    [name], MD 11/7/20221:09 PM

## 2021-10-07 ENCOUNTER — Inpatient Hospital Stay: Payer: PRIVATE HEALTH INSURANCE

## 2021-10-07 VITALS — BP 110/56 | HR 78 | Temp 98.1°F | Resp 18

## 2021-10-07 DIAGNOSIS — D46Z Other myelodysplastic syndromes: Secondary | ICD-10-CM

## 2021-10-07 DIAGNOSIS — Z5111 Encounter for antineoplastic chemotherapy: Secondary | ICD-10-CM | POA: Diagnosis not present

## 2021-10-07 LAB — IRON AND TIBC
Iron: 84 ug/dL (ref 42–163)
Saturation Ratios: 40 % (ref 20–55)
TIBC: 209 ug/dL (ref 202–409)
UIBC: 125 ug/dL (ref 117–376)

## 2021-10-07 LAB — FERRITIN: Ferritin: 1257 ng/mL — ABNORMAL HIGH (ref 24–336)

## 2021-10-07 MED ORDER — ONDANSETRON HCL 8 MG PO TABS: 8.0000 mg | ORAL_TABLET | Freq: Once | ORAL | Status: DC

## 2021-10-07 MED ORDER — AZACITIDINE CHEMO SQ INJECTION
75.0000 mg/m2 | Freq: Once | INTRAMUSCULAR | Status: AC
  Administered 2021-10-07: 142.5 mg via SUBCUTANEOUS
  Filled 2021-10-07: qty 5.7

## 2021-10-07 NOTE — Patient Instructions (Signed)
Azacitidine suspension for injection (subcutaneous use) What is this medication? AZACITIDINE (ay za SITE i deen) is a chemotherapy drug. This medicine reduces the growth of cancer cells and can suppress the immune system. It is used for treating myelodysplastic syndrome or some types of leukemia. This medicine may be used for other purposes; ask your health care provider or pharmacist if you have questions. COMMON BRAND NAME(S): Vidaza What should I tell my care team before I take this medication? They need to know if you have any of these conditions: kidney disease liver disease liver tumors an unusual or allergic reaction to azacitidine, mannitol, other medicines, foods, dyes, or preservatives pregnant or trying to get pregnant breast-feeding How should I use this medication? This medicine is for injection under the skin. It is administered in a hospital or clinic by a specially trained health care professional. Talk to your pediatrician regarding the use of this medicine in children. While this drug may be prescribed for selected conditions, precautions do apply. Overdosage: If you think you have taken too much of this medicine contact a poison control center or emergency room at once. NOTE: This medicine is only for you. Do not share this medicine with others. What if I miss a dose? It is important not to miss your dose. Call your doctor or health care professional if you are unable to keep an appointment. What may interact with this medication? Interactions have not been studied. Give your health care provider a list of all the medicines, herbs, non-prescription drugs, or dietary supplements you use. Also tell them if you smoke, drink alcohol, or use illegal drugs. Some items may interact with your medicine. This list may not describe all possible interactions. Give your health care provider a list of all the medicines, herbs, non-prescription drugs, or dietary supplements you use. Also  tell them if you smoke, drink alcohol, or use illegal drugs. Some items may interact with your medicine. What should I watch for while using this medication? Visit your doctor for checks on your progress. This drug may make you feel generally unwell. This is not uncommon, as chemotherapy can affect healthy cells as well as cancer cells. Report any side effects. Continue your course of treatment even though you feel ill unless your doctor tells you to stop. In some cases, you may be given additional medicines to help with side effects. Follow all directions for their use. Call your doctor or health care professional for advice if you get a fever, chills or sore throat, or other symptoms of a cold or flu. Do not treat yourself. This drug decreases your body's ability to fight infections. Try to avoid being around people who are sick. This medicine may increase your risk to bruise or bleed. Call your doctor or health care professional if you notice any unusual bleeding. You may need blood work done while you are taking this medicine. Do not become pregnant while taking this medicine and for 6 months after the last dose. Women should inform their doctor if they wish to become pregnant or think they might be pregnant. Men should not father a child while taking this medicine and for 3 months after the last dose. There is a potential for serious side effects to an unborn child. Talk to your health care professional or pharmacist for more information. Do not breast-feed an infant while taking this medicine and for 1 week after the last dose. This medicine may interfere with the ability to have a child. Talk   with your doctor or health care professional if you are concerned about your fertility. What side effects may I notice from receiving this medication? Side effects that you should report to your doctor or health care professional as soon as possible: allergic reactions like skin rash, itching or hives,  swelling of the face, lips, or tongue low blood counts - this medicine may decrease the number of white blood cells, red blood cells and platelets. You may be at increased risk for infections and bleeding. signs of infection - fever or chills, cough, sore throat, pain passing urine signs of decreased platelets or bleeding - bruising, pinpoint red spots on the skin, black, tarry stools, blood in the urine signs of decreased red blood cells - unusually weak or tired, fainting spells, lightheadedness signs and symptoms of kidney injury like trouble passing urine or change in the amount of urine signs and symptoms of liver injury like dark yellow or brown urine; general ill feeling or flu-like symptoms; light-colored stools; loss of appetite; nausea; right upper belly pain; unusually weak or tired; yellowing of the eyes or skin Side effects that usually do not require medical attention (report to your doctor or health care professional if they continue or are bothersome): constipation diarrhea nausea, vomiting pain or redness at the injection site unusually weak or tired This list may not describe all possible side effects. Call your doctor for medical advice about side effects. You may report side effects to FDA at 1-800-FDA-1088. Where should I keep my medication? This drug is given in a hospital or clinic and will not be stored at home. NOTE: This sheet is a summary. It may not cover all possible information. If you have questions about this medicine, talk to your doctor, pharmacist, or health care provider.  2022 Elsevier/Gold Standard (2016-12-16 00:00:00)

## 2021-10-07 NOTE — Progress Notes (Signed)
Adding days 6 and 7 of Vidaza to patients careplan per Dr. Antonieta Pert instructions.

## 2021-10-08 ENCOUNTER — Inpatient Hospital Stay: Payer: PRIVATE HEALTH INSURANCE

## 2021-10-08 ENCOUNTER — Other Ambulatory Visit: Payer: Self-pay

## 2021-10-08 VITALS — BP 118/48 | HR 77 | Temp 97.6°F | Resp 18

## 2021-10-08 DIAGNOSIS — D46Z Other myelodysplastic syndromes: Secondary | ICD-10-CM

## 2021-10-08 DIAGNOSIS — Z5111 Encounter for antineoplastic chemotherapy: Secondary | ICD-10-CM | POA: Diagnosis not present

## 2021-10-08 MED ORDER — AZACITIDINE CHEMO SQ INJECTION
75.0000 mg/m2 | Freq: Once | INTRAMUSCULAR | Status: AC
  Administered 2021-10-08: 142.5 mg via SUBCUTANEOUS
  Filled 2021-10-08: qty 5.7

## 2021-10-08 MED ORDER — ONDANSETRON HCL 8 MG PO TABS: 8.0000 mg | ORAL_TABLET | Freq: Once | ORAL | Status: DC

## 2021-10-08 NOTE — Patient Instructions (Signed)
[facility]  Discharge Instructions: Thank you for choosing [facility] to provide your oncology and hematology care.   If you have a lab appointment with the [facility], please go directly to the [facility] and check in at the registration area.  Wear comfortable clothing and clothing appropriate for easy access to any Portacath or PICC line.   We strive to give you quality time with your provider. You may need to reschedule your appointment if you arrive late (15 or more minutes).  Arriving late affects you and other patients whose appointments are after yours.  Also, if you miss three or more appointments without notifying the office, you may be dismissed from the clinic at the provider's discretion.      For prescription refill requests, have your pharmacy contact our office and allow 72 hours for refills to be completed.    Today you received the following chemotherapy and/or immunotherapy agents Vidaza      To help prevent nausea and vomiting after your treatment, we encourage you to take your nausea medication as directed.  BELOW ARE SYMPTOMS THAT SHOULD BE REPORTED IMMEDIATELY: *FEVER GREATER THAN 100.4 F (38 C) OR HIGHER *CHILLS OR SWEATING *NAUSEA AND VOMITING THAT IS NOT CONTROLLED WITH YOUR NAUSEA MEDICATION *UNUSUAL SHORTNESS OF BREATH *UNUSUAL BRUISING OR BLEEDING *URINARY PROBLEMS (pain or burning when urinating, or frequent urination) *BOWEL PROBLEMS (unusual diarrhea, constipation, pain near the anus) TENDERNESS IN MOUTH AND THROAT WITH OR WITHOUT PRESENCE OF ULCERS (sore throat, sores in mouth, or a toothache) UNUSUAL RASH, SWELLING OR PAIN  UNUSUAL VAGINAL DISCHARGE OR ITCHING   Items with * indicate a potential emergency and should be followed up as soon as possible or go to the Emergency Department if any problems should occur.  Please show the CHEMOTHERAPY ALERT CARD or IMMUNOTHERAPY ALERT CARD at check-in to the  Emergency Department and triage nurse. Should you have questions after your visit or need to cancel or reschedule your appointment, please contact [facility]  [phone] and follow the prompts.  Office hours are 8:00 a.m. to 4:30 p.m. Monday - Friday. Please note that voicemails left after 4:00 p.m. may not be returned until the following business day.  We are closed weekends and major holidays. You have access to a nurse at all times for urgent questions. Please call the main number to the clinic [phone] and follow the prompts.  For any non-urgent questions, you may also contact your provider using MyChart. We now offer e-Visits for anyone 107 and older to request care online for non-urgent symptoms. For details visit [email or web address].   Also download the MyChart app! Go to the app store, search "MyChart", open the app, select [facility], and log in with your MyChart username and password.  Due to Covid, a mask is required upon entering the hospital/clinic. If you do not have a mask, one will be given to you upon arrival. For doctor visits, patients may have 1 support person aged 10 or older with them. For treatment visits, patients cannot have anyone with them due to current Covid guidelines and our immunocompromised population.

## 2021-10-09 ENCOUNTER — Encounter: Payer: Self-pay | Admitting: Hematology & Oncology

## 2021-10-09 ENCOUNTER — Inpatient Hospital Stay: Payer: PRIVATE HEALTH INSURANCE

## 2021-10-09 VITALS — BP 124/56 | HR 79 | Temp 98.1°F | Resp 17

## 2021-10-09 DIAGNOSIS — D46Z Other myelodysplastic syndromes: Secondary | ICD-10-CM

## 2021-10-09 DIAGNOSIS — Z5111 Encounter for antineoplastic chemotherapy: Secondary | ICD-10-CM | POA: Diagnosis not present

## 2021-10-09 MED ORDER — ONDANSETRON HCL 8 MG PO TABS: 8.0000 mg | ORAL_TABLET | Freq: Once | ORAL | Status: DC

## 2021-10-09 MED ORDER — AZACITIDINE CHEMO SQ INJECTION
75.0000 mg/m2 | Freq: Once | INTRAMUSCULAR | Status: AC
  Administered 2021-10-09: 142.5 mg via SUBCUTANEOUS
  Filled 2021-10-09: qty 5.7

## 2021-10-09 NOTE — Patient Instructions (Signed)
Azacitidine suspension for injection (subcutaneous use) What is this medication? AZACITIDINE (ay za SITE i deen) is a chemotherapy drug. This medicine reduces the growth of cancer cells and can suppress the immune system. It is used for treating myelodysplastic syndrome or some types of leukemia. This medicine may be used for other purposes; ask your health care provider or pharmacist if you have questions. COMMON BRAND NAME(S): Vidaza What should I tell my care team before I take this medication? They need to know if you have any of these conditions: kidney disease liver disease liver tumors an unusual or allergic reaction to azacitidine, mannitol, other medicines, foods, dyes, or preservatives pregnant or trying to get pregnant breast-feeding How should I use this medication? This medicine is for injection under the skin. It is administered in a hospital or clinic by a specially trained health care professional. Talk to your pediatrician regarding the use of this medicine in children. While this drug may be prescribed for selected conditions, precautions do apply. Overdosage: If you think you have taken too much of this medicine contact a poison control center or emergency room at once. NOTE: This medicine is only for you. Do not share this medicine with others. What if I miss a dose? It is important not to miss your dose. Call your doctor or health care professional if you are unable to keep an appointment. What may interact with this medication? Interactions have not been studied. Give your health care provider a list of all the medicines, herbs, non-prescription drugs, or dietary supplements you use. Also tell them if you smoke, drink alcohol, or use illegal drugs. Some items may interact with your medicine. This list may not describe all possible interactions. Give your health care provider a list of all the medicines, herbs, non-prescription drugs, or dietary supplements you use. Also  tell them if you smoke, drink alcohol, or use illegal drugs. Some items may interact with your medicine. What should I watch for while using this medication? Visit your doctor for checks on your progress. This drug may make you feel generally unwell. This is not uncommon, as chemotherapy can affect healthy cells as well as cancer cells. Report any side effects. Continue your course of treatment even though you feel ill unless your doctor tells you to stop. In some cases, you may be given additional medicines to help with side effects. Follow all directions for their use. Call your doctor or health care professional for advice if you get a fever, chills or sore throat, or other symptoms of a cold or flu. Do not treat yourself. This drug decreases your body's ability to fight infections. Try to avoid being around people who are sick. This medicine may increase your risk to bruise or bleed. Call your doctor or health care professional if you notice any unusual bleeding. You may need blood work done while you are taking this medicine. Do not become pregnant while taking this medicine and for 6 months after the last dose. Women should inform their doctor if they wish to become pregnant or think they might be pregnant. Men should not father a child while taking this medicine and for 3 months after the last dose. There is a potential for serious side effects to an unborn child. Talk to your health care professional or pharmacist for more information. Do not breast-feed an infant while taking this medicine and for 1 week after the last dose. This medicine may interfere with the ability to have a child. Talk   with your doctor or health care professional if you are concerned about your fertility. What side effects may I notice from receiving this medication? Side effects that you should report to your doctor or health care professional as soon as possible: allergic reactions like skin rash, itching or hives,  swelling of the face, lips, or tongue low blood counts - this medicine may decrease the number of white blood cells, red blood cells and platelets. You may be at increased risk for infections and bleeding. signs of infection - fever or chills, cough, sore throat, pain passing urine signs of decreased platelets or bleeding - bruising, pinpoint red spots on the skin, black, tarry stools, blood in the urine signs of decreased red blood cells - unusually weak or tired, fainting spells, lightheadedness signs and symptoms of kidney injury like trouble passing urine or change in the amount of urine signs and symptoms of liver injury like dark yellow or brown urine; general ill feeling or flu-like symptoms; light-colored stools; loss of appetite; nausea; right upper belly pain; unusually weak or tired; yellowing of the eyes or skin Side effects that usually do not require medical attention (report to your doctor or health care professional if they continue or are bothersome): constipation diarrhea nausea, vomiting pain or redness at the injection site unusually weak or tired This list may not describe all possible side effects. Call your doctor for medical advice about side effects. You may report side effects to FDA at 1-800-FDA-1088. Where should I keep my medication? This drug is given in a hospital or clinic and will not be stored at home. NOTE: This sheet is a summary. It may not cover all possible information. If you have questions about this medicine, talk to your doctor, pharmacist, or health care provider.  2022 Elsevier/Gold Standard (2016-12-16 00:00:00)

## 2021-10-10 ENCOUNTER — Inpatient Hospital Stay: Payer: PRIVATE HEALTH INSURANCE

## 2021-10-10 ENCOUNTER — Other Ambulatory Visit: Payer: Self-pay

## 2021-10-10 VITALS — BP 107/52 | HR 79 | Temp 98.0°F | Resp 16

## 2021-10-10 DIAGNOSIS — D46Z Other myelodysplastic syndromes: Secondary | ICD-10-CM

## 2021-10-10 DIAGNOSIS — Z5111 Encounter for antineoplastic chemotherapy: Secondary | ICD-10-CM | POA: Diagnosis not present

## 2021-10-10 MED ORDER — ONDANSETRON HCL 8 MG PO TABS: 8.0000 mg | ORAL_TABLET | Freq: Once | ORAL | Status: DC

## 2021-10-10 MED ORDER — AZACITIDINE CHEMO SQ INJECTION
75.0000 mg/m2 | Freq: Once | INTRAMUSCULAR | Status: AC
  Administered 2021-10-10: 142.5 mg via SUBCUTANEOUS
  Filled 2021-10-10: qty 5.7

## 2021-10-10 NOTE — Patient Instructions (Signed)
Azacitidine suspension for injection (subcutaneous use) What is this medication? AZACITIDINE (ay za SITE i deen) is a chemotherapy drug. This medicine reduces the growth of cancer cells and can suppress the immune system. It is used for treating myelodysplastic syndrome or some types of leukemia. This medicine may be used for other purposes; ask your health care provider or pharmacist if you have questions. COMMON BRAND NAME(S): Vidaza What should I tell my care team before I take this medication? They need to know if you have any of these conditions: kidney disease liver disease liver tumors an unusual or allergic reaction to azacitidine, mannitol, other medicines, foods, dyes, or preservatives pregnant or trying to get pregnant breast-feeding How should I use this medication? This medicine is for injection under the skin. It is administered in a hospital or clinic by a specially trained health care professional. Talk to your pediatrician regarding the use of this medicine in children. While this drug may be prescribed for selected conditions, precautions do apply. Overdosage: If you think you have taken too much of this medicine contact a poison control center or emergency room at once. NOTE: This medicine is only for you. Do not share this medicine with others. What if I miss a dose? It is important not to miss your dose. Call your doctor or health care professional if you are unable to keep an appointment. What may interact with this medication? Interactions have not been studied. Give your health care provider a list of all the medicines, herbs, non-prescription drugs, or dietary supplements you use. Also tell them if you smoke, drink alcohol, or use illegal drugs. Some items may interact with your medicine. This list may not describe all possible interactions. Give your health care provider a list of all the medicines, herbs, non-prescription drugs, or dietary supplements you use. Also  tell them if you smoke, drink alcohol, or use illegal drugs. Some items may interact with your medicine. What should I watch for while using this medication? Visit your doctor for checks on your progress. This drug may make you feel generally unwell. This is not uncommon, as chemotherapy can affect healthy cells as well as cancer cells. Report any side effects. Continue your course of treatment even though you feel ill unless your doctor tells you to stop. In some cases, you may be given additional medicines to help with side effects. Follow all directions for their use. Call your doctor or health care professional for advice if you get a fever, chills or sore throat, or other symptoms of a cold or flu. Do not treat yourself. This drug decreases your body's ability to fight infections. Try to avoid being around people who are sick. This medicine may increase your risk to bruise or bleed. Call your doctor or health care professional if you notice any unusual bleeding. You may need blood work done while you are taking this medicine. Do not become pregnant while taking this medicine and for 6 months after the last dose. Women should inform their doctor if they wish to become pregnant or think they might be pregnant. Men should not father a child while taking this medicine and for 3 months after the last dose. There is a potential for serious side effects to an unborn child. Talk to your health care professional or pharmacist for more information. Do not breast-feed an infant while taking this medicine and for 1 week after the last dose. This medicine may interfere with the ability to have a child. Talk   with your doctor or health care professional if you are concerned about your fertility. What side effects may I notice from receiving this medication? Side effects that you should report to your doctor or health care professional as soon as possible: allergic reactions like skin rash, itching or hives,  swelling of the face, lips, or tongue low blood counts - this medicine may decrease the number of white blood cells, red blood cells and platelets. You may be at increased risk for infections and bleeding. signs of infection - fever or chills, cough, sore throat, pain passing urine signs of decreased platelets or bleeding - bruising, pinpoint red spots on the skin, black, tarry stools, blood in the urine signs of decreased red blood cells - unusually weak or tired, fainting spells, lightheadedness signs and symptoms of kidney injury like trouble passing urine or change in the amount of urine signs and symptoms of liver injury like dark yellow or brown urine; general ill feeling or flu-like symptoms; light-colored stools; loss of appetite; nausea; right upper belly pain; unusually weak or tired; yellowing of the eyes or skin Side effects that usually do not require medical attention (report to your doctor or health care professional if they continue or are bothersome): constipation diarrhea nausea, vomiting pain or redness at the injection site unusually weak or tired This list may not describe all possible side effects. Call your doctor for medical advice about side effects. You may report side effects to FDA at 1-800-FDA-1088. Where should I keep my medication? This drug is given in a hospital or clinic and will not be stored at home. NOTE: This sheet is a summary. It may not cover all possible information. If you have questions about this medicine, talk to your doctor, pharmacist, or health care provider.  2022 Elsevier/Gold Standard (2016-12-16 00:00:00)

## 2021-10-13 ENCOUNTER — Inpatient Hospital Stay: Payer: PRIVATE HEALTH INSURANCE

## 2021-10-13 ENCOUNTER — Other Ambulatory Visit: Payer: Self-pay

## 2021-10-13 VITALS — BP 109/48 | HR 84 | Temp 98.3°F | Resp 17

## 2021-10-13 DIAGNOSIS — D46Z Other myelodysplastic syndromes: Secondary | ICD-10-CM

## 2021-10-13 DIAGNOSIS — Z5111 Encounter for antineoplastic chemotherapy: Secondary | ICD-10-CM | POA: Diagnosis not present

## 2021-10-13 LAB — CMP (CANCER CENTER ONLY)
ALT: 14 U/L (ref 0–44)
AST: 17 U/L (ref 15–41)
Albumin: 4 g/dL (ref 3.5–5.0)
Alkaline Phosphatase: 110 U/L (ref 38–126)
Anion gap: 9 (ref 5–15)
BUN: 26 mg/dL — ABNORMAL HIGH (ref 6–20)
CO2: 26 mmol/L (ref 22–32)
Calcium: 10 mg/dL (ref 8.9–10.3)
Chloride: 100 mmol/L (ref 98–111)
Creatinine: 0.87 mg/dL (ref 0.61–1.24)
GFR, Estimated: >60 mL/min (ref >60)
Glucose, Bld: 117 mg/dL — ABNORMAL HIGH (ref 70–99)
Potassium: 3.9 mmol/L (ref 3.5–5.1)
Sodium: 135 mmol/L (ref 135–145)
Total Bilirubin: 0.4 mg/dL (ref 0.3–1.2)
Total Protein: 7.3 g/dL (ref 6.5–8.1)

## 2021-10-13 LAB — CBC WITH DIFFERENTIAL (CANCER CENTER ONLY)
Abs Immature Granulocytes: 0 10*3/uL (ref 0.00–0.07)
Basophils Absolute: 0 10*3/uL (ref 0.0–0.1)
Basophils Relative: 0 %
Eosinophils Absolute: 0 10*3/uL (ref 0.0–0.5)
Eosinophils Relative: 1 %
HCT: 26.1 % — ABNORMAL LOW (ref 39.0–52.0)
Hemoglobin: 9.1 g/dL — ABNORMAL LOW (ref 13.0–17.0)
Immature Granulocytes: 0 %
Lymphocytes Relative: 80 %
Lymphs Abs: 1.8 10*3/uL (ref 0.7–4.0)
MCH: 35.3 pg — ABNORMAL HIGH (ref 26.0–34.0)
MCHC: 34.9 g/dL (ref 30.0–36.0)
MCV: 101.2 fL — ABNORMAL HIGH (ref 80.0–100.0)
Monocytes Absolute: 0.2 10*3/uL (ref 0.1–1.0)
Monocytes Relative: 7 %
Neutro Abs: 0.3 10*3/uL — CL (ref 1.7–7.7)
Neutrophils Relative %: 12 %
Platelet Count: 35 10*3/uL — ABNORMAL LOW (ref 150–400)
RBC: 2.52 MIL/uL — ABNORMAL LOW (ref 4.22–5.81)
RDW: 14.1 % (ref 11.5–15.5)
WBC Count: 2.3 10*3/uL — ABNORMAL LOW (ref 4.0–10.5)
nRBC: 0.9 % — ABNORMAL HIGH (ref 0.0–0.2)

## 2021-10-13 MED ORDER — AZACITIDINE CHEMO SQ INJECTION: 75.0000 mg/m2 | Freq: Once | INTRAMUSCULAR | Status: DC

## 2021-10-13 MED ORDER — ONDANSETRON HCL 8 MG PO TABS: 8.0000 mg | ORAL_TABLET | Freq: Once | ORAL | Status: DC

## 2021-10-13 NOTE — Progress Notes (Signed)
Hold treatment today and tomorrow per order of Dr. Marin Olp.  Pt notified and verbalizes an understanding to return on Monday, 10/20/21 at 3:15PM for labs only.

## 2021-10-14 ENCOUNTER — Inpatient Hospital Stay: Payer: PRIVATE HEALTH INSURANCE

## 2021-10-14 ENCOUNTER — Ambulatory Visit: Payer: PRIVATE HEALTH INSURANCE

## 2021-10-20 ENCOUNTER — Other Ambulatory Visit: Payer: Self-pay

## 2021-10-20 ENCOUNTER — Encounter: Payer: Self-pay | Admitting: Hematology & Oncology

## 2021-10-20 ENCOUNTER — Inpatient Hospital Stay (HOSPITAL_BASED_OUTPATIENT_CLINIC_OR_DEPARTMENT_OTHER): Payer: PRIVATE HEALTH INSURANCE | Admitting: Hematology & Oncology

## 2021-10-20 ENCOUNTER — Inpatient Hospital Stay: Payer: PRIVATE HEALTH INSURANCE

## 2021-10-20 VITALS — BP 112/66 | HR 102 | Temp 100.2°F | Resp 18 | Wt 160.0 lb

## 2021-10-20 DIAGNOSIS — Z5111 Encounter for antineoplastic chemotherapy: Secondary | ICD-10-CM | POA: Diagnosis not present

## 2021-10-20 DIAGNOSIS — D46Z Other myelodysplastic syndromes: Secondary | ICD-10-CM

## 2021-10-20 LAB — CMP (CANCER CENTER ONLY)
ALT: 16 U/L (ref 0–44)
AST: 19 U/L (ref 15–41)
Albumin: 4.2 g/dL (ref 3.5–5.0)
Alkaline Phosphatase: 111 U/L (ref 38–126)
Anion gap: 6 (ref 5–15)
BUN: 21 mg/dL — ABNORMAL HIGH (ref 6–20)
CO2: 28 mmol/L (ref 22–32)
Calcium: 10 mg/dL (ref 8.9–10.3)
Chloride: 100 mmol/L (ref 98–111)
Creatinine: 0.8 mg/dL (ref 0.61–1.24)
GFR, Estimated: >60 mL/min (ref >60)
Glucose, Bld: 122 mg/dL — ABNORMAL HIGH (ref 70–99)
Potassium: 4 mmol/L (ref 3.5–5.1)
Sodium: 134 mmol/L — ABNORMAL LOW (ref 135–145)
Total Bilirubin: 0.4 mg/dL (ref 0.3–1.2)
Total Protein: 7.7 g/dL (ref 6.5–8.1)

## 2021-10-20 LAB — CBC WITH DIFFERENTIAL (CANCER CENTER ONLY)
Abs Immature Granulocytes: 0.01 10*3/uL (ref 0.00–0.07)
Basophils Absolute: 0 10*3/uL (ref 0.0–0.1)
Basophils Relative: 0 %
Eosinophils Absolute: 0 10*3/uL (ref 0.0–0.5)
Eosinophils Relative: 1 %
HCT: 23.2 % — ABNORMAL LOW (ref 39.0–52.0)
Hemoglobin: 8.1 g/dL — ABNORMAL LOW (ref 13.0–17.0)
Immature Granulocytes: 1 %
Lymphocytes Relative: 63 %
Lymphs Abs: 1.2 10*3/uL (ref 0.7–4.0)
MCH: 35.4 pg — ABNORMAL HIGH (ref 26.0–34.0)
MCHC: 34.9 g/dL (ref 30.0–36.0)
MCV: 101.3 fL — ABNORMAL HIGH (ref 80.0–100.0)
Monocytes Absolute: 0.3 10*3/uL (ref 0.1–1.0)
Monocytes Relative: 17 %
Neutro Abs: 0.3 10*3/uL — CL (ref 1.7–7.7)
Neutrophils Relative %: 18 %
Platelet Count: 17 10*3/uL — ABNORMAL LOW (ref 150–400)
RBC: 2.29 MIL/uL — ABNORMAL LOW (ref 4.22–5.81)
RDW: 14.8 % (ref 11.5–15.5)
WBC Count: 2 10*3/uL — ABNORMAL LOW (ref 4.0–10.5)
nRBC: 2 % — ABNORMAL HIGH (ref 0.0–0.2)

## 2021-10-20 NOTE — Progress Notes (Signed)
Hematology and Oncology Follow Up Visit  Thomas Frazier [id number] 1966/10/24 55 y.o. 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  He had been feeling well.  He came in just for lab work.  He had a temperature of 100.8.  His absolute neutrophil count is 300.  His hemoglobin is 8.1.  Platelet count is 17,000.  He just has been weak.  He does not have a lot of energy.  He has had no rashes.  He has had no obvious bleeding.  There has been no diarrhea.  He has had a little bit of a cough.  It is not productive.  He has had no mouth sores.  Again, I worry about him being neutropenic.  Currently, I would say his performance status is probably ECOG 1.     Medications:  Allergies as of 10/20/2021       Reactions   Ciprofloxacin Rash   Diflucan [fluconazole] Rash   Famvir [famciclovir] Rash        Medication List        Accurate as of October 20, 2021  4:43 PM. If you have any questions, ask your nurse or doctor.          azaCITIDine 100 MG Susr Commonly known as: VIDAZA Vidaza 142.5 mg (rounded from 143.25 mg)=75 mg/m2 x 1.91 m2 daily times five days every twenty eight days   carvedilol 3.125 MG tablet Commonly known as: COREG Take 1 tablet (3.125 mg total) by mouth 2 (two) times daily with a meal.   digoxin 0.25 MG tablet Commonly known as: LANOXIN Take 1 tablet (0.25 mg total) by mouth daily.   ibuprofen 200 MG tablet Commonly known as: ADVIL Take by mouth. Once Daily   Melatonin 10 MG Tabs Take 10 mg by mouth at bedtime as needed.   MULTIVITAMIN/IRON PO Take 1 tablet by mouth daily.   prochlorperazine 10 MG  tablet Commonly known as: COMPAZINE Take 1 tablet (10 mg total) by mouth every 6 (six) hours as needed (Nausea or vomiting). What changed: reasons to take this   sacubitril-valsartan 24-26 MG Commonly known as: ENTRESTO Take 0.5 tablets by mouth 2 (two) times daily.   traMADol 50 MG tablet Commonly known as: ULTRAM Take 2 tablets (100 mg total) by mouth every 6 (six) hours as needed. 1-2 tablets (50mg - 100mg ) by mouth every 6 hours as needed        Allergies:  Allergies  Allergen Reactions   Ciprofloxacin Rash   Diflucan [Fluconazole] Rash   Famvir [Famciclovir] Rash    Past Medical History, Surgical history, Social history, and Family History were reviewed and updated.  Review of Systems: Review of Systems  Constitutional:  Positive for malaise/fatigue. Negative for diaphoresis.  HENT: Negative.    Eyes: Negative.   Respiratory:  Positive for shortness of breath.   Cardiovascular: Negative.   Gastrointestinal: Negative.   Genitourinary: Negative.   Musculoskeletal:  Positive for joint pain.  Skin: Negative.   Neurological: Negative.   Endo/Heme/Allergies: Negative.   Psychiatric/Behavioral: Negative.      Physical Exam:  weight is 160 lb (72.6 kg). His oral  temperature is 100.2 F (37.9 C). His blood pressure is 112/66 and his pulse is 102 (abnormal). His respiration is 18 and oxygen saturation is 100%.   Wt Readings from Last 3 Encounters:  10/20/21 160 lb (72.6 kg)  10/06/21 161 lb (73 kg)  09/09/21 162 lb 8 oz (73.7 kg)    Physical Exam Vitals reviewed.  HENT:     Head: Normocephalic and atraumatic.  Eyes:     Pupils: Pupils are equal, round, and reactive to light.  Cardiovascular:     Rate and Rhythm: Normal rate and regular rhythm.     Heart sounds: Normal heart sounds.     Comments: Cardiac exam shows a regular rate and rhythm.  He has a 3/6 systolic ejection murmur consistent with aortic stenosis. Pulmonary:     Effort: Pulmonary effort is  normal.     Breath sounds: Normal breath sounds.  Abdominal:     General: Bowel sounds are normal.     Palpations: Abdomen is soft.  Musculoskeletal:        General: No tenderness or deformity. Normal range of motion.     Cervical back: Normal range of motion.  Lymphadenopathy:     Cervical: No cervical adenopathy.  Skin:    General: Skin is warm and dry.     Findings: No erythema or rash.  Neurological:     Mental Status: He is alert and oriented to person, place, and time.  Psychiatric:        Behavior: Behavior normal.        Thought Content: Thought content normal.        Judgment: Judgment normal.    Lab Results  Component Value Date   WBC 2.0 (L) 10/20/2021   HGB 8.1 (L) 10/20/2021   HCT 23.2 (L) 10/20/2021   MCV 101.3 (H) 10/20/2021   PLT 17 (L) 10/20/2021   Lab Results  Component Value Date   FERRITIN 1,257 (H) 10/06/2021   IRON 84 10/06/2021   TIBC 209 10/06/2021   UIBC 125 10/06/2021   IRONPCTSAT 40 10/06/2021   Lab Results  Component Value Date   RETICCTPCT 1.3 10/06/2021   RBC 2.29 (L) 10/20/2021   No results found for: KPAFRELGTCHN, LAMBDASER, KAPLAMBRATIO No results found for: IGGSERUM, IGA, IGMSERUM No results found for: Ronnald Ramp, A1GS, A2GS, Violet Baldy, MSPIKE, SPEI   Chemistry      Component Value Date/Time   NA 134 (L) 10/20/2021 1513   K 4.0 10/20/2021 1513   CL 100 10/20/2021 1513   CO2 28 10/20/2021 1513   BUN 21 (H) 10/20/2021 1513   CREATININE 0.80 10/20/2021 1513      Component Value Date/Time   CALCIUM 10.0 10/20/2021 1513   ALKPHOS 111 10/20/2021 1513   AST 19 10/20/2021 1513   ALT 16 10/20/2021 1513   BILITOT 0.4 10/20/2021 1513       Impression and Plan: Thomas Frazier is a very pleasant 55 yo Hispanic gentleman with high grade myelodysplasia, complex cytogenetics.   He just completed his eighth cycle of treatment with Vidaza.  Again, I just worried that he may have to be in the hospital.  I worry  about him with neutropenia in the fever.  He does have a cardiac issues.  He does have significant aortic stenosis.    I will have to see if we get him over to [facility]  Sure they will be able to take them or not.  I just think that since he  does have the significant bone marrow issues, and that they know him well, it may not be a bad idea to get him over there for inpatient therapy.  We will have to see how we can manage all of this.  We will try to call one of the doctors over at [facility] to let them know what is going on.    [name], MD 11/21/20224:43 PM

## 2021-10-27 ENCOUNTER — Telehealth: Payer: Self-pay | Admitting: *Deleted

## 2021-10-27 ENCOUNTER — Inpatient Hospital Stay: Payer: PRIVATE HEALTH INSURANCE

## 2021-10-27 ENCOUNTER — Other Ambulatory Visit: Payer: Self-pay

## 2021-10-27 DIAGNOSIS — Z5111 Encounter for antineoplastic chemotherapy: Secondary | ICD-10-CM | POA: Diagnosis not present

## 2021-10-27 DIAGNOSIS — D46Z Other myelodysplastic syndromes: Secondary | ICD-10-CM

## 2021-10-27 LAB — CBC WITH DIFFERENTIAL (CANCER CENTER ONLY)
Abs Immature Granulocytes: 0 10*3/uL (ref 0.00–0.07)
Basophils Absolute: 0 10*3/uL (ref 0.0–0.1)
Basophils Relative: 0 %
Eosinophils Absolute: 0 10*3/uL (ref 0.0–0.5)
Eosinophils Relative: 0 %
HCT: 26.8 % — ABNORMAL LOW (ref 39.0–52.0)
Hemoglobin: 9.1 g/dL — ABNORMAL LOW (ref 13.0–17.0)
Immature Granulocytes: 0 %
Lymphocytes Relative: 82 %
Lymphs Abs: 2 10*3/uL (ref 0.7–4.0)
MCH: 33.8 pg (ref 26.0–34.0)
MCHC: 34 g/dL (ref 30.0–36.0)
MCV: 99.6 fL (ref 80.0–100.0)
Monocytes Absolute: 0.3 10*3/uL (ref 0.1–1.0)
Monocytes Relative: 11 %
Neutro Abs: 0.2 10*3/uL — CL (ref 1.7–7.7)
Neutrophils Relative %: 7 %
Platelet Count: 19 10*3/uL — ABNORMAL LOW (ref 150–400)
RBC: 2.69 MIL/uL — ABNORMAL LOW (ref 4.22–5.81)
RDW: 15.5 % (ref 11.5–15.5)
WBC Count: 2.5 10*3/uL — ABNORMAL LOW (ref 4.0–10.5)
nRBC: 1.6 % — ABNORMAL HIGH (ref 0.0–0.2)

## 2021-10-27 LAB — CMP (CANCER CENTER ONLY)
ALT: 15 U/L (ref 0–44)
AST: 18 U/L (ref 15–41)
Albumin: 4 g/dL (ref 3.5–5.0)
Alkaline Phosphatase: 121 U/L (ref 38–126)
Anion gap: 7 (ref 5–15)
BUN: 22 mg/dL — ABNORMAL HIGH (ref 6–20)
CO2: 25 mmol/L (ref 22–32)
Calcium: 10 mg/dL (ref 8.9–10.3)
Chloride: 103 mmol/L (ref 98–111)
Creatinine: 0.79 mg/dL (ref 0.61–1.24)
GFR, Estimated: >60 mL/min (ref >60)
Glucose, Bld: 113 mg/dL — ABNORMAL HIGH (ref 70–99)
Potassium: 3.6 mmol/L (ref 3.5–5.1)
Sodium: 135 mmol/L (ref 135–145)
Total Bilirubin: 0.5 mg/dL (ref 0.3–1.2)
Total Protein: 7.8 g/dL (ref 6.5–8.1)

## 2021-10-27 NOTE — Telephone Encounter (Signed)
Critical ANC of .2 called by Richardson Landry in lab.  Dr Marin Olp notified.  No orders received

## 2021-10-27 NOTE — Telephone Encounter (Signed)
Reviewed lab work with Patient. Took patients temperature which was 97.8.  Dr Marin Olp notified.  Wants patient to get blood work repeated on Thursday.  Patient aware and appt made

## 2021-10-30 ENCOUNTER — Inpatient Hospital Stay: Payer: PRIVATE HEALTH INSURANCE

## 2021-10-31 ENCOUNTER — Other Ambulatory Visit: Payer: Self-pay

## 2021-10-31 MED ORDER — SACUBITRIL-VALSARTAN 24-26 MG PO TABS: 1.0000 | ORAL_TABLET | Freq: Two times a day (BID) | ORAL | 3 refills | Status: AC

## 2021-10-31 MED ORDER — SACUBITRIL-VALSARTAN 24-26 MG PO TABS: 1.0000 | ORAL_TABLET | Freq: Two times a day (BID) | ORAL | 3 refills | Status: DC

## 2021-11-03 ENCOUNTER — Inpatient Hospital Stay: Payer: PRIVATE HEALTH INSURANCE

## 2021-11-03 ENCOUNTER — Other Ambulatory Visit: Payer: PRIVATE HEALTH INSURANCE

## 2021-11-04 ENCOUNTER — Inpatient Hospital Stay: Payer: PRIVATE HEALTH INSURANCE

## 2021-11-04 ENCOUNTER — Inpatient Hospital Stay: Payer: PRIVATE HEALTH INSURANCE | Admitting: Hematology & Oncology

## 2021-11-05 ENCOUNTER — Inpatient Hospital Stay: Payer: PRIVATE HEALTH INSURANCE

## 2021-11-06 ENCOUNTER — Inpatient Hospital Stay: Payer: PRIVATE HEALTH INSURANCE

## 2021-11-07 ENCOUNTER — Ambulatory Visit: Payer: PRIVATE HEALTH INSURANCE

## 2021-11-10 ENCOUNTER — Inpatient Hospital Stay: Payer: PRIVATE HEALTH INSURANCE

## 2021-11-10 ENCOUNTER — Other Ambulatory Visit: Payer: PRIVATE HEALTH INSURANCE

## 2021-11-10 ENCOUNTER — Telehealth: Payer: Self-pay | Admitting: *Deleted

## 2021-11-10 NOTE — Telephone Encounter (Signed)
Call received from Freada Bergeron, [place] Nurse Navigator with [facility] to notify Dr. Marin Olp that pt remains hospitalized at [facility].  Dr. Marin Olp notified and appts canceled for today and tomorrow.

## 2021-11-11 ENCOUNTER — Inpatient Hospital Stay: Payer: PRIVATE HEALTH INSURANCE

## 2021-11-13 ENCOUNTER — Telehealth: Payer: Self-pay | Admitting: Hematology & Oncology

## 2021-11-13 ENCOUNTER — Other Ambulatory Visit: Payer: Self-pay | Admitting: *Deleted

## 2021-11-13 DIAGNOSIS — D696 Thrombocytopenia, unspecified: Secondary | ICD-10-CM

## 2021-11-13 DIAGNOSIS — D649 Anemia, unspecified: Secondary | ICD-10-CM

## 2021-11-13 DIAGNOSIS — D46Z Other myelodysplastic syndromes: Secondary | ICD-10-CM

## 2021-11-14 ENCOUNTER — Encounter: Payer: Self-pay | Admitting: Hematology & Oncology

## 2021-11-14 ENCOUNTER — Other Ambulatory Visit: Payer: Self-pay

## 2021-11-14 ENCOUNTER — Other Ambulatory Visit: Payer: Self-pay | Admitting: *Deleted

## 2021-11-14 ENCOUNTER — Other Ambulatory Visit: Payer: Self-pay | Admitting: Family

## 2021-11-14 ENCOUNTER — Telehealth: Payer: Self-pay | Admitting: *Deleted

## 2021-11-14 ENCOUNTER — Inpatient Hospital Stay: Payer: PRIVATE HEALTH INSURANCE | Attending: Family

## 2021-11-14 ENCOUNTER — Inpatient Hospital Stay: Payer: PRIVATE HEALTH INSURANCE

## 2021-11-14 DIAGNOSIS — D696 Thrombocytopenia, unspecified: Secondary | ICD-10-CM

## 2021-11-14 DIAGNOSIS — D649 Anemia, unspecified: Secondary | ICD-10-CM

## 2021-11-14 DIAGNOSIS — D464 Refractory anemia, unspecified: Secondary | ICD-10-CM | POA: Diagnosis not present

## 2021-11-14 DIAGNOSIS — D46Z Other myelodysplastic syndromes: Secondary | ICD-10-CM

## 2021-11-14 LAB — CBC WITH DIFFERENTIAL (CANCER CENTER ONLY)
Abs Immature Granulocytes: 0 10*3/uL (ref 0.00–0.07)
Basophils Absolute: 0 10*3/uL (ref 0.0–0.1)
Basophils Relative: 0 %
Eosinophils Absolute: 0 10*3/uL (ref 0.0–0.5)
Eosinophils Relative: 1 %
HCT: 30 % — ABNORMAL LOW (ref 39.0–52.0)
Hemoglobin: 10.3 g/dL — ABNORMAL LOW (ref 13.0–17.0)
Lymphocytes Relative: 88 %
Lymphs Abs: 1.9 10*3/uL (ref 0.7–4.0)
MCH: 32.3 pg (ref 26.0–34.0)
MCHC: 34.3 g/dL (ref 30.0–36.0)
MCV: 94 fL (ref 80.0–100.0)
Monocytes Absolute: 0.2 10*3/uL (ref 0.1–1.0)
Monocytes Relative: 8 %
Neutro Abs: 0.1 10*3/uL — CL (ref 1.7–7.7)
Neutrophils Relative %: 3 %
Platelet Count: 23 10*3/uL — ABNORMAL LOW (ref 150–400)
RBC: 3.19 MIL/uL — ABNORMAL LOW (ref 4.22–5.81)
RDW: 15 % (ref 11.5–15.5)
Smear Review: NORMAL
WBC Count: 2.2 10*3/uL — ABNORMAL LOW (ref 4.0–10.5)
nRBC: 3.6 % — ABNORMAL HIGH (ref 0.0–0.2)

## 2021-11-14 LAB — CMP (CANCER CENTER ONLY)
ALT: 14 U/L (ref 0–44)
AST: 16 U/L (ref 15–41)
Albumin: 3.9 g/dL (ref 3.5–5.0)
Alkaline Phosphatase: 93 U/L (ref 38–126)
Anion gap: 7 (ref 5–15)
BUN: 25 mg/dL — ABNORMAL HIGH (ref 6–20)
CO2: 27 mmol/L (ref 22–32)
Calcium: 10.8 mg/dL — ABNORMAL HIGH (ref 8.9–10.3)
Chloride: 100 mmol/L (ref 98–111)
Creatinine: 0.86 mg/dL (ref 0.61–1.24)
GFR, Estimated: >60 mL/min (ref >60)
Glucose, Bld: 109 mg/dL — ABNORMAL HIGH (ref 70–99)
Potassium: 3.9 mmol/L (ref 3.5–5.1)
Sodium: 134 mmol/L — ABNORMAL LOW (ref 135–145)
Total Bilirubin: 0.5 mg/dL (ref 0.3–1.2)
Total Protein: 8.9 g/dL — ABNORMAL HIGH (ref 6.5–8.1)

## 2021-11-14 LAB — SAMPLE TO BLOOD BANK

## 2021-11-14 LAB — MAGNESIUM: Magnesium: 1.9 mg/dL (ref 1.7–2.4)

## 2021-11-14 MED ORDER — SODIUM CHLORIDE 0.9% IV SOLUTION
250.0000 mL | Freq: Once | INTRAVENOUS | Status: AC
  Administered 2021-11-14: 250 mL via INTRAVENOUS

## 2021-11-14 MED ORDER — ACETAMINOPHEN 325 MG PO TABS
650.0000 mg | ORAL_TABLET | Freq: Once | ORAL | Status: AC
  Administered 2021-11-14: 650 mg via ORAL
  Filled 2021-11-14: qty 2

## 2021-11-14 MED ORDER — DIPHENHYDRAMINE HCL 25 MG PO CAPS
25.0000 mg | ORAL_CAPSULE | Freq: Once | ORAL | Status: AC
  Administered 2021-11-14: 25 mg via ORAL
  Filled 2021-11-14: qty 1

## 2021-11-14 NOTE — Telephone Encounter (Signed)
Bertram Gala RN at [facility] notified that today's lab results have been faxed to [phone] and that pt will be receiving platelets today d/t pt.'s complaints of intermittent nose bleeds.

## 2021-11-14 NOTE — Telephone Encounter (Signed)
Vale Haven NP notified of ANC-0.1.  No new orders received at this time.  Today's lab results faxed to [facility] at fax# [phone].

## 2021-11-14 NOTE — Patient Instructions (Signed)
Platelet Transfusion ?A platelet transfusion is a procedure in which a person receives donated platelets through an IV. Platelets are parts of blood that stick together and form a clot to help the body stop bleeding after an injury. If you have too few platelets, your blood may have trouble clotting. This may cause you to bleed and bruise very easily. ?You may need a platelet transfusion if you have a condition that causes a low number of platelets (thrombocytopenia). A platelet transfusion may be used to stop or prevent excessive bleeding. ?Tell a health care provider about: ?Any reactions you have had during previous transfusions. ?Any allergies you have. ?All medicines you are taking, including vitamins, herbs, eye drops, creams, and over-the-counter medicines. ?Any bleeding problems you have. ?Any surgeries you have had. ?Any medical conditions you have. ?Whether you are pregnant or may be pregnant. ?What are the risks? ?Generally, this is a safe procedure. However, problems may occur, including: ?Fever. ?Infection. ?Allergic reaction to the donated (donor) platelets. ?Your body's disease-fighting system (immune system) attacking the donor platelets (hemolytic reaction). This is rare. ?A rare reaction that causes lung damage (transfusion-related acute lung injury). ?What happens before the procedure? ?Medicines ?Ask your health care provider about: ?Changing or stopping your regular medicines. This is especially important if you are taking diabetes medicines or blood thinners. ?Taking medicines such as aspirin and ibuprofen. These medicines can thin your blood. Do not take these medicines unless your health care provider tells you to take them. ?Taking over-the-counter medicines, vitamins, herbs, and supplements. ?General instructions ?You will have a blood test to determine your blood type. Your blood type determines what kind of platelets you will be given. ?Follow instructions from your health care provider  about eating or drinking restrictions. ?If you have had an allergic reaction to a transfusion in the past, you may be given medicine to help prevent a reaction. ?Your temperature, blood pressure, pulse, and breathing will be monitored. ?What happens during the procedure? ? ?An IV will be inserted into one of your veins. ?For your safety, two health care providers will verify your identity along with the donor platelets about to be infused. ?A bag of donor platelets will be connected to your IV. The platelets will flow into your bloodstream. This usually takes 30-60 minutes. ?Your temperature, blood pressure, pulse, and breathing will be monitored during the transfusion. This helps detect early signs of any reaction. ?You will also be monitored for other symptoms that may indicate a reaction, including chills, hives, or itching. ?If you have signs of a reaction at any time, your transfusion will be stopped, and you may be given medicine to help manage the reaction. ?When your transfusion is complete, your IV will be removed. ?Pressure may be applied to the IV site for a few minutes to stop any bleeding. ?The IV site will be covered with a bandage (dressing). ?The procedure may vary among health care providers and hospitals. ?What can I expect after the procedure? ?Your blood pressure, temperature, pulse, and breathing will be monitored until you leave the hospital or clinic. ?You may have some bruising and soreness at your IV site. ?Follow these instructions at home: ?Medicines ?Take over-the-counter and prescription medicines only as told by your health care provider. ?Talk with your health care provider before you take any medicines that contain aspirin or NSAIDs, such as ibuprofen. These medicines increase your risk for dangerous bleeding. ?IV site care ?Check your IV site every day for signs of infection. Check for: ?  Redness, swelling, or pain. ?Fluid or blood. If fluid or blood drains from your IV site, use your  hands to press down firmly on a bandage covering the area for a minute or two. Doing this should stop the bleeding. ?Warmth. ?Pus or a bad smell. ?General instructions ?Change or remove your dressing as told by your health care provider. ?Return to your normal activities as told by your health care provider. Ask your health care provider what activities are safe for you. ?Do not take baths, swim, or use a hot tub until your health care provider approves. Ask your health care provider if you may take showers. ?Keep all follow-up visits. This is important. ?Contact a health care provider if: ?You have a headache that does not go away with medicine. ?You have hives, rash, or itchy skin. ?You have nausea or vomiting. ?You feel unusually tired or weak. ?You have signs of infection at your IV site. ?Get help right away if: ?You have a fever or chills. ?You urinate less often than usual. ?Your urine is darker colored than normal. ?You have any of the following: ?Trouble breathing. ?Pain in your back, abdomen, or chest. ?Cool, clammy skin. ?A fast heartbeat. ?Summary ?Platelets are tiny pieces of blood cells that clump together to form a blood clot when you have an injury. If you have too few platelets, your blood may have trouble clotting. ?A platelet transfusion is a procedure in which you receive donated platelets through an IV. ?A platelet transfusion may be used to stop or prevent excessive bleeding. ?After the procedure, check your IV site every day for signs of infection. ?This information is not intended to replace advice given to you by your health care provider. Make sure you discuss any questions you have with your health care provider. ?Document Revised: 05/22/2021 Document Reviewed: 05/22/2021 ?Elsevier Patient Education ? 2022 Elsevier Inc. ? ?

## 2021-11-17 ENCOUNTER — Telehealth: Payer: Self-pay | Admitting: *Deleted

## 2021-11-17 ENCOUNTER — Inpatient Hospital Stay: Payer: PRIVATE HEALTH INSURANCE

## 2021-11-17 ENCOUNTER — Other Ambulatory Visit: Payer: Self-pay

## 2021-11-17 DIAGNOSIS — D46Z Other myelodysplastic syndromes: Secondary | ICD-10-CM

## 2021-11-17 DIAGNOSIS — D464 Refractory anemia, unspecified: Secondary | ICD-10-CM | POA: Diagnosis not present

## 2021-11-17 LAB — CBC WITH DIFFERENTIAL (CANCER CENTER ONLY)
Abs Immature Granulocytes: 0 10*3/uL (ref 0.00–0.07)
Basophils Absolute: 0 10*3/uL (ref 0.0–0.1)
Basophils Relative: 0 %
Eosinophils Absolute: 0 10*3/uL (ref 0.0–0.5)
Eosinophils Relative: 1 %
HCT: 27.6 % — ABNORMAL LOW (ref 39.0–52.0)
Hemoglobin: 9.2 g/dL — ABNORMAL LOW (ref 13.0–17.0)
Immature Granulocytes: 0 %
Lymphocytes Relative: 72 %
Lymphs Abs: 1.7 10*3/uL (ref 0.7–4.0)
MCH: 31.9 pg (ref 26.0–34.0)
MCHC: 33.3 g/dL (ref 30.0–36.0)
MCV: 95.8 fL (ref 80.0–100.0)
Monocytes Absolute: 0.5 10*3/uL (ref 0.1–1.0)
Monocytes Relative: 22 %
Neutro Abs: 0.1 10*3/uL — CL (ref 1.7–7.7)
Neutrophils Relative %: 5 %
Platelet Count: 22 10*3/uL — ABNORMAL LOW (ref 150–400)
RBC: 2.88 MIL/uL — ABNORMAL LOW (ref 4.22–5.81)
RDW: 15 % (ref 11.5–15.5)
WBC Count: 2.4 10*3/uL — ABNORMAL LOW (ref 4.0–10.5)
nRBC: 2.1 % — ABNORMAL HIGH (ref 0.0–0.2)

## 2021-11-17 LAB — CMP (CANCER CENTER ONLY)
ALT: 15 U/L (ref 0–44)
AST: 16 U/L (ref 15–41)
Albumin: 3.8 g/dL (ref 3.5–5.0)
Alkaline Phosphatase: 88 U/L (ref 38–126)
Anion gap: 6 (ref 5–15)
BUN: 23 mg/dL — ABNORMAL HIGH (ref 6–20)
CO2: 30 mmol/L (ref 22–32)
Calcium: 10.4 mg/dL — ABNORMAL HIGH (ref 8.9–10.3)
Chloride: 100 mmol/L (ref 98–111)
Creatinine: 0.79 mg/dL (ref 0.61–1.24)
GFR, Estimated: >60 mL/min (ref >60)
Glucose, Bld: 89 mg/dL (ref 70–99)
Potassium: 4.1 mmol/L (ref 3.5–5.1)
Sodium: 136 mmol/L (ref 135–145)
Total Bilirubin: 0.4 mg/dL (ref 0.3–1.2)
Total Protein: 8.7 g/dL — ABNORMAL HIGH (ref 6.5–8.1)

## 2021-11-17 LAB — PREPARE PLATELET PHERESIS: Unit division: 0

## 2021-11-17 LAB — BPAM PLATELET PHERESIS
Blood Product Expiration Date: 202212162359
ISSUE DATE / TIME: 202212161022
Unit Type and Rh: 600

## 2021-11-17 NOTE — Telephone Encounter (Signed)
Critical ANC .1 reported by Richardson Landry in lab.  Platelets 22, Hgb 9.2, WBC 2.4.  Dr Marin Olp notified.  No orders received.  Patient will be here Thursday for repeat labs.

## 2021-11-18 ENCOUNTER — Other Ambulatory Visit: Payer: Self-pay | Admitting: Hematology & Oncology

## 2021-11-18 ENCOUNTER — Telehealth: Payer: Self-pay | Admitting: Hematology & Oncology

## 2021-11-18 NOTE — Telephone Encounter (Signed)
Attempted to contact patient to advise about appts that [facility] scheduled, per sch msg 12/20. Voicemail was left in Spanish advise to please check Mychart regarding appts and a letter will also be sent out.

## 2021-11-18 NOTE — Progress Notes (Signed)
DISCONTINUE ON PATHWAY REGIMEN - MDS     A cycle is every 28 days:     Azacitidine   **Always confirm dose/schedule in your pharmacy ordering system**  REASON: Disease Progression PRIOR TREATMENT: [id number]: Azacitidine 75 mg/m2 Subcutaneously Days 1-7 q28 Days, Concurrent with Referral to Transplant Service TREATMENT RESPONSE: Partial Response (PR)  START OFF PATHWAY REGIMEN - MDS   [id number]:Decitabine:   A cycle is every 28 days:     Decitabine   **Always confirm dose/schedule in your pharmacy ordering system**  Patient Characteristics: Higher-Risk (IPSS-R Score > 3.5), Second Line, Transplant Candidate WHO Disease Classification: MDS-U Bone Marrow Blasts (percent): > 2% to < 5% Cytogenetic Category: Poor Platelets (x 10^9/L): < 50 Absolute Neutrophil Count (x 10^9/L): ? 0.8 Line of Therapy: Second Line IPSS-R Risk Category: Very High IPSS-R Risk Score: 6.5 Check here if patient's risk score was calculated prior to the International Prognostic Scoring System-Revised (IPSS-R): false Hemoglobin (g/dl): < 8 Patient Characteristics: Transplant Candidate Intent of Therapy: Curative Intent, Discussed with Patient

## 2021-11-20 ENCOUNTER — Inpatient Hospital Stay: Payer: PRIVATE HEALTH INSURANCE

## 2021-11-20 ENCOUNTER — Other Ambulatory Visit: Payer: Self-pay

## 2021-11-20 ENCOUNTER — Telehealth: Payer: Self-pay | Admitting: *Deleted

## 2021-11-20 DIAGNOSIS — D46Z Other myelodysplastic syndromes: Secondary | ICD-10-CM

## 2021-11-20 DIAGNOSIS — D464 Refractory anemia, unspecified: Secondary | ICD-10-CM | POA: Diagnosis not present

## 2021-11-20 LAB — CBC WITH DIFFERENTIAL (CANCER CENTER ONLY)
Abs Immature Granulocytes: 0 10*3/uL (ref 0.00–0.07)
Basophils Absolute: 0 10*3/uL (ref 0.0–0.1)
Basophils Relative: 0 %
Eosinophils Absolute: 0 10*3/uL (ref 0.0–0.5)
Eosinophils Relative: 0 %
HCT: 26.5 % — ABNORMAL LOW (ref 39.0–52.0)
Hemoglobin: 8.9 g/dL — ABNORMAL LOW (ref 13.0–17.0)
Immature Granulocytes: 0 %
Lymphocytes Relative: 76 %
Lymphs Abs: 2.1 10*3/uL (ref 0.7–4.0)
MCH: 31.9 pg (ref 26.0–34.0)
MCHC: 33.6 g/dL (ref 30.0–36.0)
MCV: 95 fL (ref 80.0–100.0)
Monocytes Absolute: 0.5 10*3/uL (ref 0.1–1.0)
Monocytes Relative: 17 %
Neutro Abs: 0.2 10*3/uL — CL (ref 1.7–7.7)
Neutrophils Relative %: 7 %
Platelet Count: 32 10*3/uL — ABNORMAL LOW (ref 150–400)
RBC: 2.79 MIL/uL — ABNORMAL LOW (ref 4.22–5.81)
RDW: 14.8 % (ref 11.5–15.5)
WBC Count: 2.7 10*3/uL — ABNORMAL LOW (ref 4.0–10.5)
nRBC: 1.5 % — ABNORMAL HIGH (ref 0.0–0.2)

## 2021-11-20 LAB — CMP (CANCER CENTER ONLY)
ALT: 13 U/L (ref 0–44)
AST: 15 U/L (ref 15–41)
Albumin: 3.9 g/dL (ref 3.5–5.0)
Alkaline Phosphatase: 92 U/L (ref 38–126)
Anion gap: 6 (ref 5–15)
BUN: 27 mg/dL — ABNORMAL HIGH (ref 6–20)
CO2: 29 mmol/L (ref 22–32)
Calcium: 10.3 mg/dL (ref 8.9–10.3)
Chloride: 100 mmol/L (ref 98–111)
Creatinine: 0.81 mg/dL (ref 0.61–1.24)
GFR, Estimated: >60 mL/min (ref >60)
Glucose, Bld: 97 mg/dL (ref 70–99)
Potassium: 4.4 mmol/L (ref 3.5–5.1)
Sodium: 135 mmol/L (ref 135–145)
Total Bilirubin: 0.4 mg/dL (ref 0.3–1.2)
Total Protein: 8.8 g/dL — ABNORMAL HIGH (ref 6.5–8.1)

## 2021-11-20 NOTE — Telephone Encounter (Signed)
Dr. Ennever notified of ANC-0.2.  No new orders received at this time.  

## 2021-11-25 ENCOUNTER — Inpatient Hospital Stay: Payer: PRIVATE HEALTH INSURANCE

## 2021-11-25 ENCOUNTER — Other Ambulatory Visit: Payer: Self-pay

## 2021-11-25 ENCOUNTER — Encounter: Payer: Self-pay | Admitting: Hematology & Oncology

## 2021-11-25 ENCOUNTER — Inpatient Hospital Stay (HOSPITAL_BASED_OUTPATIENT_CLINIC_OR_DEPARTMENT_OTHER): Payer: PRIVATE HEALTH INSURANCE | Admitting: Hematology & Oncology

## 2021-11-25 ENCOUNTER — Telehealth: Payer: Self-pay

## 2021-11-25 VITALS — BP 98/53 | HR 103 | Temp 97.6°F | Resp 18 | Wt 153.0 lb

## 2021-11-25 DIAGNOSIS — D46Z Other myelodysplastic syndromes: Secondary | ICD-10-CM

## 2021-11-25 DIAGNOSIS — D464 Refractory anemia, unspecified: Secondary | ICD-10-CM | POA: Diagnosis not present

## 2021-11-25 DIAGNOSIS — D696 Thrombocytopenia, unspecified: Secondary | ICD-10-CM

## 2021-11-25 DIAGNOSIS — D649 Anemia, unspecified: Secondary | ICD-10-CM

## 2021-11-25 DIAGNOSIS — D461 Refractory anemia with ring sideroblasts: Secondary | ICD-10-CM

## 2021-11-25 LAB — CMP (CANCER CENTER ONLY)
ALT: 12 U/L (ref 0–44)
AST: 16 U/L (ref 15–41)
Albumin: 3.8 g/dL (ref 3.5–5.0)
Alkaline Phosphatase: 104 U/L (ref 38–126)
Anion gap: 7 (ref 5–15)
BUN: 23 mg/dL — ABNORMAL HIGH (ref 6–20)
CO2: 28 mmol/L (ref 22–32)
Calcium: 10.4 mg/dL — ABNORMAL HIGH (ref 8.9–10.3)
Chloride: 99 mmol/L (ref 98–111)
Creatinine: 0.86 mg/dL (ref 0.61–1.24)
GFR, Estimated: >60 mL/min (ref >60)
Glucose, Bld: 103 mg/dL — ABNORMAL HIGH (ref 70–99)
Potassium: 3.6 mmol/L (ref 3.5–5.1)
Sodium: 134 mmol/L — ABNORMAL LOW (ref 135–145)
Total Bilirubin: 0.5 mg/dL (ref 0.3–1.2)
Total Protein: 9 g/dL — ABNORMAL HIGH (ref 6.5–8.1)

## 2021-11-25 LAB — IRON AND IRON BINDING CAPACITY (CC-WL,HP ONLY)
Iron: 58 ug/dL (ref 45–182)
Saturation Ratios: 29 % (ref 17.9–39.5)
TIBC: 200 ug/dL — ABNORMAL LOW (ref 250–450)
UIBC: 142 ug/dL (ref 117–376)

## 2021-11-25 LAB — CBC WITH DIFFERENTIAL (CANCER CENTER ONLY)
Abs Immature Granulocytes: 0.03 10*3/uL (ref 0.00–0.07)
Basophils Absolute: 0 10*3/uL (ref 0.0–0.1)
Basophils Relative: 0 %
Eosinophils Absolute: 0 10*3/uL (ref 0.0–0.5)
Eosinophils Relative: 0 %
HCT: 24.2 % — ABNORMAL LOW (ref 39.0–52.0)
Hemoglobin: 8.3 g/dL — ABNORMAL LOW (ref 13.0–17.0)
Immature Granulocytes: 1 %
Lymphocytes Relative: 78 %
Lymphs Abs: 2.9 10*3/uL (ref 0.7–4.0)
MCH: 31.9 pg (ref 26.0–34.0)
MCHC: 34.3 g/dL (ref 30.0–36.0)
MCV: 93.1 fL (ref 80.0–100.0)
Monocytes Absolute: 0.6 10*3/uL (ref 0.1–1.0)
Monocytes Relative: 17 %
Neutro Abs: 0.1 10*3/uL — CL (ref 1.7–7.7)
Neutrophils Relative %: 4 %
Platelet Count: 14 10*3/uL — ABNORMAL LOW (ref 150–400)
RBC: 2.6 MIL/uL — ABNORMAL LOW (ref 4.22–5.81)
RDW: 14.9 % (ref 11.5–15.5)
WBC Count: 3.8 10*3/uL — ABNORMAL LOW (ref 4.0–10.5)
nRBC: 1.6 % — ABNORMAL HIGH (ref 0.0–0.2)

## 2021-11-25 LAB — LACTATE DEHYDROGENASE: LDH: 210 U/L — ABNORMAL HIGH (ref 98–192)

## 2021-11-25 LAB — SAVE SMEAR(SSMR), FOR PROVIDER SLIDE REVIEW

## 2021-11-25 LAB — RETICULOCYTES
Immature Retic Fract: 4.9 % (ref 2.3–15.9)
RBC.: 2.6 MIL/uL — ABNORMAL LOW (ref 4.22–5.81)
Retic Ct Pct: <0.4 % — ABNORMAL LOW (ref 0.4–3.1)

## 2021-11-25 LAB — FERRITIN: Ferritin: 3462 ng/mL — ABNORMAL HIGH (ref 24–336)

## 2021-11-25 MED ORDER — LIDOCAINE-PRILOCAINE 2.5-2.5 % EX CREA: TOPICAL_CREAM | CUTANEOUS | 3 refills | Status: AC

## 2021-11-25 MED ORDER — LORAZEPAM 0.5 MG PO TABS: 0.5000 mg | ORAL_TABLET | Freq: Four times a day (QID) | ORAL | 0 refills | Status: AC | PRN

## 2021-11-25 MED ORDER — SODIUM CHLORIDE 0.9 % IV SOLN
20.0000 mg/m2 | Freq: Once | INTRAVENOUS | Status: AC
  Administered 2021-11-25: 10:00:00 40 mg via INTRAVENOUS
  Filled 2021-11-25: qty 8

## 2021-11-25 MED ORDER — PROCHLORPERAZINE MALEATE 10 MG PO TABS: 10.0000 mg | ORAL_TABLET | Freq: Four times a day (QID) | ORAL | 1 refills | Status: AC | PRN

## 2021-11-25 MED ORDER — ONDANSETRON HCL 8 MG PO TABS: 8.0000 mg | ORAL_TABLET | Freq: Two times a day (BID) | ORAL | 1 refills | Status: AC | PRN

## 2021-11-25 MED ORDER — SODIUM CHLORIDE 0.9 % IV SOLN: Freq: Once | INTRAVENOUS | Status: AC

## 2021-11-25 MED ORDER — PROCHLORPERAZINE MALEATE 10 MG PO TABS: 10.0000 mg | ORAL_TABLET | Freq: Once | ORAL | Status: DC

## 2021-11-25 NOTE — Patient Instructions (Signed)
[facility] [place] [place]  Discharge Instructions: Thank you for choosing [facility] to provide your oncology and hematology care.   If you have a lab appointment with the [facility], please go directly to the [facility] and check in at the registration area.  Wear comfortable clothing and clothing appropriate for easy access to any Portacath or PICC line.   We strive to give you quality time with your provider. You may need to reschedule your appointment if you arrive late (15 or more minutes).  Arriving late affects you and other patients whose appointments are after yours.  Also, if you miss three or more appointments without notifying the office, you may be dismissed from the clinic at the providers discretion.      For prescription refill requests, have your pharmacy contact our office and allow 72 hours for refills to be completed.    Today you received the following chemotherapy and/or immunotherapy agents Dacogen      To help prevent nausea and vomiting after your treatment, we encourage you to take your nausea medication as directed.  BELOW ARE SYMPTOMS THAT SHOULD BE REPORTED IMMEDIATELY: *FEVER GREATER THAN 100.4 F (38 C) OR HIGHER *CHILLS OR SWEATING *NAUSEA AND VOMITING THAT IS NOT CONTROLLED WITH YOUR NAUSEA MEDICATION *UNUSUAL SHORTNESS OF BREATH *UNUSUAL BRUISING OR BLEEDING *URINARY PROBLEMS (pain or burning when urinating, or frequent urination) *BOWEL PROBLEMS (unusual diarrhea, constipation, pain near the anus) TENDERNESS IN MOUTH AND THROAT WITH OR WITHOUT PRESENCE OF ULCERS (sore throat, sores in mouth, or a toothache) UNUSUAL RASH, SWELLING OR PAIN  UNUSUAL VAGINAL DISCHARGE OR ITCHING   Items with * indicate a potential emergency and should be followed up as soon as possible or go to the Emergency Department if any problems should occur.  Please show the CHEMOTHERAPY ALERT CARD or IMMUNOTHERAPY ALERT CARD at check-in to the  Emergency Department and triage nurse. Should you have questions after your visit or need to cancel or reschedule your appointment, please contact [facility]  [phone] and follow the prompts.  Office hours are 8:00 a.m. to 4:30 p.m. Monday - Friday. Please note that voicemails left after 4:00 p.m. may not be returned until the following business day.  We are closed weekends and major holidays. You have access to a nurse at all times for urgent questions. Please call the main number to the clinic [phone] and follow the prompts.  For any non-urgent questions, you may also contact your provider using MyChart. We now offer e-Visits for anyone 43 and older to request care online for non-urgent symptoms. For details visit [email or web address].   Also download the MyChart app! Go to the app store, search "MyChart", open the app, select , and log in with your MyChart username and password.  Due to Covid, a mask is required upon entering the hospital/clinic. If you do not have a mask, one will be given to you upon arrival. For doctor visits, patients may have 1 support person aged 16 or older with them. For treatment visits, patients cannot have anyone with them due to current Covid guidelines and our immunocompromised population.

## 2021-11-25 NOTE — Progress Notes (Signed)
Hematology and Oncology Follow Up Visit  Thomas Frazier [id number] Jun 29, 1966 55 y.o. 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  Unfortunately, looks like the Vidaza has weight loss since affect.  He actually had a recent bone marrow biopsy done at [facility].  I think he was admitted there because of pneumonia.  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  He had not yet transformed over to acute leukemia.  Hopefully, he will be able to have a allogeneic transplant in February.  Until then, we are going to have to make a change in his protocol.  We will have him on decitabine along with venetoclax.  I think this is reasonable.  There is been no problems with bleeding.  His platelet count is 14,000.  I would like to try to hold off on platelet transfusion unless he is less than 10,000 with his platelets.  He has had no rashes.  He does have bony pain.  I suspect this probably is from his underlying disease.  He does have the aortic stenosis.  At some point, this is going to have to be fixed.  He has had no swollen lymph nodes.  He has had no mouth sores.  He is tired.  Overall, I would have to say his performance status is probably ECOG 1.       Medications:  Allergies as of 11/25/2021       Reactions   Ciprofloxacin Rash   Famciclovir Rash   Tolerated acyclovir + fluconazole (see 10/23/2021 discharge) w/o symptoms   Fluconazole Rash   Tolerated acyclovir + fluconazole (see 10/23/2021 discharge) w/o symptoms        Medication List        Accurate as of November 25, 2021   8:39 AM. If you have any questions, ask your nurse or doctor.          azaCITIDine 100 MG Susr Commonly known as: VIDAZA Vidaza 142.5 mg (rounded from 143.25 mg)=75 mg/m2 x 1.91 m2 daily times five days every twenty eight days   benzonatate 100 MG capsule Commonly known as: TESSALON Take 1 capsule by mouth 3 (three) times daily as needed.   carvedilol 3.125 MG tablet Commonly known as: COREG Take 1 tablet (3.125 mg total) by mouth 2 (two) times daily with a meal.   digoxin 0.25 MG tablet Commonly known as: LANOXIN Take 1 tablet (0.25 mg total) by mouth daily.   ibuprofen 200 MG tablet Commonly known as: ADVIL Take by mouth. Once Daily   levofloxacin 750 MG tablet Commonly known as: LEVAQUIN Take 750 mg by mouth daily.   Melatonin 10 MG Tabs Take 10 mg by mouth at bedtime as needed.   MULTIVITAMIN/IRON PO Take 1 tablet by mouth daily.   sacubitril-valsartan 24-26 MG Commonly known as: ENTRESTO Take 1 tablet by mouth 2 (two) times daily.   traMADol 50 MG tablet Commonly known as: ULTRAM Take 2 tablets (100 mg total) by mouth every 6 (six) hours as needed. 1-2 tablets (53m- 1031m by mouth every 6 hours as needed  Allergies:  Allergies  Allergen Reactions   Ciprofloxacin Rash   Famciclovir Rash    Tolerated acyclovir + fluconazole (see 10/23/2021 discharge) w/o symptoms   Fluconazole Rash    Tolerated acyclovir + fluconazole (see 10/23/2021 discharge) w/o symptoms    Past Medical History, Surgical history, Social history, and Family History were reviewed and updated.  Review of Systems: Review of Systems  Constitutional:  Positive for malaise/fatigue. Negative for diaphoresis.  HENT: Negative.    Eyes: Negative.   Respiratory:  Positive for shortness of breath.   Cardiovascular: Negative.   Gastrointestinal: Negative.   Genitourinary: Negative.   Musculoskeletal:  Positive for joint pain.  Skin: Negative.   Neurological: Negative.    Endo/Heme/Allergies: Negative.   Psychiatric/Behavioral: Negative.      Physical Exam:  vitals were not taken for this visit.   Wt Readings from Last 3 Encounters:  10/20/21 160 lb (72.6 kg)  10/06/21 161 lb (73 kg)  09/09/21 162 lb 8 oz (73.7 kg)    Physical Exam Vitals reviewed.  HENT:     Head: Normocephalic and atraumatic.  Eyes:     Pupils: Pupils are equal, round, and reactive to light.  Cardiovascular:     Rate and Rhythm: Normal rate and regular rhythm.     Heart sounds: Normal heart sounds.     Comments: Cardiac exam shows a regular rate and rhythm.  He has a 3/6 systolic ejection murmur consistent with aortic stenosis. Pulmonary:     Effort: Pulmonary effort is normal.     Breath sounds: Normal breath sounds.  Abdominal:     General: Bowel sounds are normal.     Palpations: Abdomen is soft.  Musculoskeletal:        General: No tenderness or deformity. Normal range of motion.     Cervical back: Normal range of motion.  Lymphadenopathy:     Cervical: No cervical adenopathy.  Skin:    General: Skin is warm and dry.     Findings: No erythema or rash.  Neurological:     Mental Status: He is alert and oriented to person, place, and time.  Psychiatric:        Behavior: Behavior normal.        Thought Content: Thought content normal.        Judgment: Judgment normal.    Lab Results  Component Value Date   WBC 3.8 (L) 11/25/2021   HGB 8.3 (L) 11/25/2021   HCT 24.2 (L) 11/25/2021   MCV 93.1 11/25/2021   PLT 14 (L) 11/25/2021   Lab Results  Component Value Date   FERRITIN 1,257 (H) 10/06/2021   IRON 84 10/06/2021   TIBC 209 10/06/2021   UIBC 125 10/06/2021   IRONPCTSAT 40 10/06/2021   Lab Results  Component Value Date   RETICCTPCT <0.4 (L) 11/25/2021   RBC 2.60 (L) 11/25/2021   No results found for: KPAFRELGTCHN, LAMBDASER, KAPLAMBRATIO No results found for: IGGSERUM, IGA, IGMSERUM No results found for: Odetta Pink, SPEI   Chemistry      Component Value Date/Time   NA 135 11/20/2021 1038   K 4.4 11/20/2021 1038   CL 100 11/20/2021 1038   CO2 29 11/20/2021 1038   BUN 27 (H) 11/20/2021 1038   CREATININE 0.81 11/20/2021 1038      Component Value Date/Time   CALCIUM 10.3 11/20/2021 1038   ALKPHOS 92 11/20/2021 1038   AST 15 11/20/2021 1038   ALT 13 11/20/2021  1038   BILITOT 0.4 11/20/2021 1038       Impression and Plan: Mr. Nalepa is a very pleasant 55 yo Hispanic gentleman with high grade myelodysplasia, complex cytogenetics.  He has had Vidaza.  Initially seem to have responded very nicely to [place] with his blood counts improving.  Unfortunately, he now is regressing.  I think the bone marrow biopsy showed [place] this.  We really have to try to get him to transplant if possible.  We will make the switch over to decitabine/venetoclax.  Hopefully this will improve his blood counts and decrease some of the burden in his bone marrow from the myelodysplasia.  We will have to follow his blood work twice a week.  I know that he is trying his best.  He really has gone through quite a lot.  He has been a true aspiration to [place].  He understands why we had to make the change.  His specialist at [facility] really had done a great job with him.  I know this is incredibly complex.  Again, we will have to check blood work twice a week.  We will plan for his next cycle in January.   [name], MD 12/27/20228:39 AM

## 2021-11-25 NOTE — Telephone Encounter (Signed)
Critical ANC of 0.1 from lab, MD aware

## 2021-11-25 NOTE — Progress Notes (Signed)
CBC and CMET reviewed with MD, ok to treat despite counts. 

## 2021-11-26 ENCOUNTER — Ambulatory Visit: Payer: Self-pay | Admitting: Student

## 2021-11-26 ENCOUNTER — Encounter: Payer: Self-pay | Admitting: Hematology & Oncology

## 2021-11-26 ENCOUNTER — Inpatient Hospital Stay: Payer: PRIVATE HEALTH INSURANCE

## 2021-11-26 VITALS — BP 114/65 | HR 99 | Temp 97.8°F | Resp 17

## 2021-11-26 DIAGNOSIS — D464 Refractory anemia, unspecified: Secondary | ICD-10-CM | POA: Diagnosis not present

## 2021-11-26 DIAGNOSIS — D46Z Other myelodysplastic syndromes: Secondary | ICD-10-CM

## 2021-11-26 MED ORDER — PROCHLORPERAZINE MALEATE 10 MG PO TABS: 10.0000 mg | ORAL_TABLET | Freq: Once | ORAL | Status: DC

## 2021-11-26 MED ORDER — SODIUM CHLORIDE 0.9 % IV SOLN: Freq: Once | INTRAVENOUS | Status: AC

## 2021-11-26 MED ORDER — SODIUM CHLORIDE 0.9 % IV SOLN
20.0000 mg/m2 | Freq: Once | INTRAVENOUS | Status: AC
  Administered 2021-11-26: 12:00:00 40 mg via INTRAVENOUS
  Filled 2021-11-26: qty 8

## 2021-11-26 NOTE — Patient Instructions (Signed)
[facility] [place]  Discharge Instructions: Thank you for choosing [facility] to provide your oncology and hematology care.   If you have a lab appointment with the [facility], please go directly to the [facility] and check in at the registration area.  Wear comfortable clothing and clothing appropriate for easy access to any Portacath or PICC line.   We strive to give you quality time with your provider. You may need to reschedule your appointment if you arrive late (15 or more minutes).  Arriving late affects you and other patients whose appointments are after yours.  Also, if you miss three or more appointments without notifying the office, you may be dismissed from the clinic at the providers discretion.      For prescription refill requests, have your pharmacy contact our office and allow 72 hours for refills to be completed.    Today you received the following chemotherapy and/or immunotherapy agents dacogen     To help prevent nausea and vomiting after your treatment, we encourage you to take your nausea medication as directed.  BELOW ARE SYMPTOMS THAT SHOULD BE REPORTED IMMEDIATELY: *FEVER GREATER THAN 100.4 F (38 C) OR HIGHER *CHILLS OR SWEATING *NAUSEA AND VOMITING THAT IS NOT CONTROLLED WITH YOUR NAUSEA MEDICATION *UNUSUAL SHORTNESS OF BREATH *UNUSUAL BRUISING OR BLEEDING *URINARY PROBLEMS (pain or burning when urinating, or frequent urination) *BOWEL PROBLEMS (unusual diarrhea, constipation, pain near the anus) TENDERNESS IN MOUTH AND THROAT WITH OR WITHOUT PRESENCE OF ULCERS (sore throat, sores in mouth, or a toothache) UNUSUAL RASH, SWELLING OR PAIN  UNUSUAL VAGINAL DISCHARGE OR ITCHING   Items with * indicate a potential emergency and should be followed up as soon as possible or go to the Emergency Department if any problems should occur.  Please show the CHEMOTHERAPY ALERT CARD or IMMUNOTHERAPY ALERT CARD at check-in to the  Emergency Department and triage nurse. Should you have questions after your visit or need to cancel or reschedule your appointment, please contact [facility]  [phone] and follow the prompts.  Office hours are 8:00 a.m. to 4:30 p.m. Monday - Friday. Please note that voicemails left after 4:00 p.m. may not be returned until the following business day.  We are closed weekends and major holidays. You have access to a nurse at all times for urgent questions. Please call the main number to the clinic [phone] and follow the prompts.  For any non-urgent questions, you may also contact your provider using MyChart. We now offer e-Visits for anyone 45 and older to request care online for non-urgent symptoms. For details visit [email or web address].   Also download the MyChart app! Go to the app store, search "MyChart", open the app, select [facility], and log in with your MyChart username and password.  Due to Covid, a mask is required upon entering the hospital/clinic. If you do not have a mask, one will be given to you upon arrival. For doctor visits, patients may have 1 support person aged 58 or older with them. For treatment visits, patients cannot have anyone with them due to current Covid guidelines and our immunocompromised population.

## 2021-11-27 ENCOUNTER — Inpatient Hospital Stay: Payer: PRIVATE HEALTH INSURANCE

## 2021-11-27 ENCOUNTER — Other Ambulatory Visit: Payer: Self-pay

## 2021-11-27 ENCOUNTER — Other Ambulatory Visit: Payer: PRIVATE HEALTH INSURANCE

## 2021-11-27 ENCOUNTER — Telehealth: Payer: Self-pay | Admitting: *Deleted

## 2021-11-27 VITALS — BP 109/60 | HR 83 | Temp 99.2°F | Resp 17

## 2021-11-27 DIAGNOSIS — D46Z Other myelodysplastic syndromes: Secondary | ICD-10-CM

## 2021-11-27 DIAGNOSIS — D464 Refractory anemia, unspecified: Secondary | ICD-10-CM | POA: Diagnosis not present

## 2021-11-27 DIAGNOSIS — D696 Thrombocytopenia, unspecified: Secondary | ICD-10-CM

## 2021-11-27 LAB — CMP (CANCER CENTER ONLY)
ALT: 13 U/L (ref 0–44)
AST: 17 U/L (ref 15–41)
Albumin: 3.6 g/dL (ref 3.5–5.0)
Alkaline Phosphatase: 96 U/L (ref 38–126)
Anion gap: 8 (ref 5–15)
BUN: 23 mg/dL — ABNORMAL HIGH (ref 6–20)
CO2: 27 mmol/L (ref 22–32)
Calcium: 10 mg/dL (ref 8.9–10.3)
Chloride: 99 mmol/L (ref 98–111)
Creatinine: 0.77 mg/dL (ref 0.61–1.24)
GFR, Estimated: >60 mL/min
Glucose, Bld: 78 mg/dL (ref 70–99)
Potassium: 4.1 mmol/L (ref 3.5–5.1)
Sodium: 134 mmol/L — ABNORMAL LOW (ref 135–145)
Total Bilirubin: 0.5 mg/dL (ref 0.3–1.2)
Total Protein: 8.6 g/dL — ABNORMAL HIGH (ref 6.5–8.1)

## 2021-11-27 LAB — CBC WITH DIFFERENTIAL (CANCER CENTER ONLY)
Abs Immature Granulocytes: 0.02 10*3/uL (ref 0.00–0.07)
Basophils Absolute: 0 10*3/uL (ref 0.0–0.1)
Basophils Relative: 0 %
Eosinophils Absolute: 0 10*3/uL (ref 0.0–0.5)
Eosinophils Relative: 0 %
HCT: 22.1 % — ABNORMAL LOW (ref 39.0–52.0)
Hemoglobin: 7.4 g/dL — ABNORMAL LOW (ref 13.0–17.0)
Immature Granulocytes: 1 %
Lymphocytes Relative: 82 %
Lymphs Abs: 2.5 10*3/uL (ref 0.7–4.0)
MCH: 31.8 pg (ref 26.0–34.0)
MCHC: 33.5 g/dL (ref 30.0–36.0)
MCV: 94.8 fL (ref 80.0–100.0)
Monocytes Absolute: 0.4 10*3/uL (ref 0.1–1.0)
Monocytes Relative: 13 %
Neutro Abs: 0.1 10*3/uL — CL (ref 1.7–7.7)
Neutrophils Relative %: 4 %
Platelet Count: 8 10*3/uL — CL (ref 150–400)
RBC: 2.33 MIL/uL — ABNORMAL LOW (ref 4.22–5.81)
RDW: 15.1 % (ref 11.5–15.5)
WBC Count: 3 10*3/uL — ABNORMAL LOW (ref 4.0–10.5)
nRBC: 0 % (ref 0.0–0.2)

## 2021-11-27 LAB — PREPARE RBC (CROSSMATCH)

## 2021-11-27 LAB — SAMPLE TO BLOOD BANK

## 2021-11-27 MED ORDER — SODIUM CHLORIDE 0.9% IV SOLUTION: 250.0000 mL | Freq: Once | INTRAVENOUS | Status: DC

## 2021-11-27 MED ORDER — SODIUM CHLORIDE 0.9 % IV SOLN
20.0000 mg/m2 | Freq: Once | INTRAVENOUS | Status: AC
  Administered 2021-11-27: 12:00:00 40 mg via INTRAVENOUS
  Filled 2021-11-27: qty 8

## 2021-11-27 MED ORDER — SODIUM CHLORIDE 0.9 % IV SOLN: Freq: Once | INTRAVENOUS | Status: AC

## 2021-11-27 MED ORDER — PROCHLORPERAZINE MALEATE 10 MG PO TABS: 10.0000 mg | ORAL_TABLET | Freq: Once | ORAL | Status: DC

## 2021-11-27 NOTE — Progress Notes (Signed)
Per Dr Marin Olp, continue with treatment as planned despite abnormal CBC. Pt to receive platelets & 1 unit blood today and 1 unit PRBC tomorrow. dph

## 2021-11-27 NOTE — Patient Instructions (Addendum)
[facility] [place]  Discharge Instructions: Thank you for choosing [facility] to provide your oncology and hematology care.   If you have a lab appointment with the [facility], please go directly to the [facility] and check in at the registration area.  Wear comfortable clothing and clothing appropriate for easy access to any Portacath or PICC line.   We strive to give you quality time with your provider. You may need to reschedule your appointment if you arrive late (15 or more minutes).  Arriving late affects you and other patients whose appointments are after yours.  Also, if you miss three or more appointments without notifying the office, you may be dismissed from the clinic at the providers discretion.      For prescription refill requests, have your pharmacy contact our office and allow 72 hours for refills to be completed.    Today you received the following chemotherapy and/or immunotherapy agents Dacogen      To help prevent nausea and vomiting after your treatment, we encourage you to take your nausea medication as directed.  BELOW ARE SYMPTOMS THAT SHOULD BE REPORTED IMMEDIATELY: *FEVER GREATER THAN 100.4 F (38 C) OR HIGHER *CHILLS OR SWEATING *NAUSEA AND VOMITING THAT IS NOT CONTROLLED WITH YOUR NAUSEA MEDICATION *UNUSUAL SHORTNESS OF BREATH *UNUSUAL BRUISING OR BLEEDING *URINARY PROBLEMS (pain or burning when urinating, or frequent urination) *BOWEL PROBLEMS (unusual diarrhea, constipation, pain near the anus) TENDERNESS IN MOUTH AND THROAT WITH OR WITHOUT PRESENCE OF ULCERS (sore throat, sores in mouth, or a toothache) UNUSUAL RASH, SWELLING OR PAIN  UNUSUAL VAGINAL DISCHARGE OR ITCHING   Items with * indicate a potential emergency and should be followed up as soon as possible or go to the Emergency Department if any problems should occur.  Please show the CHEMOTHERAPY ALERT CARD or IMMUNOTHERAPY ALERT CARD at check-in to the  Emergency Department and triage nurse. Should you have questions after your visit or need to cancel or reschedule your appointment, please contact [facility]  [phone] and follow the prompts.  Office hours are 8:00 a.m. to 4:30 p.m. Monday - Friday. Please note that voicemails left after 4:00 p.m. may not be returned until the following business day.  We are closed weekends and major holidays. You have access to a nurse at all times for urgent questions. Please call the main number to the clinic [phone] and follow the prompts.  For any non-urgent questions, you may also contact your provider using MyChart. We now offer e-Visits for anyone 49 and older to request care online for non-urgent symptoms. For details visit [email or web address].   Also download the MyChart app! Go to the app store, search "MyChart", open the app, select [facility], and log in with your MyChart username and password.  Due to Covid, a mask is required upon entering the hospital/clinic. If you do not have a mask, one will be given to you upon arrival. For doctor visits, patients may have 1 support person aged 18 or older with them. For treatment visits, patients cannot have anyone with them due to current Covid guidelines and our immunocompromised population.  Blood Transfusion, Adult A blood transfusion is a procedure in which you receive blood or a type of blood cell (blood component) through an IV. You may need a blood transfusion when your blood level is low. This may result from a bleeding disorder, illness, injury, or surgery. The blood may come from a  donor. Dennis Bast may also be able to donate blood for yourself (autologous blood donation) before a planned surgery. The blood given in a transfusion is made up of different blood components. You may receive: Red blood cells. These carry oxygen to the cells in the body. Platelets. These help your blood to clot. Plasma. This is the liquid part  of your blood. It carries proteins and other substances throughout the body. White blood cells. These help you fight infections. If you have hemophilia or another clotting disorder, you may also receive other types of blood products. Tell a health care provider about: Any blood disorders you have. Any previous reactions you have had during a blood transfusion. Any allergies you have. All medicines you are taking, including vitamins, herbs, eye drops, creams, and over-the-counter medicines. Any surgeries you have had. Any medical conditions you have, including any recent fever or cold symptoms. Whether you are pregnant or may be pregnant. What are the risks? Generally, this is a safe procedure. However, problems may occur. The most common problems include: A mild allergic reaction, such as red, swollen areas of skin (hives) and itching. Fever or chills. This may be the body's response to new blood cells received. This may occur during or up to 4 hours after the transfusion. More serious problems may include: Transfusion-associated circulatory overload (TACO), or too much fluid in the lungs. This may cause breathing problems. A serious allergic reaction, such as difficulty breathing or swelling around the face and lips. Transfusion-related acute lung injury (TRALI), which causes breathing difficulty and low oxygen in the blood. This can occur within hours of the transfusion or several days later. Iron overload. This can happen after receiving many blood transfusions over a period of time. Infection or virus being transmitted. This is rare because donated blood is carefully tested before it is given. Hemolytic transfusion reaction. This is rare. It happens when your body's defense system (immune system)tries to attack the new blood cells. Symptoms may include fever, chills, nausea, low blood pressure, and low back or chest pain. Transfusion-associated graft-versus-host disease (TAGVHD). This is  rare. It happens when donated cells attack your body's healthy tissues. What happens before the procedure? Medicines Ask your health care provider about: Changing or stopping your regular medicines. This is especially important if you are taking diabetes medicines or blood thinners. Taking medicines such as aspirin and ibuprofen. These medicines can thin your blood. Do not take these medicines unless your health care provider tells you to take them. Taking over-the-counter medicines, vitamins, herbs, and supplements. General instructions Follow instructions from your health care provider about eating and drinking restrictions. You will have a blood test to determine your blood type. This is necessary to know what kind of blood your body will accept and to match it to the donor blood. If you are going to have a planned surgery, you may be able to do an autologous blood donation. This may be done in case you need to have a transfusion. You will have your temperature, blood pressure, and pulse monitored before the transfusion. If you have had an allergic reaction to a transfusion in the past, you may be given medicine to help prevent a reaction. This medicine may be given to you by mouth (orally) or through an IV. Set aside time for the blood transfusion. This procedure generally takes 1-4 hours to complete. What happens during the procedure?  An IV will be inserted into one of your veins. The bag of donated blood will  be attached to your IV. The blood will then enter through your vein. Your temperature, blood pressure, and pulse will be monitored regularly during the transfusion. This monitoring is done to detect early signs of a transfusion reaction. Tell your nurse right away if you have any of these symptoms during the transfusion: Shortness of breath or trouble breathing. Chest or back pain. Fever or chills. Hives or itching. If you have any signs or symptoms of a reaction, your transfusion  will be stopped and you may be given medicine. When the transfusion is complete, your IV will be removed. Pressure may be applied to the IV site for a few minutes. A bandage (dressing)will be applied. The procedure may vary among health care providers and hospitals. What happens after the procedure? Your temperature, blood pressure, pulse, breathing rate, and blood oxygen level will be monitored until you leave the hospital or clinic. Your blood may be tested to see how you are responding to the transfusion. You may be warmed with fluids or blankets to maintain a normal body temperature. If you receive your blood transfusion in an outpatient setting, you will be told whom to contact to report any reactions. Where to find more information For more information on blood transfusions, visit the American Red Cross: redcross.org Summary A blood transfusion is a procedure in which you receive blood or a type of blood cell (blood component) through an IV. The blood you receive may come from a donor or be donated by yourself (autologous blood donation) before a planned surgery. The blood given in a transfusion is made up of different blood components. You may receive red blood cells, platelets, plasma, or white blood cells depending on the condition treated. Your temperature, blood pressure, and pulse will be monitored before, during, and after the transfusion. After the transfusion, your blood may be tested to see how your body has responded. This information is not intended to replace advice given to you by your health care provider. Make sure you discuss any questions you have with your health care provider. Document Revised: 09/21/2019 Document Reviewed: 05/11/2019 Elsevier Patient Education  [place].

## 2021-11-27 NOTE — Telephone Encounter (Signed)
Dr. Marin Olp notified of platelets-8, HGB-7.4 and ANC-0.1.  Orders received for pt to get one unit of irradiated platelets and 2 units of irradiated PRBC's per Dr. Marin Olp.

## 2021-11-28 ENCOUNTER — Inpatient Hospital Stay: Payer: PRIVATE HEALTH INSURANCE

## 2021-11-28 ENCOUNTER — Other Ambulatory Visit: Payer: PRIVATE HEALTH INSURANCE

## 2021-11-28 VITALS — BP 94/52 | HR 70 | Temp 98.1°F | Resp 16

## 2021-11-28 DIAGNOSIS — D46Z Other myelodysplastic syndromes: Secondary | ICD-10-CM

## 2021-11-28 DIAGNOSIS — D464 Refractory anemia, unspecified: Secondary | ICD-10-CM | POA: Diagnosis not present

## 2021-11-28 DIAGNOSIS — D696 Thrombocytopenia, unspecified: Secondary | ICD-10-CM

## 2021-11-28 LAB — BPAM PLATELET PHERESIS
Blood Product Expiration Date: 202301012359
ISSUE DATE / TIME: 202212291221
Unit Type and Rh: 6200

## 2021-11-28 LAB — PREPARE PLATELET PHERESIS: Unit division: 0

## 2021-11-28 MED ORDER — SODIUM CHLORIDE 0.9 % IV SOLN
20.0000 mg/m2 | Freq: Once | INTRAVENOUS | Status: AC
  Administered 2021-11-28: 11:00:00 40 mg via INTRAVENOUS
  Filled 2021-11-28: qty 8

## 2021-11-28 MED ORDER — PROCHLORPERAZINE MALEATE 10 MG PO TABS: 10.0000 mg | ORAL_TABLET | Freq: Once | ORAL | Status: DC

## 2021-11-28 MED ORDER — SODIUM CHLORIDE 0.9 % IV SOLN: Freq: Once | INTRAVENOUS | Status: AC

## 2021-11-28 MED ORDER — ACETAMINOPHEN 325 MG PO TABS
650.0000 mg | ORAL_TABLET | Freq: Once | ORAL | Status: AC
  Administered 2021-11-28: 11:00:00 650 mg via ORAL
  Filled 2021-11-28: qty 2

## 2021-11-28 NOTE — Patient Instructions (Signed)
Decitabine injection for infusion What is this medication? DECITABINE (dee SYE ta been) is a chemotherapy drug. This medicine reduces the growth of cancer cells. It is used to treat adults with myelodysplastic syndromes. This medicine may be used for other purposes; ask your health care provider or pharmacist if you have questions. COMMON BRAND NAME(S): Dacogen What should I tell my care team before I take this medication? They need to know if you have any of these conditions: infection (especially a virus infection such as chickenpox, cold sores, or herpes) kidney disease liver disease an unusual or allergic reaction to decitabine, other medicines, foods, dyes, or preservatives pregnant or trying to get pregnant breast-feeding How should I use this medication? This medicine is for infusion into a vein. It is administered in a hospital or clinic by a doctor or health care professional. Talk to your pediatrician regarding the use of this medicine in children. Special care may be needed. Overdosage: If you think you have taken too much of this medicine contact a poison control center or emergency room at once. NOTE: This medicine is only for you. Do not share this medicine with others. What if I miss a dose? It is important not to miss your dose. Call your doctor or health care professional if you are unable to keep an appointment. What may interact with this medication? vaccines Talk to your doctor or health care professional before taking any of these medicines: aspirin acetaminophen ibuprofen ketoprofen naproxen This list may not describe all possible interactions. Give your health care provider a list of all the medicines, herbs, non-prescription drugs, or dietary supplements you use. Also tell them if you smoke, drink alcohol, or use illegal drugs. Some items may interact with your medicine. What should I watch for while using this medication? Visit your doctor for checks on your  progress. This drug may make you feel generally unwell. This is not uncommon, as chemotherapy can affect healthy cells as well as cancer cells. Report any side effects. Continue your course of treatment even though you feel ill unless your doctor tells you to stop. You may need blood work done while you are taking this medicine. In some cases, you may be given additional medicines to help with side effects. Follow all directions for their use. Call your doctor or health care professional for advice if you get a fever, chills or sore throat, or other symptoms of a cold or flu. Do not treat yourself. This drug decreases your body's ability to fight infections. Try to avoid being around people who are sick. This medicine may increase your risk to bruise or bleed. Call your doctor or health care professional if you notice any unusual bleeding. Do not become pregnant while taking this medicine or for 6 months after stopping it. Women should inform their doctor if they wish to become pregnant or think they might be pregnant. Men should not father a child while taking this medicine and for 3 months after stopping it. There is a potential for serious side effects to an unborn child. Talk to your health care professional or pharmacist for more information. Do not breast-feed an infant while taking this medicine or for at least 2 weeks after stopping it. In males, this medicine may interfere with the ability to father a child. Talk with your doctor or health care professional if you are concerned about your fertility. What side effects may I notice from receiving this medication? Side effects that you should report to your  doctor or health care professional as soon as possible: low blood counts - this medicine may decrease the number of white blood cells, red blood cells and platelets. You may be at increased risk for infections and bleeding. signs of infection - fever or chills, cough, sore throat, pain or  difficulty passing urine signs of decreased platelets or bleeding - bruising, pinpoint red spots on the skin, black, tarry stools, blood in the urine signs of decreased red blood cells - unusual weakness or tiredness, fainting spells, lightheadedness increased blood sugar Side effects that usually do not require medical attention (report to your doctor or health care professional if they continue or are bothersome): constipation diarrhea headache loss of appetite nausea, vomiting skin rash, itching stomach pain water retention weak or tired This list may not describe all possible side effects. Call your doctor for medical advice about side effects. You may report side effects to FDA at 1-800-FDA-1088. Where should I keep my medication? This drug is given in a hospital or clinic and will not be stored at home. NOTE: This sheet is a summary. It may not cover all possible information. If you have questions about this medicine, talk to your doctor, pharmacist, or health care provider.  2022 Elsevier/Gold Standard (2019-01-30 00:00:00) Anemia Anemia is a condition in which there is not enough red blood cells or hemoglobin in the blood. Hemoglobin is a substance in red blood cells that carries oxygen. When you do not have enough red blood cells or hemoglobin (are anemic), your body cannot get enough oxygen and your organs may not work properly. As a result, you may feel very tired or have other problems. What are the causes? Common causes of anemia include: Excessive bleeding. Anemia can be caused by excessive bleeding inside or outside the body, including bleeding from the intestines or from heavy menstrual periods in females. Poor nutrition. Long-lasting (chronic) kidney, thyroid, and liver disease. Bone marrow disorders, spleen problems, and blood disorders. Cancer and treatments for cancer. HIV (human immunodeficiency virus) and AIDS (acquired immunodeficiency syndrome). Infections,  medicines, and autoimmune disorders that destroy red blood cells. What are the signs or symptoms? Symptoms of this condition include: Minor weakness. Dizziness. Headache, or difficulties concentrating and sleeping. Heartbeats that feel irregular or faster than normal (palpitations). Shortness of breath, especially with exercise. Pale skin, lips, and nails, or cold hands and feet. Indigestion and nausea. Symptoms may occur suddenly or develop slowly. If your anemia is mild, you may not have symptoms. How is this diagnosed? This condition is diagnosed based on blood tests, your medical history, and a physical exam. In some cases, a test may be needed in which cells are removed from the soft tissue inside of a bone and looked at under a microscope (bone marrow biopsy). Your health care provider may also check your stool (feces) for blood and may do additional testing to look for the cause of your bleeding. Other tests may include: Imaging tests, such as a CT scan or MRI. A procedure to see inside your esophagus and stomach (endoscopy). A procedure to see inside your colon and rectum (colonoscopy). How is this treated? Treatment for this condition depends on the cause. If you continue to lose a lot of blood, you may need to be treated at a hospital. Treatment may include: Taking supplements of iron, vitamin V56, or folic acid. Taking a hormone medicine (erythropoietin) that can help to stimulate red blood cell growth. Having a blood transfusion. This may be needed if you  lose a lot of blood. Making changes to your diet. Having surgery to remove your spleen. Follow these instructions at home: Take over-the-counter and prescription medicines only as told by your health care provider. Take supplements only as told by your health care provider. Follow any diet instructions that you were given by your health care provider. Keep all follow-up visits as told by your health care provider. This is  important. Contact a health care provider if: You develop new bleeding anywhere in the body. Get help right away if: You are very weak. You are short of breath. You have pain in your abdomen or chest. You are dizzy or feel faint. You have trouble concentrating. You have bloody stools, black stools, or tarry stools. You vomit repeatedly or you vomit up blood. These symptoms may represent a serious problem that is an emergency. Do not wait to see if the symptoms will go away. Get medical help right away. Call your local emergency services (911 in the [place] Do not drive yourself to the hospital. Summary Anemia is a condition in which you do not have enough red blood cells or enough of a substance in your red blood cells that carries oxygen (hemoglobin). Symptoms may occur suddenly or develop slowly. If your anemia is mild, you may not have symptoms. This condition is diagnosed with blood tests, a medical history, and a physical exam. Other tests may be needed. Treatment for this condition depends on the cause of the anemia. This information is not intended to replace advice given to you by your health care provider. Make sure you discuss any questions you have with your health care provider. Document Revised: 10/24/2019 Document Reviewed: 10/24/2019 Elsevier Patient Education  2022 [employer].

## 2021-11-30 LAB — BPAM RBC
Blood Product Expiration Date: 202301262359
Blood Product Expiration Date: 202301262359
ISSUE DATE / TIME: 202212291234
ISSUE DATE / TIME: 202212300747
Unit Type and Rh: 5100
Unit Type and Rh: 5100

## 2021-11-30 LAB — TYPE AND SCREEN
ABO/RH(D): O POS
Antibody Screen: NEGATIVE
Unit division: 0
Unit division: 0

## 2021-12-02 ENCOUNTER — Other Ambulatory Visit: Payer: Self-pay | Admitting: *Deleted

## 2021-12-02 ENCOUNTER — Telehealth: Payer: Self-pay | Admitting: *Deleted

## 2021-12-02 ENCOUNTER — Other Ambulatory Visit: Payer: Self-pay

## 2021-12-02 ENCOUNTER — Inpatient Hospital Stay: Payer: PRIVATE HEALTH INSURANCE | Attending: Family

## 2021-12-02 DIAGNOSIS — D46A Refractory cytopenia with multilineage dysplasia: Secondary | ICD-10-CM | POA: Diagnosis not present

## 2021-12-02 DIAGNOSIS — D46Z Other myelodysplastic syndromes: Secondary | ICD-10-CM

## 2021-12-02 DIAGNOSIS — D696 Thrombocytopenia, unspecified: Secondary | ICD-10-CM

## 2021-12-02 LAB — CBC WITH DIFFERENTIAL (CANCER CENTER ONLY)
Abs Immature Granulocytes: 0 10*3/uL (ref 0.00–0.07)
Basophils Absolute: 0 10*3/uL (ref 0.0–0.1)
Basophils Relative: 0 %
Blasts: 14 %
Eosinophils Absolute: 0 10*3/uL (ref 0.0–0.5)
Eosinophils Relative: 0 %
HCT: 25.2 % — ABNORMAL LOW (ref 39.0–52.0)
Hemoglobin: 8.7 g/dL — ABNORMAL LOW (ref 13.0–17.0)
Lymphocytes Relative: 81 %
Lymphs Abs: 1.6 10*3/uL (ref 0.7–4.0)
MCH: 31.4 pg (ref 26.0–34.0)
MCHC: 34.5 g/dL (ref 30.0–36.0)
MCV: 91 fL (ref 80.0–100.0)
Monocytes Absolute: 0.1 10*3/uL (ref 0.1–1.0)
Monocytes Relative: 3 %
Neutro Abs: 0 10*3/uL — CL (ref 1.7–7.7)
Neutrophils Relative %: 2 %
Platelet Count: 9 10*3/uL — CL (ref 150–400)
RBC: 2.77 MIL/uL — ABNORMAL LOW (ref 4.22–5.81)
RDW: 15.2 % (ref 11.5–15.5)
WBC Count: 2 10*3/uL — ABNORMAL LOW (ref 4.0–10.5)
nRBC: 0 % (ref 0.0–0.2)

## 2021-12-02 LAB — CMP (CANCER CENTER ONLY)
ALT: 17 U/L (ref 0–44)
AST: 24 U/L (ref 15–41)
Albumin: 3 g/dL — ABNORMAL LOW (ref 3.5–5.0)
Alkaline Phosphatase: 93 U/L (ref 38–126)
Anion gap: 10 (ref 5–15)
BUN: 24 mg/dL — ABNORMAL HIGH (ref 6–20)
CO2: 23 mmol/L (ref 22–32)
Calcium: 8.8 mg/dL — ABNORMAL LOW (ref 8.9–10.3)
Chloride: 97 mmol/L — ABNORMAL LOW (ref 98–111)
Creatinine: 0.8 mg/dL (ref 0.61–1.24)
GFR, Estimated: >60 mL/min (ref >60)
Glucose, Bld: 126 mg/dL — ABNORMAL HIGH (ref 70–99)
Potassium: 3.8 mmol/L (ref 3.5–5.1)
Sodium: 130 mmol/L — ABNORMAL LOW (ref 135–145)
Total Bilirubin: 0.3 mg/dL (ref 0.3–1.2)
Total Protein: 8.2 g/dL — ABNORMAL HIGH (ref 6.5–8.1)

## 2021-12-02 LAB — SAMPLE TO BLOOD BANK

## 2021-12-02 NOTE — Telephone Encounter (Signed)
Critical platelets 9,000 and Neutrophil reported.  Dr Marin Olp notified.  Platelet orders received.  To be transfused tomorrow.

## 2021-12-03 ENCOUNTER — Inpatient Hospital Stay: Payer: PRIVATE HEALTH INSURANCE

## 2021-12-03 ENCOUNTER — Other Ambulatory Visit: Payer: Self-pay | Admitting: *Deleted

## 2021-12-03 DIAGNOSIS — M898X9 Other specified disorders of bone, unspecified site: Secondary | ICD-10-CM

## 2021-12-03 DIAGNOSIS — D46A Refractory cytopenia with multilineage dysplasia: Secondary | ICD-10-CM | POA: Diagnosis not present

## 2021-12-03 DIAGNOSIS — D696 Thrombocytopenia, unspecified: Secondary | ICD-10-CM

## 2021-12-03 MED ORDER — SODIUM CHLORIDE 0.9% IV SOLUTION
250.0000 mL | Freq: Once | INTRAVENOUS | Status: AC
  Administered 2021-12-03: 250 mL via INTRAVENOUS

## 2021-12-03 MED ORDER — ACETAMINOPHEN 325 MG PO TABS
650.0000 mg | ORAL_TABLET | Freq: Once | ORAL | Status: AC
  Administered 2021-12-03: 650 mg via ORAL
  Filled 2021-12-03: qty 2

## 2021-12-03 MED ORDER — TRAMADOL HCL 50 MG PO TABS: 100.0000 mg | ORAL_TABLET | Freq: Four times a day (QID) | ORAL | 0 refills | Status: AC | PRN

## 2021-12-03 NOTE — Patient Instructions (Signed)
Platelet Transfusion ?A platelet transfusion is a procedure in which a person receives donated platelets through an IV. Platelets are parts of blood that stick together and form a clot to help the body stop bleeding after an injury. If you have too few platelets, your blood may have trouble clotting. This may cause you to bleed and bruise very easily. ?You may need a platelet transfusion if you have a condition that causes a low number of platelets (thrombocytopenia). A platelet transfusion may be used to stop or prevent excessive bleeding. ?Tell a health care provider about: ?Any reactions you have had during previous transfusions. ?Any allergies you have. ?All medicines you are taking, including vitamins, herbs, eye drops, creams, and over-the-counter medicines. ?Any bleeding problems you have. ?Any surgeries you have had. ?Any medical conditions you have. ?Whether you are pregnant or may be pregnant. ?What are the risks? ?Generally, this is a safe procedure. However, problems may occur, including: ?Fever. ?Infection. ?Allergic reaction to the donated (donor) platelets. ?Your body's disease-fighting system (immune system) attacking the donor platelets (hemolytic reaction). This is rare. ?A rare reaction that causes lung damage (transfusion-related acute lung injury). ?What happens before the procedure? ?Medicines ?Ask your health care provider about: ?Changing or stopping your regular medicines. This is especially important if you are taking diabetes medicines or blood thinners. ?Taking medicines such as aspirin and ibuprofen. These medicines can thin your blood. Do not take these medicines unless your health care provider tells you to take them. ?Taking over-the-counter medicines, vitamins, herbs, and supplements. ?General instructions ?You will have a blood test to determine your blood type. Your blood type determines what kind of platelets you will be given. ?Follow instructions from your health care provider  about eating or drinking restrictions. ?If you have had an allergic reaction to a transfusion in the past, you may be given medicine to help prevent a reaction. ?Your temperature, blood pressure, pulse, and breathing will be monitored. ?What happens during the procedure? ? ?An IV will be inserted into one of your veins. ?For your safety, two health care providers will verify your identity along with the donor platelets about to be infused. ?A bag of donor platelets will be connected to your IV. The platelets will flow into your bloodstream. This usually takes 30-60 minutes. ?Your temperature, blood pressure, pulse, and breathing will be monitored during the transfusion. This helps detect early signs of any reaction. ?You will also be monitored for other symptoms that may indicate a reaction, including chills, hives, or itching. ?If you have signs of a reaction at any time, your transfusion will be stopped, and you may be given medicine to help manage the reaction. ?When your transfusion is complete, your IV will be removed. ?Pressure may be applied to the IV site for a few minutes to stop any bleeding. ?The IV site will be covered with a bandage (dressing). ?The procedure may vary among health care providers and hospitals. ?What can I expect after the procedure? ?Your blood pressure, temperature, pulse, and breathing will be monitored until you leave the hospital or clinic. ?You may have some bruising and soreness at your IV site. ?Follow these instructions at home: ?Medicines ?Take over-the-counter and prescription medicines only as told by your health care provider. ?Talk with your health care provider before you take any medicines that contain aspirin or NSAIDs, such as ibuprofen. These medicines increase your risk for dangerous bleeding. ?IV site care ?Check your IV site every day for signs of infection. Check for: ?  Redness, swelling, or pain. ?Fluid or blood. If fluid or blood drains from your IV site, use your  hands to press down firmly on a bandage covering the area for a minute or two. Doing this should stop the bleeding. ?Warmth. ?Pus or a bad smell. ?General instructions ?Change or remove your dressing as told by your health care provider. ?Return to your normal activities as told by your health care provider. Ask your health care provider what activities are safe for you. ?Do not take baths, swim, or use a hot tub until your health care provider approves. Ask your health care provider if you may take showers. ?Keep all follow-up visits. This is important. ?Contact a health care provider if: ?You have a headache that does not go away with medicine. ?You have hives, rash, or itchy skin. ?You have nausea or vomiting. ?You feel unusually tired or weak. ?You have signs of infection at your IV site. ?Get help right away if: ?You have a fever or chills. ?You urinate less often than usual. ?Your urine is darker colored than normal. ?You have any of the following: ?Trouble breathing. ?Pain in your back, abdomen, or chest. ?Cool, clammy skin. ?A fast heartbeat. ?Summary ?Platelets are tiny pieces of blood cells that clump together to form a blood clot when you have an injury. If you have too few platelets, your blood may have trouble clotting. ?A platelet transfusion is a procedure in which you receive donated platelets through an IV. ?A platelet transfusion may be used to stop or prevent excessive bleeding. ?After the procedure, check your IV site every day for signs of infection. ?This information is not intended to replace advice given to you by your health care provider. Make sure you discuss any questions you have with your health care provider. ?Document Revised: 05/22/2021 Document Reviewed: 05/22/2021 ?Elsevier Patient Education ? 2022 Elsevier Inc. ? ?

## 2021-12-03 NOTE — Progress Notes (Signed)
Pt with chills on arrival to infusion room at [id number]. At 0905-pt.'s temperature up to 100.8 orally. Pt states that he is taking Levaquin 500 mg PO daily.  Dr. Marin Olp notified.  Order received for pt to take Tylenol 650 mg po now and to transfuse platelets as ordered.  Pt understands to return to office for labs as scheduled on Monday 12/08/21 and to call office sooner if nose bleeds continue.

## 2021-12-04 ENCOUNTER — Telehealth: Payer: Self-pay | Admitting: *Deleted

## 2021-12-04 ENCOUNTER — Inpatient Hospital Stay: Payer: PRIVATE HEALTH INSURANCE

## 2021-12-04 ENCOUNTER — Other Ambulatory Visit: Payer: Self-pay

## 2021-12-04 ENCOUNTER — Ambulatory Visit: Payer: PRIVATE HEALTH INSURANCE

## 2021-12-04 ENCOUNTER — Other Ambulatory Visit: Payer: Self-pay | Admitting: *Deleted

## 2021-12-04 DIAGNOSIS — D46Z Other myelodysplastic syndromes: Secondary | ICD-10-CM

## 2021-12-04 DIAGNOSIS — D696 Thrombocytopenia, unspecified: Secondary | ICD-10-CM

## 2021-12-04 DIAGNOSIS — D649 Anemia, unspecified: Secondary | ICD-10-CM

## 2021-12-04 DIAGNOSIS — D46A Refractory cytopenia with multilineage dysplasia: Secondary | ICD-10-CM | POA: Diagnosis not present

## 2021-12-04 LAB — BPAM PLATELET PHERESIS
Blood Product Expiration Date: 202301042359
ISSUE DATE / TIME: 202301040735
Unit Type and Rh: 6200

## 2021-12-04 LAB — CMP (CANCER CENTER ONLY)
ALT: 14 U/L (ref 0–44)
AST: 17 U/L (ref 15–41)
Albumin: 3.5 g/dL (ref 3.5–5.0)
Alkaline Phosphatase: 89 U/L (ref 38–126)
Anion gap: 6 (ref 5–15)
BUN: 21 mg/dL — ABNORMAL HIGH (ref 6–20)
CO2: 29 mmol/L (ref 22–32)
Calcium: 10.1 mg/dL (ref 8.9–10.3)
Chloride: 99 mmol/L (ref 98–111)
Creatinine: 0.81 mg/dL (ref 0.61–1.24)
GFR, Estimated: >60 mL/min (ref >60)
Glucose, Bld: 105 mg/dL — ABNORMAL HIGH (ref 70–99)
Potassium: 4.1 mmol/L (ref 3.5–5.1)
Sodium: 134 mmol/L — ABNORMAL LOW (ref 135–145)
Total Bilirubin: 0.5 mg/dL (ref 0.3–1.2)
Total Protein: 8 g/dL (ref 6.5–8.1)

## 2021-12-04 LAB — CBC WITH DIFFERENTIAL (CANCER CENTER ONLY)
Abs Immature Granulocytes: 0.01 10*3/uL (ref 0.00–0.07)
Basophils Absolute: 0 10*3/uL (ref 0.0–0.1)
Basophils Relative: 0 %
Eosinophils Absolute: 0 10*3/uL (ref 0.0–0.5)
Eosinophils Relative: 0 %
HCT: 25.1 % — ABNORMAL LOW (ref 39.0–52.0)
Hemoglobin: 8.5 g/dL — ABNORMAL LOW (ref 13.0–17.0)
Immature Granulocytes: 1 %
Lymphocytes Relative: 87 %
Lymphs Abs: 1.2 10*3/uL (ref 0.7–4.0)
MCH: 31.1 pg (ref 26.0–34.0)
MCHC: 33.9 g/dL (ref 30.0–36.0)
MCV: 91.9 fL (ref 80.0–100.0)
Monocytes Absolute: 0.2 10*3/uL (ref 0.1–1.0)
Monocytes Relative: 11 %
Neutro Abs: 0 10*3/uL — CL (ref 1.7–7.7)
Neutrophils Relative %: 1 %
Platelet Count: 13 10*3/uL — ABNORMAL LOW (ref 150–400)
RBC: 2.73 MIL/uL — ABNORMAL LOW (ref 4.22–5.81)
RDW: 15.1 % (ref 11.5–15.5)
WBC Count: 1.3 10*3/uL — ABNORMAL LOW (ref 4.0–10.5)
WBC Morphology: 4
nRBC: 0 % (ref 0.0–0.2)

## 2021-12-04 LAB — PREPARE PLATELET PHERESIS: Unit division: 0

## 2021-12-04 LAB — SAMPLE TO BLOOD BANK

## 2021-12-04 LAB — MAGNESIUM: Magnesium: 2 mg/dL (ref 1.7–2.4)

## 2021-12-04 LAB — PREPARE RBC (CROSSMATCH)

## 2021-12-04 MED ORDER — SODIUM CHLORIDE 0.9% IV SOLUTION
250.0000 mL | Freq: Once | INTRAVENOUS | Status: AC
  Administered 2021-12-04: 250 mL via INTRAVENOUS

## 2021-12-04 MED ORDER — ACETAMINOPHEN 325 MG PO TABS
650.0000 mg | ORAL_TABLET | Freq: Once | ORAL | Status: AC
  Administered 2021-12-04: 650 mg via ORAL
  Filled 2021-12-04: qty 2

## 2021-12-04 NOTE — Telephone Encounter (Signed)
Per secure chat Lorriane Shire per Dr. Marin Olp (2) units of blood

## 2021-12-04 NOTE — Telephone Encounter (Signed)
Dr. Marin Olp notified of ANC-0.0.  No new orders received at this time.

## 2021-12-04 NOTE — Patient Instructions (Signed)
Thrombocytopenia ?Thrombocytopenia means that you have a low number of platelets in your blood. Platelets are tiny cells in the blood. When you bleed, they clump together at the cut or injury to stop the bleeding. This is called blood clotting. ?If you do not have enough platelets, your blood may have trouble clotting. This may cause you to bleed and bruise very easily. ?What are the causes? ?This condition is caused by a low number of platelets in your blood. There are three main reasons for this: ?Your body not making enough platelets. This may be caused by: ?Bone marrow diseases. ?Disorders that are passed from parent to child (inherited). ?Certain cancer medicines or treatments. ?Infection from germs (bacteria or viruses). ?Alcoholism. ?Platelets not being released in the blood. This can be caused by: ?Having a spleen that is larger than normal. ?A condition called Gaucher disease. ?Your body destroying platelets too quickly. This may be caused by: ?Certain autoimmune diseases. ?Some medicines that thin your blood. ?Certain blood clotting disorders. ?Certain bleeding disorders. ?Exposure to harmful (toxic) chemicals. ?Pregnancy. ?What are the signs or symptoms? ?Bruising easily. ?Bleeding from the nose or mouth. ?Heavy menstrual periods. ?Blood in the pee (urine), poop (stool), or vomit. ?A purple-red color to the skin (purpura). ?A rash that looks like pinpoint, purple-red spots (petechiae) on the lower legs. ?How is this treated? ?Treatment depends on the cause. Treatment may include: ?Treatment of another condition that is causing the low platelet count. ?Medicines to help protect your platelets from being destroyed. ?A replacement (transfusion) of platelets to stop or prevent bleeding. ?Surgery to take out the spleen. ?Follow these instructions at home: ?Medicines ?Take over-the-counter and prescription medicines only as told by your doctor. ?Do not take any medicines that have aspirin or NSAIDs, such as  ibuprofen. ?Activity ?Avoid doing things that could hurt or bruise you. Take action to prevent falls. ?Do not play contact sports. ?Ask your doctor what activities are safe for you. ?Take care not to burn yourself: ?When you use an iron. ?When you cook. ?Take care not to cut yourself: ?When you shave. ?When you use scissors, needles, knives, or other tools. ?General instructions ? ?Check your skin and the inside of your mouth for bruises or blood as told by your doctor. ?Wear a medical alert bracelet that says that you have a bleeding disorder. ?Check to see if there is blood in your pee and poop. Do this as told by your doctor. ?Do not drink alcohol. If you do drink, limit the amount that you drink. ?Stay away from harmful (toxic) chemicals. ?Tell all of your doctors that you have this condition. Be sure to tell your dentist and eye doctor. Tell your dentist about your condition before you have your teeth cleaned. ?Keep all follow-up visits. ?Contact a doctor if: ?You have bruises and you do not know why. ?You have new symptoms. ?You have symptoms that get worse. ?You have a fever. ?Get help right away if: ?You have very bad bleeding anywhere on your body. ?You have blood in your vomit, pee, or poop. ?You have an injury to your head. ?You have a sudden, very bad headache. ?Summary ?Thrombocytopenia means that you have a low number of platelets in your blood. ?Platelets stick together to form a clot. ?Symptoms of this condition include getting bruises easily, bleeding from the mouth and nose, a purple-red color to the skin, and a rash. ?Take care not to cut or burn yourself. ?This information is not intended to replace advice given   to you by your health care provider. Make sure you discuss any questions you have with your health care provider. ?Document Revised: 05/01/2021 Document Reviewed: 05/01/2021 ?Elsevier Patient Education ? 2022 [employer] ? ?

## 2021-12-05 ENCOUNTER — Encounter: Payer: Self-pay | Admitting: *Deleted

## 2021-12-05 LAB — PREPARE PLATELET PHERESIS: Unit division: 0

## 2021-12-05 LAB — BPAM RBC
Blood Product Expiration Date: 202302022359
ISSUE DATE / TIME: 202301051222
Unit Type and Rh: 5100

## 2021-12-05 LAB — BPAM PLATELET PHERESIS
Blood Product Expiration Date: 202301062359
ISSUE DATE / TIME: 202301050956
Unit Type and Rh: 6200

## 2021-12-05 LAB — TYPE AND SCREEN
ABO/RH(D): O POS
Antibody Screen: NEGATIVE
Unit division: 0

## 2021-12-06 LAB — PATHOLOGIST SMEAR REVIEW

## 2021-12-08 ENCOUNTER — Other Ambulatory Visit: Payer: PRIVATE HEALTH INSURANCE

## 2021-12-08 ENCOUNTER — Inpatient Hospital Stay: Payer: PRIVATE HEALTH INSURANCE

## 2021-12-10 ENCOUNTER — Telehealth: Payer: Self-pay | Admitting: *Deleted

## 2021-12-10 NOTE — Telephone Encounter (Signed)
Call received from patient stating that all of his care is being transferred to [facility] reasons and would like to cancel all of his appts here at Dr. Antonieta Pert office.  Appts canceled per pt.'s request and Dr. Marin Olp notified.

## 2021-12-11 ENCOUNTER — Inpatient Hospital Stay: Payer: PRIVATE HEALTH INSURANCE

## 2021-12-11 ENCOUNTER — Other Ambulatory Visit: Payer: PRIVATE HEALTH INSURANCE

## 2021-12-15 ENCOUNTER — Other Ambulatory Visit: Payer: PRIVATE HEALTH INSURANCE

## 2021-12-15 ENCOUNTER — Inpatient Hospital Stay: Payer: PRIVATE HEALTH INSURANCE

## 2021-12-18 ENCOUNTER — Other Ambulatory Visit: Payer: PRIVATE HEALTH INSURANCE

## 2021-12-22 ENCOUNTER — Other Ambulatory Visit: Payer: PRIVATE HEALTH INSURANCE

## 2021-12-22 ENCOUNTER — Ambulatory Visit: Payer: PRIVATE HEALTH INSURANCE | Admitting: Family

## 2021-12-22 ENCOUNTER — Ambulatory Visit: Payer: PRIVATE HEALTH INSURANCE

## 2021-12-23 ENCOUNTER — Ambulatory Visit: Payer: PRIVATE HEALTH INSURANCE

## 2021-12-23 NOTE — Progress Notes (Deleted)
Primary Physician/Referring:  Cathleen Corti, PA-C  Patient ID: Thomas Frazier, male    DOB: [birth year], 56 y.o.   MRN: [id number]  No chief complaint on file.  HPI:    Thomas Frazier  is a 56 y.o.  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  Patient denies significant cardiovascular risk factors.  No history of hypertension, hyperlipidemia, diabetes, no history of premature CAD. No known history of CAD.  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  Guideline directed medical therapy was initiated including carvedilol and Entresto, as well as digoxin and Lasix.  Patient responded well to diuresis.  Suspect severe aortic stenosis to be etiology of cardiomyopathy and had no significant troponin leak suspect this is chronic.  Patient presents for 4-week follow-up of heart failure and aortic stenosis.  Last visit added Corlanor 5 mg twice daily, however patient never started Corlanor due to cost.  He has been monitoring his heart rate at home and states it has been averaging 75 to 80 bpm.  Overall patient is feeling well.  States his energy level is significantly improved.  He is following with oncology and is presently responding well to treatment, he is also scheduled tentatively for bone marrow transplant in December.  Notably patient had repeat echocardiogram by [facility] that showed LVEF improved to 50-55%.  Denies chest pain, dizziness, syncope, near syncope, palpitations, orthopnea, PND.  He does continue to have mild dyspnea on exertion.  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  Cardiovascular:  Negative for chest pain, claudication, leg swelling, near-syncope, orthopnea, palpitations, paroxysmal nocturnal dyspnea and syncope.  Respiratory:  Negative for shortness of breath.   Neurological:  Negative for dizziness.   Objective  There were no vitals taken for this visit.  Vitals with BMI 12/04/2021 12/04/2021 12/04/2021  Height - - -  Weight - - -  BMI - - -  Systolic 867 672 99  Diastolic 60 65 57  Pulse 85 88 80      Physical Exam Vitals reviewed.  Constitutional:      Comments: Appears older than stated age.  Cardiovascular:     Rate and Rhythm: Normal rate and regular rhythm.     Pulses: Intact distal pulses.          Carotid pulses are  on the right side with bruit and  on the left side with bruit.    Heart sounds: S1 normal and S2 normal. Murmur  heard.  Harsh midsystolic murmur is present with a grade of 3/6 at the upper right sternal border radiating to the neck.    No gallop.  Pulmonary:     Effort: Pulmonary effort is normal.     Breath sounds: Normal breath sounds.  Musculoskeletal:     Right lower leg: No edema.     Left lower leg: No edema.  Neurological:     Mental Status: He is alert.    Laboratory examination:   Recent Labs    11/27/21 1010 12/02/21 1508 12/04/21 0846  NA 134* 130* 134*  K 4.1 3.8 4.1  CL  99 97* 99  CO2 27 23 29   GLUCOSE 78 126* 105*  BUN 23* 24* 21*  CREATININE 0.77 0.80 0.81  CALCIUM 10.0 8.8* 10.1  GFRNONAA >60 >60 >60    CrCl cannot be calculated (Unknown ideal weight.).  CMP Latest Ref Rng & Units 12/04/2021 12/02/2021 11/27/2021  Glucose 70 - 99 mg/dL 105(H) 126(H) 78  BUN 6 - 20 mg/dL 21(H) 24(H) 23(H)  Creatinine 0.61 - 1.24 mg/dL 0.81 0.80 0.77  Sodium 135 - 145 mmol/L 134(L) 130(L) 134(L)  Potassium 3.5 - 5.1 mmol/L 4.1 3.8 4.1  Chloride 98 - 111 mmol/L 99 97(L) 99  CO2 22 - 32 mmol/L 29 23 27   Calcium 8.9 - 10.3 mg/dL 10.1 8.8(L) 10.0  Total Protein 6.5 - 8.1 g/dL 8.0 8.2(H) 8.6(H)  Total Bilirubin 0.3 - 1.2 mg/dL 0.5 0.3 0.5  Alkaline Phos 38 - 126 U/L 89 93 96  AST 15 - 41 U/L 17 24 17   ALT 0 - 44 U/L 14 17 13    CBC Latest Ref Rng & Units 12/04/2021 12/02/2021 11/27/2021  WBC 4.0 - 10.5 K/uL 1.3(L) 2.0(L) 3.0(L)  Hemoglobin 13.0 - 17.0 g/dL 8.5(L) 8.7(L) 7.4(L)  Hematocrit 39.0 - 52.0 % 25.1(L) 25.2(L) 22.1(L)  Platelets 150 - 400 K/uL 13(L) 9(LL) 8(LL)    Lipid Panel No results for input(s): CHOL, TRIG, LDLCALC, VLDL, HDL, CHOLHDL, LDLDIRECT in the last 8760 hours.  HEMOGLOBIN A1C No results found for: HGBA1C, MPG TSH No results for input(s): TSH in the last 8760 hours.  External labs:  None   Allergies   Allergies  Allergen Reactions   Ciprofloxacin Rash   Famciclovir Rash    Tolerated acyclovir + fluconazole (see 10/23/2021 discharge) w/o symptoms   Fluconazole Rash    Tolerated acyclovir + fluconazole (see 10/23/2021 discharge) w/o symptoms    Medications Prior to Visit:   Outpatient Medications Prior to Visit  Medication Sig Dispense Refill   azaCITIDine (VIDAZA) 100 MG SUSR Vidaza 142.5 mg (rounded from 143.25 mg)=75 mg/m2 x 1.91 m2 daily times five days every twenty eight days 1 each 11   benzonatate (TESSALON) 100 MG capsule Take 1 capsule by mouth 3 (three) times daily as needed.     carvedilol (COREG) 3.125 MG tablet Take 1  tablet (3.125 mg total) by mouth 2 (two) times daily with a meal. 180 tablet 3   digoxin (LANOXIN) 0.25 MG tablet Take 1 tablet (0.25 mg total) by mouth daily. (Patient not taking: No sig reported) 90 tablet 3   ibuprofen (ADVIL) 200 MG tablet Take by mouth. Once Daily     levofloxacin (LEVAQUIN) 750 MG tablet Take 750 mg by mouth daily.     lidocaine-prilocaine (EMLA) cream Apply to affected area once 30 g 3   LORazepam (ATIVAN) 0.5 MG tablet Take 1 tablet (0.5 mg total) by mouth every 6 (six) hours  as needed (Nausea or vomiting). 30 tablet 0   Melatonin 10 MG TABS Take 10 mg by mouth at bedtime as needed.     Multiple Vitamins-Iron (MULTIVITAMIN/IRON PO) Take 1 tablet by mouth daily.     ondansetron (ZOFRAN) 8 MG tablet Take 1 tablet (8 mg total) by mouth 2 (two) times daily as needed (Nausea or vomiting). 30 tablet 1   prochlorperazine (COMPAZINE) 10 MG tablet Take 1 tablet (10 mg total) by mouth every 6 (six) hours as needed (Nausea or vomiting). 30 tablet 1   sacubitril-valsartan (ENTRESTO) 24-26 MG Take 1 tablet by mouth 2 (two) times daily. 180 tablet 3   traMADol (ULTRAM) 50 MG tablet Take 2 tablets (100 mg total) by mouth every 6 (six) hours as needed. 1-2 tablets (50mg - 100mg ) by mouth every 6 hours as needed 60 tablet 0   No facility-administered medications prior to visit.   Final Medications at End of Visit    No outpatient medications have been marked as taking for the 12/24/21 encounter (Appointment) with Rayetta Pigg, Jordann Grime C, PA-C.   Radiology:   No results found. CT angio chest 03/26/2021: 1. No pulmonary embolus. 2. Patchy, ground-glass, and confluent opacities in the right greater than left lower lobe. There also subpleural opacities involving the anterior left greater than right upper lobes. Findings are suspicious for multifocal pneumonia. Recommend radiographic follow-up to resolution. 3. Prominent right greater than left hilar nodes and prominent subcarinal node,  likely reactive. 4. Borderline cardiomegaly.  Coronary artery calcifications. Aortic Atherosclerosis (ICD10-I70.0).  Chest x-ray 03/29/2021: Persistent bibasilar opacities without substantial change. No significant pleural effusion. No pneumothorax. Stable cardiomediastinal contours.  Cardiac Studies:   Echocardiogram 03/27/2021:  1. Left ventricular ejection fraction, by estimation, is 45 to 50%. The  left ventricle has mildly decreased function. The left ventricle  demonstrates global hypokinesis. The left ventricular internal cavity size  was mildly dilated. Left ventricular  diastolic parameters are consistent with Grade II diastolic dysfunction  (pseudonormalization). Elevated left atrial pressure.   2. Right ventricular systolic function is normal. The right ventricular  size is mildly enlarged. There is mildly elevated pulmonary artery  systolic pressure. The estimated right ventricular systolic pressure is  08.1 mmHg.   3. Left atrial size was mildly dilated.   4. The mitral valve is grossly normal. Trivial mitral valve  regurgitation. No evidence of mitral stenosis.   5. The aortic valve is tricuspid. There is moderate calcification of the  aortic valve. There is moderate thickening of the aortic valve. Aortic  valve regurgitation is mild to moderate. Moderate aortic valve stenosis.  Aortic valve area, by VTI measures  1.28 cm. Aortic valve mean gradient measures 23.3 mmHg. Aortic valve Vmax  measures 3.12 m/s.   6. The inferior vena cava is normal in size with greater than 50%  respiratory variability, suggesting right atrial pressure of 3 mmHg.  Upon personal review by Dr. Einar Gip felt LVEF to be 30-35% as opposed to 45-50%.   Echocardiogram 06/24/2021 (external): There is normal left ventricular wall thickness. The left ventricular size is normal. LV ejection fraction = 50-55%. Left ventricular systolic function is normal. Left ventricular filling pattern is  pseudonormal. The left ventricular wall motion is normal. The right ventricle is normal in size and function. The left atrium is mildly dilated. The right atrium is mildly dilated. Diffuse thickening of the aortic valve with restricted cusp opening. There is moderate aortic stenosis. There is moderate aortic regurgitation. There is an eccentric jet of aortic  insufficiency directed against the anterior mitral leaflet. Due to the eccentric nature of the regurgitant jet, it may be under- estimated. The aortic sinus is normal size. The inferior vena cava is moderately dilated with a decrease in inspiratory collapse. There is no pericardial effusion. There is no comparison study available.  EKG:   EKG 04/11/2021: Sinus rhythm at a rate of 90 bpm.  Biatrial enlargement.  Normal axis.  LVH with secondary ST-T wave changes.  EKG 03/19/2021: Sinus tachycardia with single PVC at a rate of 106 bpm.  Normal axis.  Nonspecific T wave abnormality.  Assessment   No diagnosis found.    There are no discontinued medications.   No orders of the defined types were placed in this encounter.   Recommendations:   Thomas Frazier is a 56 y.o. 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  Patient denies significant cardiovascular risk factors.  No history of hypertension, hyperlipidemia, diabetes, no history of premature CAD. No known history of CAD.  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  Guideline directed medical therapy was initiated including carvedilol and Entresto, as well as digoxin and Lasix.  Patient responded well to diuresis.  Suspected severe aortic stenosis to be etiology  of cardiomyopathy and has no significant troponin leak suspect this is chronic.  Patient presents for 4-week follow-up of heart failure and aortic stenosis.  Last visit added Corlanor 5 mg twice daily, however patient did not start this due to high cost.  He has been monitoring heart rate at home which has improved since last visit.  We will therefore hold off on starting Corlanor at this time.  Patient overall is feeling much better.  Patient's blood pressure remains soft, however he is asymptomatic without recurrence of dizziness or lightheadedness.  LVEF has now normalized.  We will continue carvedilol, Entresto, and digoxin.  Discussed again with patient regarding aortic stenosis and possible surgical intervention.  We will discuss further with Dr. Einar Gip as well as patient's oncology team. For now will continue with medical optimization. There are no clinical signs of acute hart failure at this time.   Follow-up in 2 months, sooner if needed, for heart failure and aortic stenosis.   [name], PA-C 12/23/2021, 2:56 PM Office: [phone]  Addendum 07/09/21: Reviewed and discussed with Dr. Einar Gip. When we first saw patient in 02/2021 LVEF was down at 30-35% and felt he had severe low flow low gradient aortic stenosis. Since then patient has recovered well and has been optimized medically. Recent repeat echocardiogram at [facility] revealed LVEF has normalized and aortic stenosis and regurgitation are both moderate at most.   As patient's LVEF and symptoms have significantly improved will continue medical management. No indication for aortic valve intervention at this time. Will obtain stress test and if it is low risk patient may proceed with bone marrow transplant at low risk.    [name], PA-C 12/23/2021, 2:56 PM Office: [phone]

## 2021-12-24 ENCOUNTER — Ambulatory Visit: Payer: PRIVATE HEALTH INSURANCE

## 2021-12-24 ENCOUNTER — Ambulatory Visit: Payer: Self-pay | Admitting: Student

## 2021-12-25 ENCOUNTER — Ambulatory Visit: Payer: PRIVATE HEALTH INSURANCE

## 2021-12-25 ENCOUNTER — Other Ambulatory Visit: Payer: PRIVATE HEALTH INSURANCE

## 2021-12-26 ENCOUNTER — Ambulatory Visit: Payer: PRIVATE HEALTH INSURANCE

## 2021-12-29 ENCOUNTER — Other Ambulatory Visit: Payer: PRIVATE HEALTH INSURANCE

## 2022-01-01 ENCOUNTER — Other Ambulatory Visit: Payer: PRIVATE HEALTH INSURANCE

## 2022-01-05 ENCOUNTER — Other Ambulatory Visit: Payer: PRIVATE HEALTH INSURANCE

## 2022-01-08 ENCOUNTER — Other Ambulatory Visit: Payer: PRIVATE HEALTH INSURANCE

## 2022-01-12 ENCOUNTER — Other Ambulatory Visit: Payer: PRIVATE HEALTH INSURANCE

## 2022-01-15 ENCOUNTER — Other Ambulatory Visit: Payer: PRIVATE HEALTH INSURANCE

## 2022-01-19 ENCOUNTER — Other Ambulatory Visit: Payer: PRIVATE HEALTH INSURANCE

## 2022-01-22 ENCOUNTER — Other Ambulatory Visit: Payer: PRIVATE HEALTH INSURANCE

## 2022-01-26 ENCOUNTER — Other Ambulatory Visit: Payer: PRIVATE HEALTH INSURANCE

## 2022-01-29 ENCOUNTER — Other Ambulatory Visit: Payer: PRIVATE HEALTH INSURANCE

## 2022-02-11 ENCOUNTER — Encounter: Payer: Self-pay | Admitting: Hematology & Oncology

## 2022-02-20 ENCOUNTER — Encounter: Payer: Self-pay | Admitting: Hematology & Oncology

## 2022-06-22 ENCOUNTER — Other Ambulatory Visit: Payer: Self-pay

## 2022-08-07 ENCOUNTER — Encounter: Payer: Self-pay | Admitting: Hematology & Oncology

## 2022-11-30 ENCOUNTER — Other Ambulatory Visit: Payer: Self-pay | Admitting: Hematology & Oncology

## 2022-11-30 DIAGNOSIS — D649 Anemia, unspecified: Secondary | ICD-10-CM

## 2022-11-30 DIAGNOSIS — D696 Thrombocytopenia, unspecified: Secondary | ICD-10-CM

## 2022-11-30 DIAGNOSIS — D46Z Other myelodysplastic syndromes: Secondary | ICD-10-CM
